# Patient Record
Sex: Female | Born: 1970 | Race: Black or African American | Hispanic: No | Marital: Married | Smoking: Never smoker
Health system: Southern US, Community
[De-identification: ages and names within clinical notes are randomized; demographics above are authoritative.]

## PROBLEM LIST (undated history)

## (undated) DIAGNOSIS — E215 Disorder of parathyroid gland, unspecified: Secondary | ICD-10-CM

## (undated) DIAGNOSIS — G939 Disorder of brain, unspecified: Secondary | ICD-10-CM

## (undated) DIAGNOSIS — D649 Anemia, unspecified: Secondary | ICD-10-CM

## (undated) DIAGNOSIS — K219 Gastro-esophageal reflux disease without esophagitis: Secondary | ICD-10-CM

## (undated) DIAGNOSIS — R519 Headache, unspecified: Secondary | ICD-10-CM

## (undated) DIAGNOSIS — R200 Anesthesia of skin: Secondary | ICD-10-CM

## (undated) DIAGNOSIS — E213 Hyperparathyroidism, unspecified: Secondary | ICD-10-CM

## (undated) DIAGNOSIS — F419 Anxiety disorder, unspecified: Secondary | ICD-10-CM

## (undated) DIAGNOSIS — E785 Hyperlipidemia, unspecified: Secondary | ICD-10-CM

## (undated) HISTORY — PX: WISDOM TOOTH EXTRACTION: SHX21

## (undated) HISTORY — PX: UPPER GI ENDOSCOPY: SHX6162

## (undated) HISTORY — DX: Anemia, unspecified: D64.9

## (undated) HISTORY — DX: Hyperlipidemia, unspecified: E78.5

## (undated) HISTORY — PX: COLONOSCOPY: SHX174

---
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
## 1999-12-13 ENCOUNTER — Emergency Department (HOSPITAL_COMMUNITY): Admission: EM | Admit: 1999-12-13 | Discharge: 1999-12-13 | Payer: Self-pay | Admitting: Emergency Medicine

## 1999-12-17 ENCOUNTER — Emergency Department (HOSPITAL_COMMUNITY): Admission: EM | Admit: 1999-12-17 | Discharge: 1999-12-17 | Payer: Self-pay

## 2001-02-08 ENCOUNTER — Other Ambulatory Visit: Admission: RE | Admit: 2001-02-08 | Discharge: 2001-02-08 | Payer: Self-pay | Admitting: Obstetrics and Gynecology

## 2001-02-27 ENCOUNTER — Encounter: Admission: RE | Admit: 2001-02-27 | Discharge: 2001-02-27 | Payer: Self-pay | Admitting: Obstetrics and Gynecology

## 2001-02-27 ENCOUNTER — Encounter: Payer: Self-pay | Admitting: Obstetrics and Gynecology

## 2002-04-19 ENCOUNTER — Other Ambulatory Visit: Admission: RE | Admit: 2002-04-19 | Discharge: 2002-04-19 | Payer: Self-pay | Admitting: Obstetrics and Gynecology

## 2003-04-02 ENCOUNTER — Emergency Department (HOSPITAL_COMMUNITY): Admission: EM | Admit: 2003-04-02 | Discharge: 2003-04-02 | Payer: Self-pay | Admitting: Emergency Medicine

## 2003-04-03 ENCOUNTER — Encounter: Payer: Self-pay | Admitting: Emergency Medicine

## 2003-04-24 ENCOUNTER — Other Ambulatory Visit: Admission: RE | Admit: 2003-04-24 | Discharge: 2003-04-24 | Payer: Self-pay | Admitting: Obstetrics and Gynecology

## 2004-05-13 ENCOUNTER — Other Ambulatory Visit: Admission: RE | Admit: 2004-05-13 | Discharge: 2004-05-13 | Payer: Self-pay | Admitting: Obstetrics and Gynecology

## 2014-12-20 ENCOUNTER — Ambulatory Visit (INDEPENDENT_AMBULATORY_CARE_PROVIDER_SITE_OTHER): Payer: 59 | Admitting: Family Medicine

## 2014-12-20 ENCOUNTER — Encounter: Payer: Self-pay | Admitting: Family Medicine

## 2014-12-20 VITALS — BP 119/78 | HR 85 | Temp 97.9°F | Resp 16 | Ht 64.5 in | Wt 170.0 lb

## 2014-12-20 DIAGNOSIS — R079 Chest pain, unspecified: Secondary | ICD-10-CM

## 2014-12-20 DIAGNOSIS — D509 Iron deficiency anemia, unspecified: Secondary | ICD-10-CM

## 2014-12-20 DIAGNOSIS — Z862 Personal history of diseases of the blood and blood-forming organs and certain disorders involving the immune mechanism: Secondary | ICD-10-CM | POA: Insufficient documentation

## 2014-12-20 DIAGNOSIS — K219 Gastro-esophageal reflux disease without esophagitis: Secondary | ICD-10-CM

## 2014-12-20 DIAGNOSIS — D259 Leiomyoma of uterus, unspecified: Secondary | ICD-10-CM

## 2014-12-20 DIAGNOSIS — R5383 Other fatigue: Secondary | ICD-10-CM

## 2014-12-20 LAB — COMPLETE METABOLIC PANEL WITH GFR
ALT: 12 U/L (ref 0–35)
AST: 13 U/L (ref 0–37)
Albumin: 4 g/dL (ref 3.5–5.2)
Alkaline Phosphatase: 67 U/L (ref 39–117)
BUN: 8 mg/dL (ref 6–23)
CO2: 24 mEq/L (ref 19–32)
Calcium: 10.3 mg/dL (ref 8.4–10.5)
Chloride: 106 mEq/L (ref 96–112)
Creat: 0.98 mg/dL (ref 0.50–1.10)
GFR, Est African American: 82 mL/min
GFR, Est Non African American: 71 mL/min
Glucose, Bld: 80 mg/dL (ref 70–99)
Potassium: 4.9 mEq/L (ref 3.5–5.3)
Sodium: 137 mEq/L (ref 135–145)
Total Bilirubin: 0.3 mg/dL (ref 0.2–1.2)
Total Protein: 7.1 g/dL (ref 6.0–8.3)

## 2014-12-20 LAB — POCT CBC
Granulocyte percent: 64 %G (ref 37–80)
HCT, POC: 33.2 % — AB (ref 37.7–47.9)
Hemoglobin: 10 g/dL — AB (ref 12.2–16.2)
Lymph, poc: 1.3 (ref 0.6–3.4)
MCH, POC: 22.8 pg — AB (ref 27–31.2)
MCHC: 30.3 g/dL — AB (ref 31.8–35.4)
MCV: 75.3 fL — AB (ref 80–97)
MID (cbc): 0.1 (ref 0–0.9)
MPV: 7.2 fL (ref 0–99.8)
POC Granulocyte: 2.6 (ref 2–6.9)
POC LYMPH PERCENT: 32.9 %L (ref 10–50)
POC MID %: 3.1 %M (ref 0–12)
Platelet Count, POC: 333 10*3/uL (ref 142–424)
RBC: 4.4 M/uL (ref 4.04–5.48)
RDW, POC: 16.4 %
WBC: 4 10*3/uL — AB (ref 4.6–10.2)

## 2014-12-20 MED ORDER — POLYSACCHARIDE IRON COMPLEX 150 MG PO CAPS: 150.0000 mg | ORAL_CAPSULE | Freq: Every day | ORAL | Status: DC

## 2014-12-20 MED ORDER — TRAMADOL HCL 50 MG PO TABS: 50.0000 mg | ORAL_TABLET | Freq: Three times a day (TID) | ORAL | Status: DC | PRN

## 2014-12-20 MED ORDER — OMEPRAZOLE 20 MG PO CPDR: 20.0000 mg | DELAYED_RELEASE_CAPSULE | Freq: Every day | ORAL | Status: DC

## 2014-12-20 NOTE — Progress Notes (Signed)
Subjective:    Patient ID: Ana Freeman, female    DOB: September 07, 1971, 44 y.o.   MRN: [id number]  HPI  This 44 y.o. Female is new to [facility]; she recently moved to [place] from [place]. PCP in [place] was seen for 1-2 visits. Pt has hx of anemia due to uterine fibroids (total = 4?) and was advised to have further evaluation. Iron supplementation was advised but she has not been taking iron; c/o GI upset with OTC product. Pt did not follow through w/ this recommendation. Today, she c/o palpitations and occasional CP (L anterior chest w/ radiation into arm), SOB and DOE. Onset > 3 months ago. Pt exercises regularly (ZUMBA) but is fatigued afterwards; she pushes through the work-out despite lack of energy or stamina.  Menses are abnormal, lasting 7-9 days w/ heavy bleeding x 3-4 days. She c/o urinary frequency and urgency w/ bladder pressure due to fibroids. She has never been pregnant. No family hx of uterine disease/cancer.  Pt also has significant hx of GERD, with ulcer in remote past. 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 She denies anorexia, abnormal weight loss, n/v, change in stools, melena or hematochezia.  Patient Active Problem List   Diagnosis Date Noted  . Anemia, iron deficiency 12/20/2014    MEDICATIONS, PMHx, SURG Hx reviewed.  History   Social History  . Marital Status: Single    Spouse Name: N/A    Number of Children: N/A  . Years of Education: N/A   Occupational History  . Currently not employed.   Social History Main Topics  . Smoking status: Never Smoker   . Smokeless tobacco: Not on file  . Alcohol Use: 0.0 oz/week    0 Not specified per week  . Drug Use: No  . Sexual Activity: No   Other Topics Concern  . Not on file   Social History Narrative   Student; lives with mother    Family History  Problem Relation Age of Onset  . Hypertension Mother   . Diabetes Father   . Anemia Sister       Review of Systems  Constitutional: Positive for fatigue. Negative for fever, diaphoresis, activity change, appetite change and unexpected weight change.  HENT: Negative.   Eyes: Negative.   Respiratory: Positive for chest tightness and shortness of breath. Negative for cough and wheezing.   Cardiovascular: Positive for chest pain and palpitations. Negative for leg swelling.  Gastrointestinal: Negative.   Genitourinary: Positive for urgency, frequency, menstrual problem and pelvic pain. Negative for dysuria, hematuria, decreased urine volume, enuresis, difficulty urinating and vaginal pain.  Neurological: Positive for light-headedness. Negative for dizziness, syncope and headaches.  Psychiatric/Behavioral: Negative.        Objective:   Physical Exam  Constitutional: She is oriented to person, place, and time. She appears well-developed and well-nourished. No distress.  HENT:  Head: Normocephalic and atraumatic.  Right Ear: External ear normal.  Left Ear: External ear normal.  Nose: No nasal deformity or septal deviation.  Mouth/Throat: Uvula is midline, oropharynx is clear and moist and mucous membranes are normal. No oral lesions. No dental caries.  Eyes: Conjunctivae, EOM and lids are normal. Pupils are equal, round, and reactive to light. No scleral icterus.  Neck: Trachea normal, normal range of motion, full passive range of motion without pain and phonation normal. Neck supple. No thyroid mass and no thyromegaly present.  Cardiovascular: Normal rate,  regular rhythm, S1 normal, S2 normal, normal heart sounds and normal pulses.   No extrasystoles are present. PMI is not displaced.  Exam reveals no gallop.   No murmur heard. Pulmonary/Chest: Breath sounds normal. No respiratory distress.  Abdominal: Soft. Normal appearance and bowel sounds are normal. She exhibits mass. She exhibits no distension. There is no hepatosplenomegaly. There is tenderness in the suprapubic area. There is  no guarding and no CVA tenderness.  Uterus palpable at umbilicus; firm w/o irregularity.  Musculoskeletal: Normal range of motion. She exhibits no edema or tenderness.  Neurological: She is alert and oriented to person, place, and time. No cranial nerve deficit. Coordination normal.  Skin: Skin is warm and dry. No rash noted. She is not diaphoretic. No erythema.  Psychiatric: She has a normal mood and affect. Her behavior is normal. Judgment and thought content normal.  Nursing note and vitals reviewed.   Results for orders placed or performed in visit on 12/20/14  POCT CBC  Result Value Ref Range   WBC 4.0 (A) 4.6 - 10.2 K/uL   Lymph, poc 1.3 0.6 - 3.4   POC LYMPH PERCENT 32.9 10 - 50 %L   MID (cbc) 0.1 0 - 0.9   POC MID % 3.1 0 - 12 %M   POC Granulocyte 2.6 2 - 6.9   Granulocyte percent 64.0 37 - 80 %G   RBC 4.40 4.04 - 5.48 M/uL   Hemoglobin 10.0 (A) 12.2 - 16.2 g/dL   HCT, POC 33.2 (A) 37.7 - 47.9 %   MCV 75.3 (A) 80 - 97 fL   MCH, POC 22.8 (A) 27 - 31.2 pg   MCHC 30.3 (A) 31.8 - 35.4 g/dL   RDW, POC 16.4 %   Platelet Count, POC 333 142 - 424 K/uL   MPV 7.2 0 - 99.8 fL    ECG: NSR; negative T waves >> anterior ischemia.     Assessment & Plan:  Anemia, iron deficiency - GI-friendly iron supplement prescribed (Niferex 150 mg 1 cap daily) Plan: Ambulatory referral to Gynecology, POCT CBC, COMPLETE METABOLIC PANEL WITH GFR  Chest pain, unspecified chest pain type - Plan: EKG 12-Lead, TSH, T4, free, Ambulatory referral to Cardiology  Other fatigue - Plan: POCT CBC, TSH, T4, free, COMPLETE METABOLIC PANEL WITH GFR, Ambulatory referral to Cardiology  Uterine leiomyoma, unspecified location - Plan: Ambulatory referral to Gynecology  Gastroesophageal reflux disease, esophagitis presence not specified- Discontinue all NSAIDS! Trial PPI- omeprazole 20 mg daily.  Meds ordered this encounter  Medications  . iron polysaccharides (NIFEREX) 150 MG capsule    Sig: Take 1 capsule  (150 mg total) by mouth daily.    Dispense:  30 capsule    Refill:  3  . omeprazole (PRILOSEC) 20 MG capsule    Sig: Take 1 capsule (20 mg total) by mouth daily.    Dispense:  30 capsule    Refill:  3  . traMADol (ULTRAM) 50 MG tablet    Sig: Take 1 tablet (50 mg total) by mouth every 8 (eight) hours as needed.    Dispense:  40 tablet    Refill:  1

## 2014-12-20 NOTE — Patient Instructions (Signed)
Anemia, Nonspecific Anemia is a condition in which the concentration of red blood cells or hemoglobin in the blood is below normal. Hemoglobin is a substance in red blood cells that carries oxygen to the tissues of the body. Anemia results in not enough oxygen reaching these tissues.  CAUSES  Common causes of anemia include:   Excessive bleeding. Bleeding may be internal or external. This includes excessive bleeding from periods (in women) or from the intestine.   Poor nutrition.   Chronic kidney, thyroid, and liver disease.  Bone marrow disorders that decrease red blood cell production.  Cancer and treatments for cancer.  HIV, AIDS, and their treatments.  Spleen problems that increase red blood cell destruction.  Blood disorders.  Excess destruction of red blood cells due to infection, medicines, and autoimmune disorders. SIGNS AND SYMPTOMS   Minor weakness.   Dizziness.   Headache.  Palpitations.   Shortness of breath, especially with exercise.   Paleness.  Cold sensitivity.  Indigestion.  Nausea.  Difficulty sleeping.  Difficulty concentrating. Symptoms may occur suddenly or they may develop slowly.  DIAGNOSIS  Additional blood tests are often needed. These help your health care provider determine the best treatment. Your health care provider will check your stool for blood and look for other causes of blood loss.  TREATMENT  Treatment varies depending on the cause of the anemia. Treatment can include:   Supplements of iron, vitamin I96, or folic acid.   Hormone medicines.   A blood transfusion. This may be needed if blood loss is severe.   Hospitalization. This may be needed if there is significant continual blood loss.   Dietary changes.  Spleen removal. HOME CARE INSTRUCTIONS Keep all follow-up appointments. It often takes many weeks to correct anemia, and having your health care provider check on your condition and your response to  treatment is very important. SEEK IMMEDIATE MEDICAL CARE IF:   You develop extreme weakness, shortness of breath, or chest pain.   You become dizzy or have trouble concentrating.  You develop heavy vaginal bleeding.   You develop a rash.   You have bloody or black, tarry stools.   You faint.   You vomit up blood.   You vomit repeatedly.   You have abdominal pain.  You have a fever or persistent symptoms for more than 2-3 days.   You have a fever and your symptoms suddenly get worse.   You are dehydrated.  MAKE SURE YOU:  Understand these instructions.  Will watch your condition.  Will get help right away if you are not doing well or get worse. Document Released: 01/06/2005 Document Revised: 08/01/2013 Document Reviewed: 05/25/2013 [facility] Patient Information 2015 [place], [place]. This information is not intended to replace advice given to you by your health care provider. Make sure you discuss any questions you have with your health care provider.   Food Choices for Gastroesophageal Reflux Disease When you have gastroesophageal reflux disease (GERD), the foods you eat and your eating habits are very important. Choosing the right foods can help ease the discomfort of GERD. WHAT GENERAL GUIDELINES DO I NEED TO FOLLOW?  Choose fruits, vegetables, whole grains, low-fat dairy products, and low-fat meat, fish, and poultry.  Limit fats such as oils, salad dressings, butter, nuts, and avocado.  Keep a food diary to identify foods that cause symptoms.  Avoid foods that cause reflux. These may be different for different people.  Eat frequent small meals instead of three large meals each day.  Eat your meals slowly, in a relaxed setting.  Limit fried foods.  Cook foods using methods other than frying.  Avoid drinking alcohol.  Avoid drinking large amounts of liquids with your meals.  Avoid bending over or lying down until 2-3 hours after eating. WHAT  FOODS ARE NOT RECOMMENDED? The following are some foods and drinks that may worsen your symptoms: Vegetables Tomatoes. Tomato juice. Tomato and spaghetti sauce. Chili peppers. Onion and garlic. Horseradish. Fruits Oranges, grapefruit, and lemon (fruit and juice). Meats High-fat meats, fish, and poultry. This includes hot dogs, ribs, ham, sausage, salami, and bacon. Dairy Whole milk and chocolate milk. Sour cream. Cream. Butter. Ice cream. Cream cheese.  Beverages Coffee and tea, with or without caffeine. Carbonated beverages or energy drinks. Condiments Hot sauce. Barbecue sauce.  Sweets/Desserts Chocolate and cocoa. Donuts. Peppermint and spearmint. Fats and Oils High-fat foods, including [place] fries and potato chips. Other Vinegar. Strong spices, such as black pepper, white pepper, red pepper, cayenne, curry powder, cloves, ginger, and chili powder. The items listed above may not be a complete list of foods and beverages to avoid. Contact your dietitian for more information. Document Released: 11/29/2005 Document Revised: 12/04/2013 Document Reviewed: 10/03/2013 [facility] Patient Information 2015 [place], [place]. This information is not intended to replace advice given to you by your health care provider. Make sure you discuss any questions you have with your health care provider.   Discontinue any medications like Advil, Ibuprofen or Aleve. These medications worsen reflux and can cuase irritation of the esophagus and stomach. This irritation can cause chest pain and worse anemia.

## 2014-12-21 LAB — TSH: TSH: 0.737 u[IU]/mL (ref 0.350–4.500)

## 2014-12-21 LAB — T4, FREE: Free T4: 1.37 ng/dL (ref 0.80–1.80)

## 2014-12-22 NOTE — Progress Notes (Signed)
Quick Note:  Please notify pt that results are normal.   Provide pt with copy of labs. ______ 

## 2015-01-17 ENCOUNTER — Ambulatory Visit (INDEPENDENT_AMBULATORY_CARE_PROVIDER_SITE_OTHER): Payer: 59 | Admitting: Family Medicine

## 2015-01-17 ENCOUNTER — Encounter: Payer: Self-pay | Admitting: Family Medicine

## 2015-01-17 VITALS — BP 116/60 | HR 73 | Temp 98.3°F | Resp 16 | Ht 65.0 in | Wt 174.0 lb

## 2015-01-17 DIAGNOSIS — D509 Iron deficiency anemia, unspecified: Secondary | ICD-10-CM

## 2015-01-17 DIAGNOSIS — D259 Leiomyoma of uterus, unspecified: Secondary | ICD-10-CM | POA: Insufficient documentation

## 2015-01-17 DIAGNOSIS — Z13220 Encounter for screening for lipoid disorders: Secondary | ICD-10-CM

## 2015-01-17 DIAGNOSIS — R202 Paresthesia of skin: Secondary | ICD-10-CM

## 2015-01-17 LAB — LIPID PANEL
Cholesterol: 184 mg/dL (ref 0–200)
HDL: 60 mg/dL (ref >39)
LDL Cholesterol: 113 mg/dL — ABNORMAL HIGH (ref 0–99)
Total CHOL/HDL Ratio: 3.1 Ratio
Triglycerides: 57 mg/dL (ref <150)
VLDL: 11 mg/dL (ref 0–40)

## 2015-01-17 LAB — CBC
HCT: 32 % — ABNORMAL LOW (ref 36.0–46.0)
Hemoglobin: 9.5 g/dL — ABNORMAL LOW (ref 12.0–15.0)
MCH: 22.6 pg — ABNORMAL LOW (ref 26.0–34.0)
MCHC: 29.7 g/dL — ABNORMAL LOW (ref 30.0–36.0)
MCV: 76.2 fL — ABNORMAL LOW (ref 78.0–100.0)
MPV: 9.2 fL (ref 8.6–12.4)
Platelets: 316 10*3/uL (ref 150–400)
RBC: 4.2 MIL/uL (ref 3.87–5.11)
RDW: 16.6 % — ABNORMAL HIGH (ref 11.5–15.5)
WBC: 3.4 10*3/uL — ABNORMAL LOW (ref 4.0–10.5)

## 2015-01-17 NOTE — Patient Instructions (Signed)
Your labs will be available in 4-5 days. Follow-up with Dr. Irven Shelling office as directed. Our staff will be contacting you with the details of GYN appointment.

## 2015-01-17 NOTE — Progress Notes (Signed)
S:  This 44 y.o. Female returns for anemia follow-up. Iron-def anemia due to fibroid uterus; pt has not been contacted about GYN appt. She has hx of atypical CP, evaluated last month by Dr. Einar Gip. She will be scheduled for ECHO and stress test. Pt needs to have lipid panel per Dr. Einar Gip. Pt is tolerating oral iron supplement w/o GI upset. She feels better and has more energy. Last menstrual cycle was shorter (5 days) and much less painful.; pt is hopeful that fibroid may be shrinking.  Pt still c/o L arm and leg paresthesias.  Patient Active Problem List   Diagnosis Date Noted  . Fibroid uterus 01/17/2015  . Anemia, iron deficiency 12/20/2014    Prior to Admission medications   Medication Sig Start Date End Date Taking? Authorizing Provider  iron polysaccharides (NIFEREX) 150 MG capsule Take 1 capsule (150 mg total) by mouth daily. 12/20/14  Yes Barton Fanny, MD  omeprazole (PRILOSEC) 20 MG capsule Take 1 capsule (20 mg total) by mouth daily. 12/20/14  Yes Barton Fanny, MD    Past Surgical History  Procedure Laterality Date  . Wisdom tooth extraction      ROS: As per HPI; negative for anorexia, diaphoresis, fatigue, CP or tightness, palpitations, SOB or DOE, abd pain, hematochezia or melena, HA, dizziness, weakness or syncope.  O: Filed Vitals:   01/17/15 1128  BP: 116/60  Pulse: 73  Temp: 98.3 F (36.8 C)  Resp: 16   GEN: in NAD; WN,WD. HENT: [place]/AT; EOMI w/ clear conj/sclerae. Moist oral mucosa. COR: RRR. No edema. LUNGS: Unlabored resp. SKIN: W&D; intact w/o erythema or jaundice. MS: MAEs; no deformities or atrophy. NEURO: A&O x 3; CNs intact. Nonfocal.  A/P: Anemia, iron deficiency - Continue iron supplement. Awaiting GYN referral for eval of fibroids.  Plan: CBC  Paresthesia - Plan: Vitamin D, 25-hydroxy  Screening for lipid disorders - Plan: Lipid panel

## 2015-01-18 LAB — VITAMIN D 25 HYDROXY (VIT D DEFICIENCY, FRACTURES): Vit D, 25-Hydroxy: 36 ng/mL (ref 30–100)

## 2015-01-19 ENCOUNTER — Encounter: Payer: Self-pay | Admitting: Family Medicine

## 2015-01-19 ENCOUNTER — Telehealth: Payer: Self-pay | Admitting: Family Medicine

## 2015-01-19 NOTE — Telephone Encounter (Signed)
I phoned pt and left a message to advise her to take iron supplement twice daily to help correct anemia.

## 2015-01-19 NOTE — Progress Notes (Signed)
Quick Note:  Please send pt copy of labs...  All labs are normal except anemia has not improved. Take iron supplement twice a day. Have your pharmacy contact us for early refill on iron prescription; you will run out of the medication sooner because of twice daily dosing.  Contact clinic if you have questions.  ______

## 2015-01-20 ENCOUNTER — Encounter: Payer: Self-pay | Admitting: Family Medicine

## 2015-02-11 ENCOUNTER — Encounter: Payer: Self-pay | Admitting: Family Medicine

## 2015-02-11 DIAGNOSIS — R9431 Abnormal electrocardiogram [ECG] [EKG]: Secondary | ICD-10-CM | POA: Insufficient documentation

## 2015-02-14 ENCOUNTER — Telehealth: Payer: Self-pay

## 2015-02-14 NOTE — Telephone Encounter (Signed)
Patient faxed a [facility] PE form for Dr. Leward Quan to complete. Form forwarded to 104.

## 2015-02-19 NOTE — Telephone Encounter (Signed)
Form completed and pt contacted.

## 2015-02-19 NOTE — Telephone Encounter (Signed)
Forms were given to me by Dr. Leward Quan. I faxed over the forms as instructed on the completion form. I left a voicemail stating I faxed the forms as requested. I will place the forms in the scanning box at 104.

## 2015-03-10 ENCOUNTER — Encounter: Payer: Self-pay | Admitting: Family Medicine

## 2015-04-05 ENCOUNTER — Other Ambulatory Visit: Payer: Self-pay | Admitting: Family Medicine

## 2015-05-26 ENCOUNTER — Ambulatory Visit (INDEPENDENT_AMBULATORY_CARE_PROVIDER_SITE_OTHER): Payer: 59 | Admitting: Family Medicine

## 2015-05-26 VITALS — BP 124/80 | HR 90 | Temp 99.3°F | Resp 18 | Ht 65.0 in | Wt 175.4 lb

## 2015-05-26 DIAGNOSIS — J0100 Acute maxillary sinusitis, unspecified: Secondary | ICD-10-CM | POA: Diagnosis not present

## 2015-05-26 DIAGNOSIS — H109 Unspecified conjunctivitis: Secondary | ICD-10-CM

## 2015-05-26 MED ORDER — AMOXICILLIN 875 MG PO TABS: 875.0000 mg | ORAL_TABLET | Freq: Two times a day (BID) | ORAL | Status: DC

## 2015-05-26 MED ORDER — TOBRAMYCIN 0.3 % OP SOLN: 1.0000 [drp] | Freq: Four times a day (QID) | OPHTHALMIC | Status: DC

## 2015-05-26 NOTE — Patient Instructions (Signed)
Bacterial Conjunctivitis Bacterial conjunctivitis, commonly called pink eye, is an inflammation of the clear membrane that covers the white part of the eye (conjunctiva). The inflammation can also happen on the underside of the eyelids. The blood vessels in the conjunctiva become inflamed, causing the eye to become red or pink. Bacterial conjunctivitis may spread easily from one eye to another and from person to person (contagious).  CAUSES  Bacterial conjunctivitis is caused by bacteria. The bacteria may come from your own skin, your upper respiratory tract, or from someone else with bacterial conjunctivitis. SYMPTOMS  The normally white color of the eye or the underside of the eyelid is usually pink or red. The pink eye is usually associated with irritation, tearing, and some sensitivity to light. Bacterial conjunctivitis is often associated with a thick, yellowish discharge from the eye. The discharge may turn into a crust on the eyelids overnight, which causes your eyelids to stick together. If a discharge is present, there may also be some blurred vision in the affected eye. DIAGNOSIS  Bacterial conjunctivitis is diagnosed by your caregiver through an eye exam and the symptoms that you report. Your caregiver looks for changes in the surface tissues of your eyes, which may point to the specific type of conjunctivitis. A sample of any discharge may be collected on a cotton-tip swab if you have a severe case of conjunctivitis, if your cornea is affected, or if you keep getting repeat infections that do not respond to treatment. The sample will be sent to a lab to see if the inflammation is caused by a bacterial infection and to see if the infection will respond to antibiotic medicines. TREATMENT   Bacterial conjunctivitis is treated with antibiotics. Antibiotic eyedrops are most often used. However, antibiotic ointments are also available. Antibiotics pills are sometimes used. Artificial tears or eye  washes may ease discomfort. HOME CARE INSTRUCTIONS   To ease discomfort, apply a cool, clean washcloth to your eye for 10-20 minutes, 3-4 times a day.  Gently wipe away any drainage from your eye with a warm, wet washcloth or a cotton ball.  Wash your hands often with soap and water. Use paper towels to dry your hands.  Do not share towels or washcloths. This may spread the infection.  Change or wash your pillowcase every day.  You should not use eye makeup until the infection is gone.  Do not operate machinery or drive if your vision is blurred.  Stop using contact lenses. Ask your caregiver how to sterilize or replace your contacts before using them again. This depends on the type of contact lenses that you use.  When applying medicine to the infected eye, do not touch the edge of your eyelid with the eyedrop bottle or ointment tube. SEEK IMMEDIATE MEDICAL CARE IF:   Your infection has not improved within 3 days after beginning treatment.  You had yellow discharge from your eye and it returns.  You have increased eye pain.  Your eye redness is spreading.  Your vision becomes blurred.  You have a fever or persistent symptoms for more than 2-3 days.  You have a fever and your symptoms suddenly get worse.  You have facial pain, redness, or swelling. MAKE SURE YOU:   Understand these instructions.  Will watch your condition.  Will get help right away if you are not doing well or get worse. Document Released: 11/29/2005 Document Revised: 04/15/2014 Document Reviewed: 05/01/2012 [employer] Patient Information 2015 [employer]. This information is not intended to   replace advice given to you by your health care provider. Make sure you discuss any questions you have with your health care provider. Sinusitis Sinusitis is redness, soreness, and inflammation of the paranasal sinuses. Paranasal sinuses are air pockets within the bones of your face (beneath the eyes, the middle  of the forehead, or above the eyes). In healthy paranasal sinuses, mucus is able to drain out, and air is able to circulate through them by way of your nose. However, when your paranasal sinuses are inflamed, mucus and air can become trapped. This can allow bacteria and other germs to grow and cause infection. Sinusitis can develop quickly and last only a short time (acute) or continue over a long period (chronic). Sinusitis that lasts for more than 12 weeks is considered chronic.  CAUSES  Causes of sinusitis include:  Allergies.  Structural abnormalities, such as displacement of the cartilage that separates your nostrils (deviated septum), which can decrease the air flow through your nose and sinuses and affect sinus drainage.  Functional abnormalities, such as when the small hairs (cilia) that line your sinuses and help remove mucus do not work properly or are not present. SIGNS AND SYMPTOMS  Symptoms of acute and chronic sinusitis are the same. The primary symptoms are pain and pressure around the affected sinuses. Other symptoms include:  Upper toothache.  Earache.  Headache.  Bad breath.  Decreased sense of smell and taste.  A cough, which worsens when you are lying flat.  Fatigue.  Fever.  Thick drainage from your nose, which often is green and may contain pus (purulent).  Swelling and warmth over the affected sinuses. DIAGNOSIS  Your health care provider will perform a physical exam. During the exam, your health care provider may:  Look in your nose for signs of abnormal growths in your nostrils (nasal polyps).  Tap over the affected sinus to check for signs of infection.  View the inside of your sinuses (endoscopy) using an imaging device that has a light attached (endoscope). If your health care provider suspects that you have chronic sinusitis, one or more of the following tests may be recommended:  Allergy tests.  Nasal culture. A sample of mucus is taken from  your nose, sent to a lab, and screened for bacteria.  Nasal cytology. A sample of mucus is taken from your nose and examined by your health care provider to determine if your sinusitis is related to an allergy. TREATMENT  Most cases of acute sinusitis are related to a viral infection and will resolve on their own within 10 days. Sometimes medicines are prescribed to help relieve symptoms (pain medicine, decongestants, nasal steroid sprays, or saline sprays).  However, for sinusitis related to a bacterial infection, your health care provider will prescribe antibiotic medicines. These are medicines that will help kill the bacteria causing the infection.  Rarely, sinusitis is caused by a fungal infection. In theses cases, your health care provider will prescribe antifungal medicine. For some cases of chronic sinusitis, surgery is needed. Generally, these are cases in which sinusitis recurs more than 3 times per year, despite other treatments. HOME CARE INSTRUCTIONS   Drink plenty of water. Water helps thin the mucus so your sinuses can drain more easily.  Use a humidifier.  Inhale steam 3 to 4 times a day (for example, sit in the bathroom with the shower running).  Apply a warm, moist washcloth to your face 3 to 4 times a day, or as directed by your health care provider.    Use saline nasal sprays to help moisten and clean your sinuses.  Take medicines only as directed by your health care provider.  If you were prescribed either an antibiotic or antifungal medicine, finish it all even if you start to feel better. SEEK IMMEDIATE MEDICAL CARE IF:  You have increasing pain or severe headaches.  You have nausea, vomiting, or drowsiness.  You have swelling around your face.  You have vision problems.  You have a stiff neck.  You have difficulty breathing. MAKE SURE YOU:   Understand these instructions.  Will watch your condition.  Will get help right away if you are not doing well or  get worse. Document Released: 11/29/2005 Document Revised: 04/15/2014 Document Reviewed: 12/14/2011 [employer] Patient Information 2015 [employer]. This information is not intended to replace advice given to you by your health care provider. Make sure you discuss any questions you have with your health care provider.  

## 2015-05-26 NOTE — Progress Notes (Signed)
Patient ID: Ana Freeman MRN: [id number], DOB: 07/19/1971, 44 y.o. Date of Encounter: 05/26/2015, 9:45 AM  Primary Physician: No primary care provider on file.  Chief Complaint:  Chief Complaint  Patient presents with  . Sinusitis    Unable to sleep due to nasal congestion x1 week  . Eye Drainage    Right eye    HPI: 44 y.o. year old female presents with 8 day history of nasal congestion, post nasal drip, sore throat, sinus pressure, and cough. Afebrile. No chills. Nasal congestion thick and green/yellow. Sinus pressure is the worst symptom. Cough is productive secondary to post nasal drip and not associated with time of day. Ears feel full, leading to sensation of muffled hearing. Has tried OTC cold preps without success. No GI complaints.  Taking alka seltzer   No recent antibiotics, recent travels, vomiting, or sick contacts   No leg trauma, sedentary periods, h/o cancer, or tobacco use.  History reviewed. No pertinent past medical history.   Home Meds: Prior to Admission medications   Medication Sig Start Date End Date Taking? Authorizing Provider  iron polysaccharides (POLY-IRON 150) 150 MG capsule Take 1 capsule (150 mg total) by mouth 2 (two) times daily. Patient not taking: Reported on 05/26/2015 04/07/15   Barton Fanny, MD  omeprazole (PRILOSEC) 20 MG capsule Take 1 capsule (20 mg total) by mouth daily. Patient not taking: Reported on 05/26/2015 12/20/14   Barton Fanny, MD    Allergies: No Known Allergies  History   Social History  . Marital Status: Single    Spouse Name: N/A  . Number of Children: N/A  . Years of Education: N/A   Occupational History  . Not on file.   Social History Main Topics  . Smoking status: Never Smoker   . Smokeless tobacco: Not on file  . Alcohol Use: 0.0 oz/week    0 Standard drinks or equivalent per week  . Drug Use: No  . Sexual Activity: No   Other Topics Concern  . Not on file   Social History Narrative     Student; lives with mother     Review of Systems: Constitutional: negative for chills, fever, night sweats or weight changes Cardiovascular: negative for chest pain or palpitations Respiratory: negative for hemoptysis, wheezing, or shortness of breath Abdominal: negative for abdominal pain, nausea, vomiting or diarrhea Dermatological: negative for rash Neurologic: negative for headache   Physical Exam: Blood pressure 124/80, pulse 90, temperature 99.3 F (37.4 C), temperature source Oral, resp. rate 18, height 5\' 5"  (1.651 m), weight 175 lb 6.4 oz (79.561 kg), last menstrual period 05/08/2015, SpO2 98 %., Body mass index is 29.19 kg/(m^2). General: Well developed, well nourished, in no acute distress. Head: Normocephalic, atraumatic, right eye with injection and discharge, sclera non-icteric, nares are congested. Bilateral auditory canals clear, TM's are without perforation, pearly grey with reflective cone of light bilaterally. Serous effusion bilaterally behind TM's. Maxillary sinus TTP. Oral cavity moist, dentition normal. Posterior pharynx with post nasal drip and mild erythema. No peritonsillar abscess or tonsillar exudate. Neck: Supple. No thyromegaly. Full ROM. No lymphadenopathy. Lungs: Clear bilaterally to auscultation without wheezes, rales, or rhonchi. Breathing is unlabored.  Heart: RRR with S1 S2. No murmurs, rubs, or gallops appreciated. Msk:  Strength and tone normal for age. Extremities: No clubbing or cyanosis. No edema. Neuro: Alert and oriented X 3. Moves all extremities spontaneously. CNII-XII grossly in tact. Psych:  Responds to questions appropriately with a normal affect.  Labs:   ASSESSMENT AND PLAN:  44 y.o. year old female with sinusitis -    ICD-9-CM ICD-10-CM   1. Acute maxillary sinusitis, recurrence not specified 461.0 J01.00 amoxicillin (AMOXIL) 875 MG tablet  2. Conjunctivitis of right eye 372.30 H10.9 tobramycin (TOBREX) 0.3 % ophthalmic solution       -Tylenol/Motrin prn -Rest/fluids -RTC precautions -RTC 3-5 days if no improvement  Signed, Robyn Haber, MD 05/26/2015 9:45 AM

## 2015-10-06 ENCOUNTER — Other Ambulatory Visit: Payer: Self-pay | Admitting: Obstetrics and Gynecology

## 2015-10-06 DIAGNOSIS — N644 Mastodynia: Secondary | ICD-10-CM

## 2015-10-09 ENCOUNTER — Other Ambulatory Visit: Payer: Self-pay

## 2015-10-15 ENCOUNTER — Ambulatory Visit
Admission: RE | Admit: 2015-10-15 | Discharge: 2015-10-15 | Disposition: A | Discharge status: Home / Self Care | Payer: BC Managed Care – PPO | Source: Ambulatory Visit | Attending: Obstetrics and Gynecology | Admitting: Obstetrics and Gynecology

## 2015-10-15 DIAGNOSIS — N644 Mastodynia: Secondary | ICD-10-CM

## 2015-10-23 ENCOUNTER — Ambulatory Visit: Payer: Self-pay | Admitting: Internal Medicine

## 2015-10-27 ENCOUNTER — Ambulatory Visit (INDEPENDENT_AMBULATORY_CARE_PROVIDER_SITE_OTHER): Payer: BC Managed Care – PPO | Admitting: Internal Medicine

## 2015-10-27 ENCOUNTER — Encounter: Payer: Self-pay | Admitting: Internal Medicine

## 2015-10-27 ENCOUNTER — Other Ambulatory Visit (INDEPENDENT_AMBULATORY_CARE_PROVIDER_SITE_OTHER): Payer: BC Managed Care – PPO

## 2015-10-27 VITALS — BP 122/70 | HR 83 | Temp 98.7°F | Resp 12 | Ht 64.0 in | Wt 184.0 lb

## 2015-10-27 DIAGNOSIS — L709 Acne, unspecified: Secondary | ICD-10-CM

## 2015-10-27 DIAGNOSIS — L659 Nonscarring hair loss, unspecified: Secondary | ICD-10-CM

## 2015-10-27 DIAGNOSIS — K219 Gastro-esophageal reflux disease without esophagitis: Secondary | ICD-10-CM | POA: Diagnosis not present

## 2015-10-27 DIAGNOSIS — Z0000 Encounter for general adult medical examination without abnormal findings: Secondary | ICD-10-CM | POA: Diagnosis not present

## 2015-10-27 DIAGNOSIS — D509 Iron deficiency anemia, unspecified: Secondary | ICD-10-CM

## 2015-10-27 LAB — COMPREHENSIVE METABOLIC PANEL
ALT: 11 U/L (ref 0–35)
AST: 12 U/L (ref 0–37)
Albumin: 4 g/dL (ref 3.5–5.2)
Alkaline Phosphatase: 68 U/L (ref 39–117)
BUN: 12 mg/dL (ref 6–23)
CO2: 26 mEq/L (ref 19–32)
Calcium: 11.1 mg/dL — ABNORMAL HIGH (ref 8.4–10.5)
Chloride: 108 mEq/L (ref 96–112)
Creatinine, Ser: 0.94 mg/dL (ref 0.40–1.20)
GFR: 83.61 mL/min (ref >60.00)
Glucose, Bld: 105 mg/dL — ABNORMAL HIGH (ref 70–99)
Potassium: 4.5 mEq/L (ref 3.5–5.1)
Sodium: 139 mEq/L (ref 135–145)
Total Bilirubin: 0.3 mg/dL (ref 0.2–1.2)
Total Protein: 7.3 g/dL (ref 6.0–8.3)

## 2015-10-27 LAB — FERRITIN: Ferritin: 4.8 ng/mL — ABNORMAL LOW (ref 10.0–291.0)

## 2015-10-27 LAB — CBC
HCT: 30.5 % — ABNORMAL LOW (ref 36.0–46.0)
Hemoglobin: 9.5 g/dL — ABNORMAL LOW (ref 12.0–15.0)
MCHC: 31.2 g/dL (ref 30.0–36.0)
MCV: 74.6 fl — ABNORMAL LOW (ref 78.0–100.0)
Platelets: 370 10*3/uL (ref 150.0–400.0)
RBC: 4.09 Mil/uL (ref 3.87–5.11)
RDW: 18.2 % — ABNORMAL HIGH (ref 11.5–15.5)
WBC: 4.2 10*3/uL (ref 4.0–10.5)

## 2015-10-27 LAB — HEMOGLOBIN A1C: Hgb A1c MFr Bld: 5.4 % (ref 4.6–6.5)

## 2015-10-27 LAB — LIPID PANEL
Cholesterol: 176 mg/dL (ref 0–200)
HDL: 51.9 mg/dL (ref >39.00)
LDL Cholesterol: 109 mg/dL — ABNORMAL HIGH (ref 0–99)
NonHDL: 123.91
Total CHOL/HDL Ratio: 3
Triglycerides: 76 mg/dL (ref 0.0–149.0)
VLDL: 15.2 mg/dL (ref 0.0–40.0)

## 2015-10-27 LAB — TSH: TSH: 0.72 u[IU]/mL (ref 0.35–4.50)

## 2015-10-27 LAB — HIV ANTIBODY (ROUTINE TESTING W REFLEX): HIV 1&2 Ab, 4th Generation: NONREACTIVE

## 2015-10-27 MED ORDER — SULFACETAMIDE SODIUM 9.8 % EX LOTN: TOPICAL_LOTION | CUTANEOUS | Status: DC

## 2015-10-27 NOTE — Progress Notes (Signed)
Pre visit review using our clinic review tool, if applicable. No additional management support is needed unless otherwise documented below in the visit note. 

## 2015-10-27 NOTE — Patient Instructions (Signed)
We have sent in the lotion for the acne, apply to areas with acne and leave for about 5 minutes then rinse off. Use daily while acne active then you can go to a couple times per week.  We are checking the blood work today including the HIV screening.   A vitamin that helps with nail and hair strength is called biotin and is in the drug stores over the counter which might help.

## 2015-10-28 DIAGNOSIS — L659 Nonscarring hair loss, unspecified: Secondary | ICD-10-CM | POA: Insufficient documentation

## 2015-10-28 DIAGNOSIS — K219 Gastro-esophageal reflux disease without esophagitis: Secondary | ICD-10-CM | POA: Insufficient documentation

## 2015-10-28 DIAGNOSIS — L709 Acne, unspecified: Secondary | ICD-10-CM | POA: Insufficient documentation

## 2015-10-28 NOTE — Assessment & Plan Note (Signed)
Checking iron, B12, TSH for cause of the hair thinning. With her history of heavy bleeding would be concerned most about iron deficiency.

## 2015-10-28 NOTE — Assessment & Plan Note (Signed)
Checking CBC and ferritin and B12 today. She will likely be anemic since she is having severe fibroids and not taking iron supplement. She is getting a fibroidectomy soon and hopefully this will help. I have talked to her about the fact that her low iron levels could be causing the hair changes.

## 2015-10-28 NOTE — Assessment & Plan Note (Signed)
Rx for sulfacetamide lotion for the acne. She does have some on the back and the chest and minimal on the face. Discussed dermatology referral and she is open to that if this is not effective.

## 2015-10-28 NOTE — Progress Notes (Signed)
   Subjective:    Patient ID: Ana Freeman, female    DOB: 1971/09/17, 44 y.o.   MRN: [id number]  HPI The patient is a new 44 YO female coming in for several concerns. She does have fibroids which cause her iron deficiency. She has not been taking her iron pills lately (they constipated her). She is worried about her levels. Denies SOB or fatigue.  Next concern is her thinning hair. 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 She stopped doing as many hair treatments but feels it has not come back like she thinks it should. No pain and no rash on her head. She does have extensions right now.  Next concern is her acne. She has suffered with acne for some time and they are on her face, chest, back. Was on creams and pills in the past for it. Not terrible right now but has some scars that are darker which concerns her.   PMH, [facility], social history reviewed and updated.   Review of Systems  Constitutional: Negative for fever, activity change, appetite change, fatigue and unexpected weight change.  HENT: Negative for congestion, ear pain, postnasal drip, rhinorrhea and sinus pressure.        Acne  Eyes: Negative.   Respiratory: Negative for cough, chest tightness, shortness of breath and wheezing.   Cardiovascular: Negative for chest pain, palpitations and leg swelling.  Gastrointestinal: Negative for nausea, abdominal pain, diarrhea, constipation and abdominal distention.  Genitourinary:       Menorrhagia  Musculoskeletal: Negative.   Skin: Positive for color change.       acne  Neurological: Negative.   Psychiatric/Behavioral: Negative.       Objective:   Physical Exam  Constitutional: She is oriented to person, place, and time. She appears well-developed and well-nourished.  Overweight  HENT:  Head: Normocephalic and atraumatic.  Hair with some thinning along the center  Eyes: EOM are normal.  Neck: Normal range of motion.  Cardiovascular: Normal  rate and regular rhythm.   Pulmonary/Chest: Effort normal and breath sounds normal.  Some acne scars on the upper chest.   Abdominal: Soft. Bowel sounds are normal. She exhibits no distension. There is no tenderness. There is no rebound.  Musculoskeletal: She exhibits no edema.  Neurological: She is alert and oriented to person, place, and time. Coordination normal.  Skin: Skin is warm and dry.  Psychiatric: She has a normal mood and affect.   Filed Vitals:   10/27/15 1548  BP: 122/70  Pulse: 83  Temp: 98.7 F (37.1 C)  TempSrc: Oral  Resp: 12  Height: 5\' 4"  (1.626 m)  Weight: 184 lb (83.462 kg)  SpO2: 99%      Assessment & Plan:

## 2015-10-28 NOTE — Assessment & Plan Note (Signed)
Has had symptoms for some time. Tried prilosec with no relief. Wants referral to GI as she saw on tv someone with cancer of the stomach. No relief from PPI (although could have been suboptimal dosing). GI referral reasonable and placed today.

## 2015-11-05 ENCOUNTER — Telehealth: Payer: Self-pay | Admitting: Internal Medicine

## 2015-11-05 NOTE — Telephone Encounter (Signed)
Patient returned your call regarding her lab results. CB# [phone]

## 2015-11-05 NOTE — Telephone Encounter (Signed)
Called and spoke with patient regarding her lab results.

## 2015-12-01 ENCOUNTER — Encounter: Payer: Self-pay | Admitting: Internal Medicine

## 2016-01-09 ENCOUNTER — Encounter: Payer: Self-pay | Admitting: Physician Assistant

## 2016-01-13 ENCOUNTER — Other Ambulatory Visit: Payer: Self-pay | Admitting: Gastroenterology

## 2016-01-13 DIAGNOSIS — R1013 Epigastric pain: Secondary | ICD-10-CM

## 2016-01-16 ENCOUNTER — Ambulatory Visit (HOSPITAL_COMMUNITY)
Admission: RE | Admit: 2016-01-16 | Discharge: 2016-01-16 | Disposition: A | Discharge status: Home / Self Care | Payer: BC Managed Care – PPO | Source: Ambulatory Visit | Attending: Gastroenterology | Admitting: Gastroenterology

## 2016-01-16 DIAGNOSIS — K824 Cholesterolosis of gallbladder: Secondary | ICD-10-CM | POA: Diagnosis not present

## 2016-01-16 DIAGNOSIS — R935 Abnormal findings on diagnostic imaging of other abdominal regions, including retroperitoneum: Secondary | ICD-10-CM | POA: Diagnosis not present

## 2016-01-16 DIAGNOSIS — R1013 Epigastric pain: Secondary | ICD-10-CM | POA: Insufficient documentation

## 2016-01-20 ENCOUNTER — Other Ambulatory Visit: Payer: Self-pay | Admitting: Obstetrics and Gynecology

## 2016-01-20 ENCOUNTER — Ambulatory Visit (HOSPITAL_COMMUNITY)
Admission: RE | Admit: 2016-01-20 | Discharge: 2016-01-20 | Disposition: A | Discharge status: Home / Self Care | Payer: BC Managed Care – PPO | Source: Ambulatory Visit | Attending: Gastroenterology | Admitting: Gastroenterology

## 2016-01-20 DIAGNOSIS — K824 Cholesterolosis of gallbladder: Secondary | ICD-10-CM | POA: Diagnosis not present

## 2016-01-20 DIAGNOSIS — R110 Nausea: Secondary | ICD-10-CM | POA: Insufficient documentation

## 2016-01-20 DIAGNOSIS — R1011 Right upper quadrant pain: Secondary | ICD-10-CM | POA: Diagnosis present

## 2016-01-20 DIAGNOSIS — R1013 Epigastric pain: Secondary | ICD-10-CM

## 2016-01-20 MED ORDER — TECHNETIUM TC 99M MEBROFENIN IV KIT
5.2000 | PACK | Freq: Once | INTRAVENOUS | Status: AC | PRN
  Administered 2016-01-20: 5 via INTRAVENOUS

## 2016-01-23 ENCOUNTER — Other Ambulatory Visit: Payer: Self-pay | Admitting: Obstetrics and Gynecology

## 2016-02-03 ENCOUNTER — Other Ambulatory Visit (HOSPITAL_COMMUNITY): Payer: Self-pay | Admitting: Obstetrics and Gynecology

## 2016-02-03 NOTE — H&P (Signed)
Ana Freeman is a 45 y.o.  female G0  who  presents for myomectomy because of symptomatic uterine fibroids.  For over 10 years the patient has experienced progressively worsening menstrual periods and cramping.  Her periods, in recent years have lasted 9 days with a pad change every 2 hours.  She rated her cramping at 9.5/10 on a 10 point pain scale and achieved no relief with over the counter analgesia.  She denied any intermenstrual bleeding or changes in bowel or bladder function.  A TSH performed at the beginning of her evaluation was normal.   A  sono-hysterogram in March 2016 showed a  uterus: 7.85 x 13.7 x 6.19 cm  with diffuse fibroid involvement;  a large sub-serosal fibroid extending 7.5 cm above umbilicus: 123456 x 9.1 x 0000000 cm, sub-serosal: left anterior: 9.3 x 4.9 x 7.4 cm, left lateral: 7.8 x 5.5 x 7.6 cm and multiple intramural fibroids;  saline infusion revealed: solid sub-mucosal mass: superior- 1.7 x 1.5 x 2.1 cm, inferior-2.0 x 1.4 cm and left inferior-1.2 x 1.4 cm.  The patient was placed on Depo Provera in October 2016 but has bled continuously since then.  She has only,  however needed to change her pad twice a day on her heaviest days and one panty liner at other times.   Her hemoglobin in Janaury 2017 was 10.0 and an endometrial biopsy/cervical polypectomy  at that same time showed suprficial fragments of benign endometrial tissue with no dysplasia, atypia or malignancy.    A review of both medical and surgical management options for fibroid and accompanying symptoms were given to the patient however,  she has decided to proceed with surgical management of her fiboids.   Past Medical History  OB History: G: 0  GYN History: menarche: 45 YO    LMP: see HPI   Contracepton Depo-Provera  The patient denies history of sexually transmitted disease.;  Denies history of abnormal PAP smear.    Last PAP smear: 2017-normal   Medical History: Gallbladder Polyp,   Peptic Ulcer Disease and  Anemia  Surgical History: None Denies  history of blood transfusions  Family History: Hypertension, Diabetes Mellitus, Anemia and Ovarian Cancer  Social History: Single and employed as an Barista;  Denies tobacco use and occasionally uses alcohol   Outpatient Encounter Prescriptions as of 02/03/2016  Medication Sig  . esomeprazole (NEXIUM) 40 MG capsule Take 40 mg by mouth daily at 12 noon.  . iron polysaccharides (NIFEREX) 150 MG capsule Take 150 mg by mouth 2 (two) times daily.  . Multiple Vitamin (MULTIVITAMIN WITH MINERALS) TABS tablet Take 2 tablets by mouth daily.  . Sulfacetamide Sodium 9.8 % LOTN Use on acne daily (Patient not taking: Reported on 01/29/2016)   No facility-administered encounter medications on file as of 02/03/2016.    No Known Allergies    Denies sensitivity to peanuts, shellfish, soy, latex or adhesives.   ROS: Admits to glasses (but doesn't wear them),  right tinnitus,  occasional blurred vision and constipation;  Denies headache, vision changes, nasal congestion, dysphagia, dizziness, hoarseness, cough,  chest pain, shortness of breath, nausea, vomiting, diarrhea,  urinary frequency, urgency  dysuria, hematuria, vaginitis symptoms, swelling of joints,easy bruising,  myalgias, arthralgias, skin rashes, unexplained weight loss and except as is mentioned in the history of present illness, patient's review of systems is otherwise negative.   Physical Exam  Bp: 126/78     P: 82   R: 20    Temperature: 99.3 degrees  F orally     Weight: 182 lbs.  Height: 5\' 4"   BMI: 31.2  Neck: supple without masses or thyromegaly Lungs: clear to auscultation Heart: regular rate and rhythm Abdomen: firm mass from pelvis extending to 4 finger-breadths above umbilicus,  no tenderness Pelvic:EGBUS- wnl; vagina-normal rugae; uterus-firm, fills the pelvis and 22-24 weeks size; cervix-3 mm polyp (removed)  adnexae-no tenderness or masses Extremities:  no clubbing, cyanosis or  edema   Assesment: Large Symptomatic Uterine Fibroids           Menorrhagia           Dysmenorrhea          Anemia   Disposition:  Reviewed the risks of surgery to include, but not limited to: reaction to anesthesia, damage to adjacent organs, infection , excessive bleeding and if endometrial cavity is breached, a C-section delivery would be necessary.  i also reviewed the risk of needing to leave some fibroids if they were to small to identify with the naked eye, or if their removal would compromise my ability to avoid hysterectomy, which the patient adamently declines.  In spite of my best efforts, I did review with the patient and her mother that rarely hysterectomy at the time of intended myomectomy cannot be avoided.  Likewise, though most fibroids are benign growths, the removed tissue will be sent to pathology for final determination of benign tissue. I also reviewed that with fibroids this size, blood transfusion is needed, and the patient confirmed that she will accept blood transfusion if recommended.    The patient verbalized understanding of these risks and has consented to proceed with an Abdominal  Myomectomy at [facility] on February 12, 2016 at 7:30 a.m.    CSN# [id number]   Rosia Syme J. Florene Glen, PA-C  for Dr. Seymour Bars. Haygood

## 2016-02-04 ENCOUNTER — Ambulatory Visit: Payer: BC Managed Care – PPO | Admitting: Physician Assistant

## 2016-02-05 ENCOUNTER — Other Ambulatory Visit: Payer: Self-pay | Admitting: Surgery

## 2016-02-05 NOTE — Patient Instructions (Signed)
Your procedure is scheduled on:  Thursday, February 12, 2016  Enter through the Main Entrance of [facility] at:  6:00 AM  Pick up the phone at the desk and dial [phone].  Call this number if you have problems the morning of surgery: [phone].  Remember:  Do NOT eat food or drink after:  Midnight Wednesday  Take these medicines the morning of surgery with a SIP OF WATER:  None  Do NOT wear jewelry (body piercing), metal hair clips/bobby pins, make-up, or nail polish. Do NOT wear lotions, powders, or perfumes.  You may wear deoderant. Do NOT shave for 48 hours prior to surgery. Do NOT bring valuables to the hospital. Contacts, dentures, or bridgework may not be worn into surgery.  Leave suitcase in car.  After surgery it may be brought to your room.  For patients admitted to the hospital, checkout time is 11:00 AM the day of discharge.

## 2016-02-06 ENCOUNTER — Encounter (HOSPITAL_COMMUNITY): Payer: Self-pay

## 2016-02-06 ENCOUNTER — Encounter (HOSPITAL_COMMUNITY)
Admission: RE | Admit: 2016-02-06 | Discharge: 2016-02-06 | Disposition: A | Discharge status: Home / Self Care | Payer: BC Managed Care – PPO | Source: Ambulatory Visit | Attending: Obstetrics and Gynecology | Admitting: Obstetrics and Gynecology

## 2016-02-06 ENCOUNTER — Encounter: Payer: Self-pay | Admitting: Internal Medicine

## 2016-02-06 DIAGNOSIS — Z01812 Encounter for preprocedural laboratory examination: Secondary | ICD-10-CM | POA: Diagnosis present

## 2016-02-06 HISTORY — DX: Gastro-esophageal reflux disease without esophagitis: K21.9

## 2016-02-06 HISTORY — DX: Anesthesia of skin: R20.0

## 2016-02-06 LAB — CBC
HCT: 31.9 % — ABNORMAL LOW (ref 36.0–46.0)
Hemoglobin: 10.1 g/dL — ABNORMAL LOW (ref 12.0–15.0)
MCH: 24.1 pg — ABNORMAL LOW (ref 26.0–34.0)
MCHC: 31.7 g/dL (ref 30.0–36.0)
MCV: 76.1 fL — ABNORMAL LOW (ref 78.0–100.0)
Platelets: 367 10*3/uL (ref 150–400)
RBC: 4.19 MIL/uL (ref 3.87–5.11)
RDW: 16.9 % — ABNORMAL HIGH (ref 11.5–15.5)
WBC: 5.2 10*3/uL (ref 4.0–10.5)

## 2016-02-06 LAB — TYPE AND SCREEN
ABO/RH(D): O POS
Antibody Screen: NEGATIVE

## 2016-02-06 LAB — ABO/RH: ABO/RH(D): O POS

## 2016-02-11 MED ORDER — DEXTROSE 5 % IV SOLN
2.0000 g | INTRAVENOUS | Status: AC
  Administered 2016-02-12: 2 g via INTRAVENOUS
  Filled 2016-02-11: qty 2

## 2016-02-12 ENCOUNTER — Inpatient Hospital Stay (HOSPITAL_COMMUNITY): Payer: BC Managed Care – PPO | Admitting: Anesthesiology

## 2016-02-12 ENCOUNTER — Inpatient Hospital Stay (HOSPITAL_COMMUNITY)
Admission: RE | Admit: 2016-02-12 | Discharge: 2016-02-13 | DRG: 743 | Disposition: A | Discharge status: Home / Self Care | Payer: BC Managed Care – PPO | Source: Ambulatory Visit | Attending: Obstetrics and Gynecology | Admitting: Obstetrics and Gynecology

## 2016-02-12 ENCOUNTER — Encounter (HOSPITAL_COMMUNITY): Payer: Self-pay | Admitting: Anesthesiology

## 2016-02-12 ENCOUNTER — Encounter (HOSPITAL_COMMUNITY)
Admission: RE | Disposition: A | Discharge status: Home / Self Care | Payer: Self-pay | Source: Ambulatory Visit | Attending: Obstetrics and Gynecology

## 2016-02-12 DIAGNOSIS — D259 Leiomyoma of uterus, unspecified: Secondary | ICD-10-CM | POA: Diagnosis present

## 2016-02-12 DIAGNOSIS — D252 Subserosal leiomyoma of uterus: Secondary | ICD-10-CM | POA: Diagnosis present

## 2016-02-12 DIAGNOSIS — N841 Polyp of cervix uteri: Secondary | ICD-10-CM | POA: Diagnosis present

## 2016-02-12 DIAGNOSIS — K219 Gastro-esophageal reflux disease without esophagitis: Secondary | ICD-10-CM | POA: Diagnosis present

## 2016-02-12 DIAGNOSIS — D250 Submucous leiomyoma of uterus: Principal | ICD-10-CM | POA: Diagnosis present

## 2016-02-12 DIAGNOSIS — N946 Dysmenorrhea, unspecified: Secondary | ICD-10-CM | POA: Diagnosis present

## 2016-02-12 DIAGNOSIS — N920 Excessive and frequent menstruation with regular cycle: Secondary | ICD-10-CM | POA: Diagnosis present

## 2016-02-12 DIAGNOSIS — D649 Anemia, unspecified: Secondary | ICD-10-CM | POA: Diagnosis present

## 2016-02-12 HISTORY — PX: HYSTEROSCOPY WITH RESECTOSCOPE: SHX5395

## 2016-02-12 HISTORY — PX: DILATION AND EVACUATION: SHX1459

## 2016-02-12 HISTORY — PX: MYOMECTOMY: SHX85

## 2016-02-12 LAB — PREGNANCY, URINE: Preg Test, Ur: NEGATIVE

## 2016-02-12 SURGERY — MYOMECTOMY, ABDOMINAL APPROACH
Anesthesia: General | Site: Vagina

## 2016-02-12 MED ORDER — DOCUSATE SODIUM 100 MG PO CAPS
100.0000 mg | ORAL_CAPSULE | Freq: Two times a day (BID) | ORAL | Status: DC
  Administered 2016-02-12 – 2016-02-13 (×2): 100 mg via ORAL
  Filled 2016-02-12 (×2): qty 1

## 2016-02-12 MED ORDER — OXYCODONE HCL 5 MG/5ML PO SOLN: 5.0000 mg | Freq: Once | ORAL | Status: DC | PRN

## 2016-02-12 MED ORDER — ONDANSETRON HCL 4 MG/2ML IJ SOLN
INTRAMUSCULAR | Status: DC | PRN
  Administered 2016-02-12: 4 mg via INTRAVENOUS

## 2016-02-12 MED ORDER — HYDROMORPHONE HCL 1 MG/ML IJ SOLN
INTRAMUSCULAR | Status: AC
  Filled 2016-02-12: qty 1

## 2016-02-12 MED ORDER — KETOROLAC TROMETHAMINE 30 MG/ML IJ SOLN
30.0000 mg | Freq: Four times a day (QID) | INTRAMUSCULAR | Status: AC
  Administered 2016-02-12 – 2016-02-13 (×2): 30 mg via INTRAVENOUS
  Filled 2016-02-12 (×3): qty 1

## 2016-02-12 MED ORDER — VASOPRESSIN 20 UNIT/ML IV SOLN
INTRAVENOUS | Status: DC | PRN
  Administered 2016-02-12: 5 mL via INTRAMUSCULAR

## 2016-02-12 MED ORDER — FENTANYL CITRATE (PF) 250 MCG/5ML IJ SOLN
INTRAMUSCULAR | Status: AC
  Filled 2016-02-12: qty 5

## 2016-02-12 MED ORDER — BUPIVACAINE HCL (PF) 0.25 % IJ SOLN
INTRAMUSCULAR | Status: DC | PRN
  Administered 2016-02-12: 20 mL

## 2016-02-12 MED ORDER — LACTATED RINGERS IV SOLN
INTRAVENOUS | Status: DC
  Administered 2016-02-12 (×4): via INTRAVENOUS

## 2016-02-12 MED ORDER — BUPIVACAINE HCL (PF) 0.25 % IJ SOLN
INTRAMUSCULAR | Status: AC
  Filled 2016-02-12: qty 30

## 2016-02-12 MED ORDER — VASOPRESSIN 20 UNIT/ML IV SOLN
INTRAVENOUS | Status: AC
  Filled 2016-02-12: qty 2

## 2016-02-12 MED ORDER — PROPOFOL 10 MG/ML IV BOLUS
INTRAVENOUS | Status: AC
  Filled 2016-02-12: qty 20

## 2016-02-12 MED ORDER — KETOROLAC TROMETHAMINE 30 MG/ML IJ SOLN
INTRAMUSCULAR | Status: AC
  Filled 2016-02-12: qty 1

## 2016-02-12 MED ORDER — DEXAMETHASONE SODIUM PHOSPHATE 10 MG/ML IJ SOLN
INTRAMUSCULAR | Status: DC | PRN
  Administered 2016-02-12: 10 mg via INTRAVENOUS

## 2016-02-12 MED ORDER — GLYCOPYRROLATE 0.2 MG/ML IJ SOLN
INTRAMUSCULAR | Status: DC | PRN
  Administered 2016-02-12: .6 mg via INTRAVENOUS

## 2016-02-12 MED ORDER — GLYCOPYRROLATE 0.2 MG/ML IJ SOLN
INTRAMUSCULAR | Status: AC
  Filled 2016-02-12: qty 3

## 2016-02-12 MED ORDER — DEXAMETHASONE SODIUM PHOSPHATE 10 MG/ML IJ SOLN
INTRAMUSCULAR | Status: AC
  Filled 2016-02-12: qty 1

## 2016-02-12 MED ORDER — DIPHENHYDRAMINE HCL 12.5 MG/5ML PO ELIX: 12.5000 mg | ORAL_SOLUTION | Freq: Four times a day (QID) | ORAL | Status: DC | PRN

## 2016-02-12 MED ORDER — FENTANYL CITRATE (PF) 100 MCG/2ML IJ SOLN
INTRAMUSCULAR | Status: AC
  Filled 2016-02-12: qty 2

## 2016-02-12 MED ORDER — NALOXONE HCL 0.4 MG/ML IJ SOLN: 0.4000 mg | INTRAMUSCULAR | Status: DC | PRN

## 2016-02-12 MED ORDER — SILVER NITRATE-POT NITRATE 75-25 % EX MISC
CUTANEOUS | Status: DC | PRN
  Administered 2016-02-12: 1 via TOPICAL

## 2016-02-12 MED ORDER — DIPHENHYDRAMINE HCL 50 MG/ML IJ SOLN: 12.5000 mg | Freq: Four times a day (QID) | INTRAMUSCULAR | Status: DC | PRN

## 2016-02-12 MED ORDER — PANTOPRAZOLE SODIUM 40 MG PO TBEC
80.0000 mg | DELAYED_RELEASE_TABLET | Freq: Every day | ORAL | Status: DC
  Administered 2016-02-13: 80 mg via ORAL
  Filled 2016-02-12: qty 2

## 2016-02-12 MED ORDER — PANTOPRAZOLE SODIUM 40 MG PO TBEC
DELAYED_RELEASE_TABLET | ORAL | Status: AC
  Filled 2016-02-12: qty 1

## 2016-02-12 MED ORDER — PANTOPRAZOLE SODIUM 40 MG PO TBEC
40.0000 mg | DELAYED_RELEASE_TABLET | Freq: Once | ORAL | Status: AC
  Administered 2016-02-12: 40 mg via ORAL

## 2016-02-12 MED ORDER — MENTHOL 3 MG MT LOZG
1.0000 | LOZENGE | OROMUCOSAL | Status: DC | PRN
  Administered 2016-02-12: 3 mg via ORAL
  Filled 2016-02-12: qty 9

## 2016-02-12 MED ORDER — ACETAMINOPHEN 160 MG/5ML PO SOLN
325.0000 mg | ORAL | Status: DC | PRN
Start: 2016-02-12 — End: 2016-02-12

## 2016-02-12 MED ORDER — PROPOFOL 10 MG/ML IV BOLUS
INTRAVENOUS | Status: DC | PRN
  Administered 2016-02-12: 200 mg via INTRAVENOUS

## 2016-02-12 MED ORDER — ACETAMINOPHEN 325 MG PO TABS: 325.0000 mg | ORAL_TABLET | ORAL | Status: DC | PRN

## 2016-02-12 MED ORDER — NEOSTIGMINE METHYLSULFATE 10 MG/10ML IV SOLN
INTRAVENOUS | Status: AC
  Filled 2016-02-12: qty 1

## 2016-02-12 MED ORDER — MIDAZOLAM HCL 5 MG/5ML IJ SOLN
INTRAMUSCULAR | Status: DC | PRN
  Administered 2016-02-12: 2 mg via INTRAVENOUS

## 2016-02-12 MED ORDER — OXYCODONE HCL 5 MG PO TABS: 5.0000 mg | ORAL_TABLET | Freq: Once | ORAL | Status: DC | PRN

## 2016-02-12 MED ORDER — SCOPOLAMINE 1 MG/3DAYS TD PT72
MEDICATED_PATCH | TRANSDERMAL | Status: AC
  Administered 2016-02-12: 1.5 mg via TRANSDERMAL
  Filled 2016-02-12: qty 1

## 2016-02-12 MED ORDER — LIDOCAINE HCL (CARDIAC) 20 MG/ML IV SOLN
INTRAVENOUS | Status: DC | PRN
  Administered 2016-02-12: 100 mg via INTRAVENOUS

## 2016-02-12 MED ORDER — ONDANSETRON HCL 4 MG/2ML IJ SOLN
INTRAMUSCULAR | Status: AC
  Filled 2016-02-12: qty 2

## 2016-02-12 MED ORDER — ONDANSETRON HCL 4 MG/2ML IJ SOLN: 4.0000 mg | Freq: Four times a day (QID) | INTRAMUSCULAR | Status: DC | PRN

## 2016-02-12 MED ORDER — ROCURONIUM BROMIDE 100 MG/10ML IV SOLN
INTRAVENOUS | Status: DC | PRN
  Administered 2016-02-12: 60 mg via INTRAVENOUS
  Administered 2016-02-12 (×2): 20 mg via INTRAVENOUS

## 2016-02-12 MED ORDER — HEPARIN SODIUM (PORCINE) 5000 UNIT/ML IJ SOLN
INTRAMUSCULAR | Status: AC
  Filled 2016-02-12: qty 1

## 2016-02-12 MED ORDER — SODIUM CHLORIDE 0.9% FLUSH: 9.0000 mL | INTRAVENOUS | Status: DC | PRN

## 2016-02-12 MED ORDER — SCOPOLAMINE 1 MG/3DAYS TD PT72
1.0000 | MEDICATED_PATCH | Freq: Once | TRANSDERMAL | Status: DC
  Administered 2016-02-12: 1.5 mg via TRANSDERMAL

## 2016-02-12 MED ORDER — POLYSACCHARIDE IRON COMPLEX 150 MG PO CAPS
150.0000 mg | ORAL_CAPSULE | Freq: Two times a day (BID) | ORAL | Status: DC
  Administered 2016-02-12 – 2016-02-13 (×2): 150 mg via ORAL
  Filled 2016-02-12 (×4): qty 1

## 2016-02-12 MED ORDER — NEOSTIGMINE METHYLSULFATE 10 MG/10ML IV SOLN
INTRAVENOUS | Status: DC | PRN
  Administered 2016-02-12: 4 mg via INTRAVENOUS

## 2016-02-12 MED ORDER — LIDOCAINE HCL (CARDIAC) 20 MG/ML IV SOLN
INTRAVENOUS | Status: AC
  Filled 2016-02-12: qty 5

## 2016-02-12 MED ORDER — HYDROMORPHONE HCL 1 MG/ML IJ SOLN
0.2500 mg | INTRAMUSCULAR | Status: DC | PRN
  Administered 2016-02-12 (×3): 0.5 mg via INTRAVENOUS

## 2016-02-12 MED ORDER — 0.9 % SODIUM CHLORIDE (POUR BTL) OPTIME
TOPICAL | Status: DC | PRN
  Administered 2016-02-12: 3000 mL

## 2016-02-12 MED ORDER — ROCURONIUM BROMIDE 100 MG/10ML IV SOLN
INTRAVENOUS | Status: AC
  Filled 2016-02-12: qty 1

## 2016-02-12 MED ORDER — OXYCODONE-ACETAMINOPHEN 5-325 MG PO TABS
1.0000 | ORAL_TABLET | ORAL | Status: DC | PRN
  Administered 2016-02-13 (×2): 1 via ORAL
  Filled 2016-02-12 (×2): qty 1

## 2016-02-12 MED ORDER — LACTATED RINGERS IV SOLN: INTRAVENOUS | Status: DC

## 2016-02-12 MED ORDER — HYDROMORPHONE HCL 1 MG/ML IJ SOLN
INTRAMUSCULAR | Status: DC | PRN
  Administered 2016-02-12 (×3): 1 mg via INTRAVENOUS

## 2016-02-12 MED ORDER — KETOROLAC TROMETHAMINE 30 MG/ML IJ SOLN
INTRAMUSCULAR | Status: DC | PRN
  Administered 2016-02-12: 30 mg via INTRAVENOUS

## 2016-02-12 MED ORDER — HYDROMORPHONE 1 MG/ML IV SOLN
INTRAVENOUS | Status: DC
  Administered 2016-02-12: 2.1 mg via INTRAVENOUS
  Administered 2016-02-13 (×2): 0.6 mg via INTRAVENOUS
  Filled 2016-02-12: qty 25

## 2016-02-12 MED ORDER — HYDROMORPHONE HCL 1 MG/ML IJ SOLN
INTRAMUSCULAR | Status: AC
  Administered 2016-02-12: 0.5 mg via INTRAVENOUS
  Filled 2016-02-12: qty 1

## 2016-02-12 MED ORDER — SODIUM CHLORIDE 0.9 % IJ SOLN
INTRAMUSCULAR | Status: AC
  Filled 2016-02-12: qty 100

## 2016-02-12 MED ORDER — MIDAZOLAM HCL 2 MG/2ML IJ SOLN
INTRAMUSCULAR | Status: AC
  Filled 2016-02-12: qty 2

## 2016-02-12 MED ORDER — 0.9 % SODIUM CHLORIDE (POUR BTL) OPTIME
TOPICAL | Status: DC | PRN
  Administered 2016-02-12: 2000 mL

## 2016-02-12 MED ORDER — IBUPROFEN 600 MG PO TABS
600.0000 mg | ORAL_TABLET | Freq: Four times a day (QID) | ORAL | Status: DC | PRN
  Filled 2016-02-12: qty 1

## 2016-02-12 MED ORDER — ONDANSETRON HCL 4 MG PO TABS: 4.0000 mg | ORAL_TABLET | Freq: Three times a day (TID) | ORAL | Status: DC | PRN

## 2016-02-12 MED ORDER — FENTANYL CITRATE (PF) 100 MCG/2ML IJ SOLN
INTRAMUSCULAR | Status: DC | PRN
  Administered 2016-02-12: 150 ug via INTRAVENOUS
  Administered 2016-02-12: 50 ug via INTRAVENOUS
  Administered 2016-02-12 (×2): 100 ug via INTRAVENOUS
  Administered 2016-02-12: 150 ug via INTRAVENOUS
  Administered 2016-02-12: 50 ug via INTRAVENOUS

## 2016-02-12 MED ORDER — HEPARIN SODIUM (PORCINE) 5000 UNIT/ML IJ SOLN
INTRAMUSCULAR | Status: DC | PRN
  Administered 2016-02-12: 5000 [IU]

## 2016-02-12 SURGICAL SUPPLY — 67 items
BARRIER ADHS 3X4 INTERCEED (GAUZE/BANDAGES/DRESSINGS) ×2 IMPLANT
BIPOLAR CUTTING LOOP 21FR (ELECTRODE) ×2
BLADE EXTENDED COATED 6.5IN (ELECTRODE) IMPLANT
BRR ADH 4X3 ABS CNTRL BYND (GAUZE/BANDAGES/DRESSINGS) ×2
CANISTER SUCT 3000ML (MISCELLANEOUS) ×4 IMPLANT
CATH PEDI SILICON 3CC 8FR (CATHETERS) ×2 IMPLANT
CHLORAPREP W/TINT 26ML (MISCELLANEOUS) ×2 IMPLANT
CLOSURE WOUND 1/2 X4 (GAUZE/BANDAGES/DRESSINGS) ×1
CLOTH BEACON ORANGE TIMEOUT ST (SAFETY) ×4 IMPLANT
CONT PATH 16OZ SNAP LID 3702 (MISCELLANEOUS) ×4 IMPLANT
DECANTER SPIKE VIAL GLASS SM (MISCELLANEOUS) ×8 IMPLANT
DRAPE CESAREAN BIRTH W POUCH (DRAPES) ×4 IMPLANT
DRAPE HYSTEROSCOPY (DRAPE) ×2 IMPLANT
DRAPE SHEET LG 3/4 BI-LAMINATE (DRAPES) ×2 IMPLANT
DRAPE SURG 17X11 SM STRL (DRAPES) ×4 IMPLANT
DRAPE WARM FLUID 44X44 (DRAPE) ×2 IMPLANT
DRSG OPSITE POSTOP 4X10 (GAUZE/BANDAGES/DRESSINGS) ×2 IMPLANT
DRSG TEGADERM 6X8 (GAUZE/BANDAGES/DRESSINGS) ×2 IMPLANT
ELECT CAUTERY BLADE 6.4 (BLADE) IMPLANT
ELECT NDL TIP 2.8 STRL (NEEDLE) IMPLANT
ELECT NEEDLE TIP 2.8 STRL (NEEDLE) IMPLANT
EXTRACTOR VACUUM KIWI (MISCELLANEOUS) ×2 IMPLANT
GAUZE SPONGE 4X4 16PLY XRAY LF (GAUZE/BANDAGES/DRESSINGS) ×8 IMPLANT
GLOVE BIOGEL PI IND STRL 7.0 (GLOVE) ×2 IMPLANT
GLOVE BIOGEL PI INDICATOR 7.0 (GLOVE) ×2
GLOVE SURG SS PI 6.5 STRL IVOR (GLOVE) ×8 IMPLANT
GOWN STRL REUS W/TWL LRG LVL3 (GOWN DISPOSABLE) ×16 IMPLANT
KIT BERKELEY 1ST TRIMESTER 3/8 (MISCELLANEOUS) ×2 IMPLANT
LEGGING LITHOTOMY PAIR STRL (DRAPES) ×2 IMPLANT
LIQUID BAND (GAUZE/BANDAGES/DRESSINGS) IMPLANT
LOOP CUTTING BIPOLAR 21FR (ELECTRODE) IMPLANT
NDL SPNL 22GX3.5 QUINCKE BK (NEEDLE) ×2 IMPLANT
NEEDLE HYPO 22GX1.5 SAFETY (NEEDLE) ×4 IMPLANT
NEEDLE SPNL 22GX3.5 QUINCKE BK (NEEDLE) ×4 IMPLANT
PACK ABDOMINAL GYN (CUSTOM PROCEDURE TRAY) ×4 IMPLANT
PAD ABD 7.5X8 STRL (GAUZE/BANDAGES/DRESSINGS) ×2 IMPLANT
PAD OB MATERNITY 4.3X12.25 (PERSONAL CARE ITEMS) ×4 IMPLANT
PENCIL SMOKE EVAC W/HOLSTER (ELECTROSURGICAL) ×4 IMPLANT
RINGERS IRRIG 1000ML POUR BTL (IV SOLUTION) ×4 IMPLANT
SET BERKELEY SUCTION TUBING (SUCTIONS) ×2 IMPLANT
SPONGE LAP 18X18 X RAY DECT (DISPOSABLE) ×4 IMPLANT
STAPLER VISISTAT 35W (STAPLE) IMPLANT
STRIP CLOSURE SKIN 1/2X4 (GAUZE/BANDAGES/DRESSINGS) ×1 IMPLANT
SUT MNCRL AB 3-0 PS2 27 (SUTURE) IMPLANT
SUT PDS AB 0 CT 36 (SUTURE) IMPLANT
SUT VIC AB 0 CT1 18XCR BRD8 (SUTURE) IMPLANT
SUT VIC AB 0 CT1 27 (SUTURE) ×12
SUT VIC AB 0 CT1 27XBRD ANBCTR (SUTURE) ×4 IMPLANT
SUT VIC AB 0 CT1 8-18 (SUTURE)
SUT VIC AB 2-0 CT1 27 (SUTURE) ×4
SUT VIC AB 2-0 CT1 TAPERPNT 27 (SUTURE) ×2 IMPLANT
SUT VIC AB 3-0 CT1 27 (SUTURE)
SUT VIC AB 3-0 CT1 TAPERPNT 27 (SUTURE) IMPLANT
SUT VIC AB 3-0 SH 27 (SUTURE) ×8
SUT VIC AB 3-0 SH 27X BRD (SUTURE) ×4 IMPLANT
SUT VICRYL 0 TIES 12 18 (SUTURE) ×2 IMPLANT
SUT VICRYL 2 0 18  UND BR (SUTURE)
SUT VICRYL 2 0 18 UND BR (SUTURE) IMPLANT
SYR BULB IRRIGATION 50ML (SYRINGE) ×2 IMPLANT
SYR CONTROL 10ML LL (SYRINGE) ×8 IMPLANT
TAPE CLOTH SURG 4X10 WHT LF (GAUZE/BANDAGES/DRESSINGS) ×2 IMPLANT
TOWEL OR 17X24 6PK STRL BLUE (TOWEL DISPOSABLE) ×8 IMPLANT
TRAY FOLEY CATH SILVER 14FR (SET/KITS/TRAYS/PACK) ×4 IMPLANT
TUBING AQUILEX INFLOW (TUBING) ×2 IMPLANT
TUBING AQUILEX OUTFLOW (TUBING) ×2 IMPLANT
VACURETTE 8 RIGID CVD (CANNULA) ×2 IMPLANT
WATER STERILE IRR 1000ML POUR (IV SOLUTION) ×2 IMPLANT

## 2016-02-12 NOTE — Transfer of Care (Signed)
Immediate Anesthesia Transfer of Care Note  Patient: Ana Freeman  Procedure(s) Performed: Procedure(s): ABDOMINAL MYOMECTOMY (N/A) DILATATION AND EVACUATION WITH SUCTION  (N/A) HYSTEROSCOPIC MYOMECTOMY WITH RESECTOSCOPE (N/A)  Patient Location: PACU  Anesthesia Type:General  Level of Consciousness: awake, alert  and oriented  Airway & Oxygen Therapy: Patient Spontanous Breathing and Patient connected to nasal cannula oxygen  Post-op Assessment: Report given to RN and Post -op Vital signs reviewed and stable  Post vital signs: Reviewed and stable  Last Vitals:  Filed Vitals:   02/12/16 0554  BP: 134/82  Pulse: 88  Temp: 37.2 C  Resp: 20    Complications: No apparent anesthesia complications

## 2016-02-12 NOTE — Anesthesia Procedure Notes (Signed)
Procedure Name: Intubation Date/Time: 02/12/2016 7:29 AM Performed by: Riki Sheer Pre-anesthesia Checklist: Patient identified, Emergency Drugs available, Suction available, Patient being monitored and Timeout performed Patient Re-evaluated:Patient Re-evaluated prior to inductionOxygen Delivery Method: Circle system utilized Preoxygenation: Pre-oxygenation with 100% oxygen Intubation Type: IV induction Ventilation: Mask ventilation without difficulty Laryngoscope Size: Miller and 2 Grade View: Grade I Tube type: Oral Tube size: 7.0 mm Number of attempts: 1 Airway Equipment and Method: Stylet Placement Confirmation: ETT inserted through vocal cords under direct vision,  positive ETCO2,  CO2 detector and breath sounds checked- equal and bilateral Secured at: 22 cm Tube secured with: Tape Dental Injury: Teeth and Oropharynx as per pre-operative assessment

## 2016-02-12 NOTE — Anesthesia Preprocedure Evaluation (Signed)
Anesthesia Evaluation  Patient identified by MRN, date of birth, ID band Patient awake    Reviewed: Allergy & Precautions, NPO status , Patient's Chart, lab work & pertinent test results  History of Anesthesia Complications Negative for: history of anesthetic complications  Airway Mallampati: II  TM Distance: >3 FB Neck ROM: Full    Dental  (+) Teeth Intact   Pulmonary neg pulmonary ROS,    breath sounds clear to auscultation       Cardiovascular negative cardio ROS   Rhythm:Regular     Neuro/Psych negative neurological ROS  negative psych ROS   GI/Hepatic Neg liver ROS, GERD  Medicated and Controlled,  Endo/Other  negative endocrine ROS  Renal/GU negative Renal ROS     Musculoskeletal   Abdominal   Peds  Hematology  (+) anemia ,   Anesthesia Other Findings   Reproductive/Obstetrics                             Anesthesia Physical Anesthesia Plan  ASA: II  Anesthesia Plan: General   Post-op Pain Management:    Induction: Intravenous  Airway Management Planned: Oral ETT  Additional Equipment: None  Intra-op Plan:   Post-operative Plan: Extubation in OR  Informed Consent: I have reviewed the patients History and Physical, chart, labs and discussed the procedure including the risks, benefits and alternatives for the proposed anesthesia with the patient or authorized representative who has indicated his/her understanding and acceptance.   Dental advisory given  Plan Discussed with: CRNA and Surgeon  Anesthesia Plan Comments:         Anesthesia Quick Evaluation

## 2016-02-12 NOTE — Op Note (Signed)
Marland KitchenProcedure(s): ABDOMINAL MYOMECTOMY DILATATION AND EVACUATION WITH SUCTION  HYSTEROSCOPIC MYOMECTOMY WITH RESECTOSCOPE Procedure Note  Ana Freeman female 45 y.o. 02/12/2016  Procedure(s) and Anesthesia Type:    * ABDOMINAL MYOMECTOMY - General    * DILATATION AND EVACUATION WITH SUCTION  - General    * HYSTEROSCOPIC MYOMECTOMY WITH RESECTOSCOPE - General  Surgeon(s) and Role:    * Eldred Manges, MD - Primary   Indications: The patient was admitted via the operating room for management of large uterine fibroids and menorrhagia.     Surgeon: Eldred Manges   Assistants: Earnstine Regal certified physician assistant  Anesthesia: General endotracheal anesthesia  ASA Class: 2    Procedure Detail  ABDOMINAL MYOMECTOMY, DILATATION AND EVACUATION WITH SUCTION , HYSTEROSCOPIC MYOMECTOMY WITH RESECTOSCOPE  Findings: The uterus was minimally enlarged with a 2 x 2 centimeter fundal subserosal fibroid. A pedunculated fibroid emanating from a stalk on the posterior uterus filled the abdominal and pelvic cavities. This fibroid measured 17 x 16.5 x 8 cm and was completely mobile being attached only by the 3 cm stalk to the posterior uterus. The tubes and ovaries were normal bilaterally without evidence of endometriosis or adhesions. The appendix appeared normal. At hysteroscopy, the patient had 2 subserosal fibroids, each measuring approximately 2 cm.   Estimated Blood Loss:  500 cc         Drains: Urethral Foley catheter     Intrauterine pediatric catheter 8 [place] with 5 cc saline         Total IV Fluids: 3100 cc ml  Blood Given: none          Specimens: 1) Uterine myomata, the largest of which measured 17 x 16.5 x 8 cm in dimension, and weight 1744 g.  Another fibroid was removed transabdominally and measured approximately 2 x 2 centimeters                   2) fragments of uterine fibroid removed at hysteroscopy            3) peritoneal fluid sent for cytology           Complications:  None         Disposition: PACU - hemodynamically stable.         Condition: stable  Procedure:  The patient was taken the operating room after appropriate identification and placed on the operating table in the supine position. After attainment of adequate general anesthesia, a timeout was performed. The abdomen was prepped with ChloraPrep. Perineum was prepped with Betadine and a Foley catheter inserted into the bladder under sterile conditions and connected to straight drainage.  The abdomen was draped as a sterile field. Seifert pubic infiltration of the subcutaneous tissue with 20 cc of quarter percent Marcaine was undertaken. A transverse incision was made approximately 3 cm above the symphysis pubis and the abdomen opened in layers. The peritoneum was entered and copious amounts of peritoneal fluid was noted. 170 cc of peritoneal fluid was withdrawn and sent for cytologic analysis. Examination of the pelvis and upper abdomen revealed the above-noted findings. Specifically, the markedly enlarged uterine fibroid that was connected to the posterior uterus by a single 3 cm stalk. The remainder of the upper abdominal examination revealed no significant findings. Once the uterine stalk of the pedunculated fibroid was felt it was exposed by elevating the large myoma with traction sutures. 2 sets of sutures were placed on the stalk. Both with free ties and with Haney stitches.  All sutures were 0 Vicryl. With careful protection of the underlying tissues, the soft stalk was bisected between the 2 sets of sutures, allowing hemostasis on both the fibroid and the uterine sides. The large pedunculated fibroid was then removed from the operative field through the transverse incision.  An additional myoma was then removed from the fundus by infiltrating the serosa with a dilute solution of Pitressin, incising the serosa to the level of the fibroid, then shelling the fibroid out. The myoma bed was  closed with figure-of-eight sutures of 0 Vicryl. The serosa was repaired with a running interlocking suture of 3-0 Vicryl. Additional hemostatic sutures were required for the area of the stalk of the pedunculated fibroid. These were all of 0 Vicryl in a figure-of-eight fashion. Once hemostasis was noted in both these areas, and copious irrigation had been carried out, they were covered with pieces of Interceed.  No other fibroids could be identified visually or by palpation. Approximately 50 cc of warm saline was left in the peritoneal cavity. The abdominal peritoneum was closed with a running suture of 0 Vicryl. The fascia was closed with running sutures of 0 Vicryl from each apex to the midline. The subcutaneous tissue was made hemostatic with Bovie cautery and irrigated. The skin incision was closed with subcuticular sutures of 3-0 Monocryl. Steri-Strips were applied to the hemostatic incision and a pressure dressing applied.  I remained concerned that the patient's menorrhagia may not have been explained by the fibroids that had been removed abdominally, and decided to try to ensure that as complete a resection as possible had been achieved by viewing the endometrial cavity hysteroscopically. The patient was then put in the lithotomy position and the perineum and vagina prepped with multiple layers of Betadine and draped as a sterile field. A gray speculum was placed in the vagina and a single-tooth tenaculum placed on the anterior cervix. The patient was bleeding with passage of large clots, so the decision was made to dilate the cervix to accommodate a #8 suction curette and to suction evacuate the clots from the endometrial and filled with saline, 5 cc to facilitate the hysteroscopic examination.  The cervix was then dilated to accommodate the diagnostic hysteroscope and the 2 above-noted myomata were noted that were almost completely intracavitary. The bipolar resectoscope was used to resect the 2 fibroids  and sharp curettage was performed to remove as much of the resected material as possible. The suction curet was used to remove an additional portion of the resected material all of which was sent for pathologic diagnosis. An 8 [place] pediatric Foley catheter was then placed into the endometrial cavity and filled with 5 cc of normal saline to provide tamponade not after the resection. All instruments were then removed from the vagina after a hemostatic suture had been placed on the cervix at the site of the tenaculum. Hemostasis was noted to be adequate and the patient was awakened from general anesthesia and taken to the recovery room in satisfactory condition having tolerated the procedure well with sponge and instrument counts correct. The patient continued with sequential compression devices throughout the entire procedure and continued their use postoperatively.

## 2016-02-12 NOTE — H&P (Signed)
History and Physical Interval Note:   02/12/2016   6:56 AM   Ana Freeman  has presented today for surgery, with the diagnosis of Uterine Fibroids, Cervical Polyp, Anemia  The various methods of treatment have been discussed with the patient and family. After consideration of risks, benefits and other options for treatment, the patient has consented to  Procedure(s): MYOMECTOMY as a surgical intervention .  I have reviewed the patients' chart and labs.  Questions were answered to the patient's satisfaction.  I have reviewed the possible need for transfusion and the patient has an active type and screen that is negative for antibodies confirmed with the blood bank this morning.  Will proceed after all preop preparation is complete.   Eldred Manges  MD

## 2016-02-12 NOTE — Progress Notes (Signed)
Day of Surgery Procedure(s) (LRB): ABDOMINAL MYOMECTOMY (N/A) DILATATION AND EVACUATION WITH SUCTION  (N/A) HYSTEROSCOPIC MYOMECTOMY WITH RESECTOSCOPE (N/A)  Subjective: Patient reports no nausea, vomiting and tolerating PO. Pain controlled with PCA .   Pt has ambulated in the hall and tolerated liquid diet.  Wants more to eat. Wants to consider discharge on 02/13/16  Objective: I have reviewed patient's vital signs, intake and output and medications.  General: alert, cooperative and no distress Resp: clear to auscultation bilaterally Cardio: regular rate and rhythm, S1, S2 normal, no murmur, click, rub or gallop GI: soft, non-tender; bowel sounds normal; no masses,  no organomegaly.  Dressing dry Extremities: extremities normal, atraumatic, no cyanosis or edema Vaginal Bleeding: minimal Single pad since PACU  Assessment: s/p Procedure(s): ABDOMINAL MYOMECTOMY (N/A) DILATATION AND EVACUATION WITH SUCTION  (N/A) HYSTEROSCOPIC MYOMECTOMY WITH RESECTOSCOPE (N/A): stable, progressing well and tolerating diet  Plan: Intrauterine catheter removed without difficulty Advance diet Encourage ambulation Advance to PO medication tonight if tolerates diet. Discharge home on 02/13/16 if all parameters met  LOS: 0 days    Joandry Slagter P 02/12/2016, 10:08 PM

## 2016-02-12 NOTE — Anesthesia Postprocedure Evaluation (Signed)
Anesthesia Post Note  Patient: Ana Freeman  Procedure(s) Performed: Procedure(s) (LRB): ABDOMINAL MYOMECTOMY (N/A) DILATATION AND EVACUATION WITH SUCTION  (N/A) HYSTEROSCOPIC MYOMECTOMY WITH RESECTOSCOPE (N/A)  Patient location during evaluation: [facility] Anesthesia Type: General Level of consciousness: awake, awake and alert and oriented Pain management: pain level controlled Vital Signs Assessment: post-procedure vital signs reviewed and stable Respiratory status: patient connected to nasal cannula oxygen and spontaneous breathing Cardiovascular status: stable Postop Assessment: no signs of nausea or vomiting and adequate PO intake Anesthetic complications: no    Last Vitals:  Filed Vitals:   02/12/16 1400 02/12/16 1434  BP:  115/73  Pulse:  74  Temp: 36.7 C 37.1 C  Resp: 16 16    Last Pain:  Filed Vitals:   02/12/16 1447  PainSc: 2                  Akhilesh Sassone Hristova

## 2016-02-13 ENCOUNTER — Encounter (HOSPITAL_COMMUNITY): Payer: Self-pay | Admitting: Obstetrics and Gynecology

## 2016-02-13 LAB — CBC
HCT: 25.4 % — ABNORMAL LOW (ref 36.0–46.0)
Hemoglobin: 7.8 g/dL — ABNORMAL LOW (ref 12.0–15.0)
MCH: 23.3 pg — ABNORMAL LOW (ref 26.0–34.0)
MCHC: 30.7 g/dL (ref 30.0–36.0)
MCV: 75.8 fL — ABNORMAL LOW (ref 78.0–100.0)
Platelets: 285 10*3/uL (ref 150–400)
RBC: 3.35 MIL/uL — ABNORMAL LOW (ref 3.87–5.11)
RDW: 16.2 % — ABNORMAL HIGH (ref 11.5–15.5)
WBC: 6.4 10*3/uL (ref 4.0–10.5)

## 2016-02-13 MED ORDER — ONDANSETRON HCL 4 MG PO TABS: 4.0000 mg | ORAL_TABLET | Freq: Three times a day (TID) | ORAL | Status: DC | PRN

## 2016-02-13 MED ORDER — IBUPROFEN 600 MG PO TABS: ORAL_TABLET | ORAL | Status: DC

## 2016-02-13 MED ORDER — OXYCODONE-ACETAMINOPHEN 5-325 MG PO TABS: 1.0000 | ORAL_TABLET | ORAL | Status: DC | PRN

## 2016-02-13 NOTE — Discharge Summary (Signed)
Physician Discharge Summary  Patient ID: Ana Freeman MRN: [id number] DOB/AGE: [birth year] 44 y.o.  Admit date: 02/12/2016 Discharge date: 02/13/2016   Discharge Diagnoses: Symptomatic Uterine Fibroids, Menorrhagia, Dysmenorrhea and Anemia Active Problems:   Menorrhagia   Fibroid, uterine   Operation: Abdominal Myomectomy, Hysteroscopic Resection of Submucosal Fibroids,  Dilatation and Suction Evacuation of the Endometrial Cavity  Discharged Condition: Good   Hospital Course: On the date of admission the patient underwent the aforementioned procedures and tolerated them well.  Post operative course was unremarkable with the patient resuming bowel and bladder function  and  tolerating  a hemoglobin of 7.8  by post operative day #1.  Having received the maximum benefit of her hospital stay,  the patient requested and was granted discharge home on post operative day #1.  Disposition: Home to Self Care   Discharge Medications:    Medication List    TAKE these medications        esomeprazole 40 MG capsule  Commonly known as:  NEXIUM  Take 40 mg by mouth daily at 12 noon.     ibuprofen 600 MG tablet  Commonly known as:  ADVIL,MOTRIN  1  po  pc every 6 hours for 5 days then as needed for pain     iron polysaccharides 150 MG capsule  Commonly known as:  NIFEREX  Take 150 mg by mouth 2 (two) times daily.     multivitamin with minerals Tabs tablet  Take 2 tablets by mouth daily.     ondansetron 4 MG tablet  Commonly known as:  ZOFRAN  Take 1 tablet (4 mg total) by mouth every 8 (eight) hours as needed for nausea or vomiting.     oxyCODONE-acetaminophen 5-325 MG tablet  Commonly known as:  PERCOCET/ROXICET  Take 1-2 tablets by mouth every 4 (four) hours as needed for severe pain (moderate to severe pain (when tolerating fluids)).     Sulfacetamide Sodium 9.8 % Lotn  Use on acne daily           Follow-up: Dr. Lorriane Shire P. [name] on March 24, 2016 at 9:30 a.m. Pt has  indicated that she may want to have post-op sooner and will call if that is the caswe   Signed: POWELL,ELMIRA, PA-C 02/13/2016, 8:13 AM

## 2016-02-13 NOTE — Discharge Instructions (Signed)
Call [facility] @ [phone] if:  You have a temperature greater than or equal to 100.4 degrees Farenheit orally You have pain that is not made better by the pain medication given and taken as directed You have excessive bleeding or problems urinating  Take Colace (Docusate Sodium/Stool Softener) 100 mg 2-3 times daily while taking narcotic pain medicine to avoid constipation or until bowel movements are regular. Take Ibuprofen 600 mg with food every 6 hours for 5 days then as needed for pain Continue your iron supplementation twice a day for the next 6 months  You may drive after 2  weeks You may walk up steps  You may shower  You may resume a regular diet  Keep incisions clean and dry;  remove your honeycomb dressing February 19, 2016 Do not lift over 15 pounds for 6 weeks Avoid anything in vagina for 6 weeks (or until after your post-operative visit)

## 2016-02-13 NOTE — Progress Notes (Signed)
Ana Freeman is a27 y.o.  [id number]  Post Op Date # 1: Abdominal and Hysteroscopic Myomectomy  Subjective: Patient is Doing well postoperatively. Patient has no report of pain currently.  Has ordered a regular diet (has been tolerating liquids). She has ambulated in the halls without dizziness,  has no complaints of nausea and has voided without difficulty.   Objective: Vital signs in last 24 hours: Temp:  [98.1 F (36.7 C)-98.8 F (37.1 C)] 98.5 F (36.9 C) (03/03 0540) Pulse Rate:  [74-96] 81 (03/03 0540) Resp:  [12-22] 18 (03/03 0540) BP: (103-130)/(61-88) 103/63 mmHg (03/03 0540) SpO2:  [100 %] 100 % (03/03 0540) Weight:  [180 lb (81.647 kg)] 180 lb (81.647 kg) (03/02 1739)  Intake/Output from previous day: 03/02 0701 - 03/03 0700 In: 3403.3 [I.V.:3403.3] Out: 2525 [Urine:2025] Intake/Output this shift:    Recent Labs Lab 02/06/16 1620 02/13/16 0513  WBC 5.2 6.4  HGB 10.1* 7.8*  HCT 31.9* 25.4*  PLT 367 285    EXAM: General: alert, cooperative and no distress Resp: clear to auscultation bilaterally Cardio: regular rate and rhythm, S1, S2 normal, no murmur, click, rub or gallop GI: Bowel sounds present;  dressing is clean/dry/intact Extremities: Homans sign is negative, no sign of DVT and no calf tenderness. Vaginal Bleeding: #2 faint dry pink smudges on perineal pad   Assessment: s/p Procedure(s): ABDOMINAL MYOMECTOMY DILATATION AND EVACUATION WITH SUCTION  HYSTEROSCOPIC MYOMECTOMY WITH RESECTOSCOPE: stable, progressing well and anemia which she is tolerating well.  Plan: Advance diet Will consider discharge home today (the patient has asked to do so if possible)  LOS: 1 day    POWELL,ELMIRA, PA-C 02/13/2016 8:02 AM

## 2016-04-23 ENCOUNTER — Other Ambulatory Visit: Payer: Self-pay

## 2016-04-23 MED ORDER — POLYSACCHARIDE IRON COMPLEX 150 MG PO CAPS: 150.0000 mg | ORAL_CAPSULE | Freq: Two times a day (BID) | ORAL | Status: DC

## 2016-09-27 ENCOUNTER — Other Ambulatory Visit: Payer: Self-pay | Admitting: Obstetrics and Gynecology

## 2016-09-27 DIAGNOSIS — Z1231 Encounter for screening mammogram for malignant neoplasm of breast: Secondary | ICD-10-CM

## 2016-10-15 ENCOUNTER — Ambulatory Visit
Admission: RE | Admit: 2016-10-15 | Discharge: 2016-10-15 | Disposition: A | Discharge status: Home / Self Care | Payer: BC Managed Care – PPO | Source: Ambulatory Visit | Attending: Obstetrics and Gynecology | Admitting: Obstetrics and Gynecology

## 2016-10-15 DIAGNOSIS — Z1231 Encounter for screening mammogram for malignant neoplasm of breast: Secondary | ICD-10-CM

## 2016-10-28 ENCOUNTER — Encounter: Payer: BC Managed Care – PPO | Admitting: Internal Medicine

## 2016-11-09 ENCOUNTER — Encounter: Payer: BC Managed Care – PPO | Admitting: Internal Medicine

## 2016-12-10 ENCOUNTER — Ambulatory Visit (INDEPENDENT_AMBULATORY_CARE_PROVIDER_SITE_OTHER): Payer: BC Managed Care – PPO | Admitting: Internal Medicine

## 2016-12-10 ENCOUNTER — Encounter: Payer: Self-pay | Admitting: Internal Medicine

## 2016-12-10 ENCOUNTER — Other Ambulatory Visit (INDEPENDENT_AMBULATORY_CARE_PROVIDER_SITE_OTHER): Payer: BC Managed Care – PPO

## 2016-12-10 VITALS — BP 112/70 | HR 78 | Temp 98.2°F | Resp 12 | Ht 64.0 in | Wt 186.0 lb

## 2016-12-10 DIAGNOSIS — D500 Iron deficiency anemia secondary to blood loss (chronic): Secondary | ICD-10-CM

## 2016-12-10 DIAGNOSIS — K219 Gastro-esophageal reflux disease without esophagitis: Secondary | ICD-10-CM

## 2016-12-10 DIAGNOSIS — Z0000 Encounter for general adult medical examination without abnormal findings: Secondary | ICD-10-CM

## 2016-12-10 LAB — CBC
HCT: 30.2 % — ABNORMAL LOW (ref 36.0–46.0)
Hemoglobin: 9.6 g/dL — ABNORMAL LOW (ref 12.0–15.0)
MCHC: 31.8 g/dL (ref 30.0–36.0)
MCV: 67.7 fl — ABNORMAL LOW (ref 78.0–100.0)
Platelets: 393 10*3/uL (ref 150.0–400.0)
RBC: 4.45 Mil/uL (ref 3.87–5.11)
RDW: 17.2 % — ABNORMAL HIGH (ref 11.5–15.5)
WBC: 3.6 10*3/uL — ABNORMAL LOW (ref 4.0–10.5)

## 2016-12-10 LAB — LIPID PANEL
Cholesterol: 202 mg/dL — ABNORMAL HIGH (ref 0–200)
HDL: 59.7 mg/dL (ref >39.00)
LDL Cholesterol: 127 mg/dL — ABNORMAL HIGH (ref 0–99)
NonHDL: 142.15
Total CHOL/HDL Ratio: 3
Triglycerides: 74 mg/dL (ref 0.0–149.0)
VLDL: 14.8 mg/dL (ref 0.0–40.0)

## 2016-12-10 LAB — COMPREHENSIVE METABOLIC PANEL
ALT: 11 U/L (ref 0–35)
AST: 11 U/L (ref 0–37)
Albumin: 4.1 g/dL (ref 3.5–5.2)
Alkaline Phosphatase: 92 U/L (ref 39–117)
BUN: 12 mg/dL (ref 6–23)
CO2: 26 mEq/L (ref 19–32)
Calcium: 11 mg/dL — ABNORMAL HIGH (ref 8.4–10.5)
Chloride: 108 mEq/L (ref 96–112)
Creatinine, Ser: 0.97 mg/dL (ref 0.40–1.20)
GFR: 80.21 mL/min (ref >60.00)
Glucose, Bld: 103 mg/dL — ABNORMAL HIGH (ref 70–99)
Potassium: 4.9 mEq/L (ref 3.5–5.1)
Sodium: 139 mEq/L (ref 135–145)
Total Bilirubin: 0.2 mg/dL (ref 0.2–1.2)
Total Protein: 7.3 g/dL (ref 6.0–8.3)

## 2016-12-10 LAB — TSH: TSH: 0.85 u[IU]/mL (ref 0.35–4.50)

## 2016-12-10 LAB — VITAMIN D 25 HYDROXY (VIT D DEFICIENCY, FRACTURES): VITD: 20.68 ng/mL — ABNORMAL LOW (ref 30.00–100.00)

## 2016-12-10 LAB — FERRITIN: Ferritin: 4.4 ng/mL — ABNORMAL LOW (ref 10.0–291.0)

## 2016-12-10 MED ORDER — NEOMYCIN-POLYMYXIN-HC 3.5-10000-1 OT SUSP: 3.0000 [drp] | Freq: Three times a day (TID) | OTIC | 0 refills | Status: DC

## 2016-12-10 NOTE — Assessment & Plan Note (Signed)
Checking labs, declines flu and tetanus today. Talked to her about the need for weight loss and increased exercise to help with her health. Counseled on dangers of distracted driving. Given screening recommendations.

## 2016-12-10 NOTE — Patient Instructions (Signed)
We have sent in the ear drops for you to use. 3 drops in the ear 3 times a day for 3 days.   We are checking the labs as well and will call you back with the results.   Work on getting back to some exercise to help the health.  Health Maintenance, Female Introduction Adopting a healthy lifestyle and getting preventive care can go a long way to promote health and wellness. Talk with your health care provider about what schedule of regular examinations is right for you. This is a good chance for you to check in with your provider about disease prevention and staying healthy. In between checkups, there are plenty of things you can do on your own. Experts have done a lot of research about which lifestyle changes and preventive measures are most likely to keep you healthy. Ask your health care provider for more information. Weight and diet Eat a healthy diet  Be sure to include plenty of vegetables, fruits, low-fat dairy products, and lean protein.  Do not eat a lot of foods high in solid fats, added sugars, or salt.  Get regular exercise. This is one of the most important things you can do for your health.  Most adults should exercise for at least 150 minutes each week. The exercise should increase your heart rate and make you sweat (moderate-intensity exercise).  Most adults should also do strengthening exercises at least twice a week. This is in addition to the moderate-intensity exercise. Maintain a healthy weight  Body mass index (BMI) is a measurement that can be used to identify possible weight problems. It estimates body fat based on height and weight. Your health care provider can help determine your BMI and help you achieve or maintain a healthy weight.  For females 42 years of age and older:  A BMI below 18.5 is considered underweight.  A BMI of 18.5 to 24.9 is normal.  A BMI of 25 to 29.9 is considered overweight.  A BMI of 30 and above is considered obese. Watch levels of  cholesterol and blood lipids  You should start having your blood tested for lipids and cholesterol at 44 years of age, then have this test every 5 years.  You may need to have your cholesterol levels checked more often if:  Your lipid or cholesterol levels are high.  You are older than 44 years of age.  You are at high risk for heart disease. Cancer screening Lung Cancer  Lung cancer screening is recommended for adults 65-10 years old who are at high risk for lung cancer because of a history of smoking.  A yearly low-dose CT scan of the lungs is recommended for people who:  Currently smoke.  Have quit within the past 15 years.  Have at least a 30-pack-year history of smoking. A pack year is smoking an average of one pack of cigarettes a day for 1 year.  Yearly screening should continue until it has been 15 years since you quit.  Yearly screening should stop if you develop a health problem that would prevent you from having lung cancer treatment. Breast Cancer  Practice breast self-awareness. This means understanding how your breasts normally appear and feel.  It also means doing regular breast self-exams. Let your health care provider know about any changes, no matter how small.  If you are in your 20s or 30s, you should have a clinical breast exam (CBE) by a health care provider every 1-3 years as part of  a regular health exam.  If you are 5 or older, have a CBE every year. Also consider having a breast X-ray (mammogram) every year.  If you have a family history of breast cancer, talk to your health care provider about genetic screening.  If you are at high risk for breast cancer, talk to your health care provider about having an MRI and a mammogram every year.  Breast cancer gene (BRCA) assessment is recommended for women who have family members with BRCA-related cancers. BRCA-related cancers include:  Breast.  Ovarian.  Tubal.  Peritoneal cancers.  Results of  the assessment will determine the need for genetic counseling and BRCA1 and BRCA2 testing. Cervical Cancer  Your health care provider may recommend that you be screened regularly for cancer of the pelvic organs (ovaries, uterus, and vagina). This screening involves a pelvic examination, including checking for microscopic changes to the surface of your cervix (Pap test). You may be encouraged to have this screening done every 3 years, beginning at age 70.  For women ages 67-65, health care providers may recommend pelvic exams and Pap testing every 3 years, or they may recommend the Pap and pelvic exam, combined with testing for human papilloma virus (HPV), every 5 years. Some types of HPV increase your risk of cervical cancer. Testing for HPV may also be done on women of any age with unclear Pap test results.  Other health care providers may not recommend any screening for nonpregnant women who are considered low risk for pelvic cancer and who do not have symptoms. Ask your health care provider if a screening pelvic exam is right for you.  If you have had past treatment for cervical cancer or a condition that could lead to cancer, you need Pap tests and screening for cancer for at least 20 years after your treatment. If Pap tests have been discontinued, your risk factors (such as having a new sexual partner) need to be reassessed to determine if screening should resume. Some women have medical problems that increase the chance of getting cervical cancer. In these cases, your health care provider may recommend more frequent screening and Pap tests. Colorectal Cancer  This type of cancer can be detected and often prevented.  Routine colorectal cancer screening usually begins at 44 years of age and continues through 44 years of age.  Your health care provider may recommend screening at an earlier age if you have risk factors for colon cancer.  Your health care provider may also recommend using home test  kits to check for hidden blood in the stool.  A small camera at the end of a tube can be used to examine your colon directly (sigmoidoscopy or colonoscopy). This is done to check for the earliest forms of colorectal cancer.  Routine screening usually begins at age 40.  Direct examination of the colon should be repeated every 5-10 years through 44 years of age. However, you may need to be screened more often if early forms of precancerous polyps or small growths are found. Skin Cancer  Check your skin from head to toe regularly.  Tell your health care provider about any new moles or changes in moles, especially if there is a change in a mole's shape or color.  Also tell your health care provider if you have a mole that is larger than the size of a pencil eraser.  Always use sunscreen. Apply sunscreen liberally and repeatedly throughout the day.  Protect yourself by wearing long sleeves, pants, a  wide-brimmed hat, and sunglasses whenever you are outside. Heart disease, diabetes, and high blood pressure  High blood pressure causes heart disease and increases the risk of stroke. High blood pressure is more likely to develop in:  People who have blood pressure in the high end of the normal range (130-139/85-89 mm Hg).  People who are overweight or obese.  People who are African American.  If you are 23-24 years of age, have your blood pressure checked every 3-5 years. If you are 67 years of age or older, have your blood pressure checked every year. You should have your blood pressure measured twice-once when you are at a hospital or clinic, and once when you are not at a hospital or clinic. Record the average of the two measurements. To check your blood pressure when you are not at a hospital or clinic, you can use:  An automated blood pressure machine at a pharmacy.  A home blood pressure monitor.  If you are between 39 years and 56 years old, ask your health care provider if you should  take aspirin to prevent strokes.  Have regular diabetes screenings. This involves taking a blood sample to check your fasting blood sugar level.  If you are at a normal weight and have a low risk for diabetes, have this test once every three years after 44 years of age.  If you are overweight and have a high risk for diabetes, consider being tested at a younger age or more often. Preventing infection Hepatitis B  If you have a higher risk for hepatitis B, you should be screened for this virus. You are considered at high risk for hepatitis B if:  You were born in a country where hepatitis B is common. Ask your health care provider which countries are considered high risk.  Your parents were born in a high-risk country, and you have not been immunized against hepatitis B (hepatitis B vaccine).  You have HIV or AIDS.  You use needles to inject street drugs.  You live with someone who has hepatitis B.  You have had sex with someone who has hepatitis B.  You get hemodialysis treatment.  You take certain medicines for conditions, including cancer, organ transplantation, and autoimmune conditions. Hepatitis C  Blood testing is recommended for:  Everyone born from [birth year] through 1964.  Anyone with known risk factors for hepatitis C. Sexually transmitted infections (STIs)  You should be screened for sexually transmitted infections (STIs) including gonorrhea and chlamydia if:  You are sexually active and are younger than 44 years of age.  You are older than 44 years of age and your health care provider tells you that you are at risk for this type of infection.  Your sexual activity has changed since you were last screened and you are at an increased risk for chlamydia or gonorrhea. Ask your health care provider if you are at risk.  If you do not have HIV, but are at risk, it may be recommended that you take a prescription medicine daily to prevent HIV infection. This is called  pre-exposure prophylaxis (PrEP). You are considered at risk if:  You are sexually active and do not regularly use condoms or know the HIV status of your partner(s).  You take drugs by injection.  You are sexually active with a partner who has HIV. Talk with your health care provider about whether you are at high risk of being infected with HIV. If you choose to begin PrEP, you should  first be tested for HIV. You should then be tested every 3 months for as long as you are taking PrEP. Pregnancy  If you are premenopausal and you may become pregnant, ask your health care provider about preconception counseling.  If you may become pregnant, take 400 to 800 micrograms (mcg) of folic acid every day.  If you want to prevent pregnancy, talk to your health care provider about birth control (contraception). Osteoporosis and menopause  Osteoporosis is a disease in which the bones lose minerals and strength with aging. This can result in serious bone fractures. Your risk for osteoporosis can be identified using a bone density scan.  If you are 59 years of age or older, or if you are at risk for osteoporosis and fractures, ask your health care provider if you should be screened.  Ask your health care provider whether you should take a calcium or vitamin D supplement to lower your risk for osteoporosis.  Menopause may have certain physical symptoms and risks.  Hormone replacement therapy may reduce some of these symptoms and risks. Talk to your health care provider about whether hormone replacement therapy is right for you. Follow these instructions at home:  Schedule regular health, dental, and eye exams.  Stay current with your immunizations.  Do not use any tobacco products including cigarettes, chewing tobacco, or electronic cigarettes.  If you are pregnant, do not drink alcohol.  If you are breastfeeding, limit how much and how often you drink alcohol.  Limit alcohol intake to no more  than 1 drink per day for nonpregnant women. One drink equals 12 ounces of beer, 5 ounces of wine, or 1 ounces of hard liquor.  Do not use street drugs.  Do not share needles.  Ask your health care provider for help if you need support or information about quitting drugs.  Tell your health care provider if you often feel depressed.  Tell your health care provider if you have ever been abused or do not feel safe at home. This information is not intended to replace advice given to you by your health care provider. Make sure you discuss any questions you have with your health care provider. Document Released: 06/14/2011 Document Revised: 05/06/2016 Document Reviewed: 09/02/2015  2017 [employer]vier

## 2016-12-10 NOTE — Progress Notes (Signed)
   Subjective:    Patient ID: Ana Freeman, female    DOB: June 21, 1971, 45 y.o.   MRN: [id number]  HPI The patient is a 45 YO female coming in for wellness. Some mild headaches.   PMH, [facility], social history reviewed and updated.   Review of Systems  Constitutional: Negative.   HENT: Negative.   Eyes: Negative.   Respiratory: Negative for cough, chest tightness and shortness of breath.   Cardiovascular: Negative for chest pain, palpitations and leg swelling.  Gastrointestinal: Negative for abdominal distention, abdominal pain, constipation, diarrhea, nausea and vomiting.  Musculoskeletal: Negative.   Skin: Negative.   Neurological: Positive for headaches.  Psychiatric/Behavioral: Negative.       Objective:   Physical Exam  Constitutional: She is oriented to person, place, and time. She appears well-developed and well-nourished.  HENT:  Head: Normocephalic and atraumatic.  Eyes: EOM are normal.  Neck: Normal range of motion.  Cardiovascular: Normal rate and regular rhythm.   Pulmonary/Chest: Effort normal and breath sounds normal. No respiratory distress. She has no wheezes. She has no rales.  Abdominal: Soft. Bowel sounds are normal. She exhibits no distension. There is no tenderness. There is no rebound.  Musculoskeletal: She exhibits no edema.  Neurological: She is alert and oriented to person, place, and time. Coordination normal.  Skin: Skin is warm and dry.  Psychiatric: She has a normal mood and affect.   Vitals:   12/10/16 0822  BP: 112/70  Pulse: 78  Resp: 12  Temp: 98.2 F (36.8 C)  TempSrc: Oral  SpO2: 100%  Weight: 186 lb (84.4 kg)  Height: 5\' 4"  (1.626 m)      Assessment & Plan:

## 2016-12-10 NOTE — Progress Notes (Signed)
Pre visit review using our clinic review tool, if applicable. No additional management support is needed unless otherwise documented below in the visit note. 

## 2016-12-10 NOTE — Assessment & Plan Note (Signed)
Not taking iron, checking CBC today and ferritin.

## 2016-12-10 NOTE — Assessment & Plan Note (Signed)
Slightly worse with weight gain and she will use otc meds at this time.

## 2017-01-07 ENCOUNTER — Telehealth: Payer: Self-pay

## 2017-01-07 NOTE — Telephone Encounter (Signed)
Patient calling stating that ears aren't feeling any better, also stating that her headaches are still occurring. Please advise

## 2017-01-07 NOTE — Telephone Encounter (Signed)
Would need visit as this was 1 month ago she was seen.

## 2017-01-07 NOTE — Telephone Encounter (Signed)
Patient contacted and stated awareness 

## 2017-04-01 ENCOUNTER — Other Ambulatory Visit (HOSPITAL_COMMUNITY): Payer: Self-pay | Admitting: Nurse Practitioner

## 2017-04-01 DIAGNOSIS — R202 Paresthesia of skin: Principal | ICD-10-CM

## 2017-04-01 DIAGNOSIS — R7989 Other specified abnormal findings of blood chemistry: Secondary | ICD-10-CM

## 2017-04-01 DIAGNOSIS — R200 Anesthesia of skin: Secondary | ICD-10-CM

## 2017-04-11 ENCOUNTER — Other Ambulatory Visit (HOSPITAL_COMMUNITY): Payer: Self-pay | Admitting: Nurse Practitioner

## 2017-04-21 ENCOUNTER — Encounter (HOSPITAL_COMMUNITY)
Admission: RE | Admit: 2017-04-21 | Discharge: 2017-04-21 | Disposition: A | Discharge status: Home / Self Care | Payer: BC Managed Care – PPO | Source: Ambulatory Visit | Attending: Nurse Practitioner | Admitting: Nurse Practitioner

## 2017-04-21 ENCOUNTER — Encounter (HOSPITAL_COMMUNITY): Admission: RE | Admit: 2017-04-21 | Payer: BC Managed Care – PPO | Source: Ambulatory Visit

## 2017-04-21 DIAGNOSIS — R7989 Other specified abnormal findings of blood chemistry: Secondary | ICD-10-CM | POA: Diagnosis present

## 2017-04-21 DIAGNOSIS — R200 Anesthesia of skin: Secondary | ICD-10-CM | POA: Insufficient documentation

## 2017-04-21 DIAGNOSIS — R202 Paresthesia of skin: Secondary | ICD-10-CM | POA: Diagnosis present

## 2017-04-21 MED ORDER — TECHNETIUM TC 99M SESTAMIBI GENERIC - CARDIOLITE
24.2000 | Freq: Once | INTRAVENOUS | Status: AC
  Administered 2017-04-21: 24.2 via INTRAVENOUS

## 2017-06-10 ENCOUNTER — Other Ambulatory Visit: Payer: Self-pay | Admitting: Otolaryngology

## 2017-07-04 ENCOUNTER — Inpatient Hospital Stay (HOSPITAL_COMMUNITY): Admission: RE | Admit: 2017-07-04 | Payer: BC Managed Care – PPO | Source: Ambulatory Visit

## 2017-07-08 ENCOUNTER — Encounter (HOSPITAL_COMMUNITY): Admission: RE | Payer: Self-pay | Source: Ambulatory Visit

## 2017-07-08 ENCOUNTER — Ambulatory Visit (HOSPITAL_COMMUNITY): Admission: RE | Admit: 2017-07-08 | Payer: BC Managed Care – PPO | Source: Ambulatory Visit | Admitting: Otolaryngology

## 2017-07-08 SURGERY — EXCISION, MASS, NECK
Anesthesia: General

## 2017-08-26 ENCOUNTER — Encounter (HOSPITAL_BASED_OUTPATIENT_CLINIC_OR_DEPARTMENT_OTHER): Payer: Self-pay | Admitting: *Deleted

## 2017-08-26 ENCOUNTER — Other Ambulatory Visit: Payer: Self-pay | Admitting: Surgery

## 2017-08-29 ENCOUNTER — Other Ambulatory Visit: Payer: Self-pay | Admitting: Surgery

## 2017-08-29 DIAGNOSIS — K824 Cholesterolosis of gallbladder: Secondary | ICD-10-CM

## 2017-08-29 NOTE — H&P (Signed)
  Philip TAMYKA BEZIO 08/26/2017 10:26 AM Location: [facility] Patient #: [id number] DOB: Jan 15, 1971 Single / Language: Ana Freeman / Race: Black or African American Female   History of Present Illness (Tierney Behl A. Ninfa Linden MD; 08/26/2017 10:39 AM) The patient is a 46 year old female who presents with a complaint of Mass. This is a patient I have seen last year for gallbladder polyps. She is referred today by Dr. Allyson Sabal for a mass on her right upper arm. It is been present for some time. 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 She reports that the mass is getting larger and causing some discomfort. She has had no issues regarding her gallbladder has no abdominal discomfort. She was first seen in evaluation and we could repeat her ultrasound to confirm stability of the 5 mm gallbladder polyp.   Allergies Alean Rinne, [place]; 08/26/2017 10:26 AM) No Known Drug Allergies 02/05/2016 Allergies Reconciled   Medication History Alean Rinne, [place]; 08/26/2017 10:27 AM) Poly-Iron 150 (150MG  Capsule, Oral) Active. NexIUM (40MG  Capsule DR, Oral) Active. Multivitamin Adult (Oral) Active. Medications Reconciled  Vitals Mardene Celeste King RMA; 08/26/2017 10:28 AM) 08/26/2017 10:27 AM Weight: 184.6 lb Height: 64in Body Surface Area: 1.89 m Body Mass Index: 31.69 kg/m  Temp.: 98.37F  Pulse: 80 (Regular)  BP: 120/70 (Sitting, Left Arm, Standard)       Physical Exam (Felicitas Sine A. Ninfa Linden MD; 08/26/2017 10:39 AM) The physical exam findings are as follows: Note:On exam, she is well appearing Lungs clear bilaterally Cardiovascular regular rate and rhythm There is a 2 cm firm hard mass superficially on her medial right upper arm. It is dark in appearance Her abdomen is soft and nontender    Assessment & Plan (Anett Ranker A. Ninfa Linden MD; 08/26/2017 10:40 AM) MASS OF SOFT TISSUE OF RIGHT UPPER EXTREMITY (R22.31) Impression: Because of the histologic  findings all shave biopsy, excision of this mass on her left upper arm is recommended for complete histologic evaluation to rule out a malignancy or sarcoma. I discussed this with her in detail. We discussed the risks of surgery which includes but is not limited to bleeding, infection, injury to surrounding structures, the need for further surgery if margins are positive for malignancy is found, postoperative recovery, etc. She understands and wishes to proceed with surgery. Regarding her gallbladder polyp, is recommended we get a repeat ultrasound of her gallbladder to evaluate stability of the polyp. If the polyp is larger, we may need to consider cholecystectomy

## 2017-08-30 ENCOUNTER — Ambulatory Visit (HOSPITAL_BASED_OUTPATIENT_CLINIC_OR_DEPARTMENT_OTHER): Payer: BC Managed Care – PPO | Admitting: Certified Registered"

## 2017-08-30 ENCOUNTER — Ambulatory Visit (HOSPITAL_BASED_OUTPATIENT_CLINIC_OR_DEPARTMENT_OTHER)
Admission: RE | Admit: 2017-08-30 | Discharge: 2017-08-30 | Disposition: A | Discharge status: Home / Self Care | Payer: BC Managed Care – PPO | Source: Ambulatory Visit | Attending: Surgery | Admitting: Surgery

## 2017-08-30 ENCOUNTER — Encounter (HOSPITAL_BASED_OUTPATIENT_CLINIC_OR_DEPARTMENT_OTHER): Payer: Self-pay | Admitting: Certified Registered"

## 2017-08-30 ENCOUNTER — Encounter (HOSPITAL_BASED_OUTPATIENT_CLINIC_OR_DEPARTMENT_OTHER)
Admission: RE | Disposition: A | Discharge status: Home / Self Care | Payer: Self-pay | Source: Ambulatory Visit | Attending: Surgery

## 2017-08-30 DIAGNOSIS — Z79899 Other long term (current) drug therapy: Secondary | ICD-10-CM | POA: Insufficient documentation

## 2017-08-30 DIAGNOSIS — D2361 Other benign neoplasm of skin of right upper limb, including shoulder: Secondary | ICD-10-CM | POA: Diagnosis not present

## 2017-08-30 DIAGNOSIS — E669 Obesity, unspecified: Secondary | ICD-10-CM | POA: Diagnosis not present

## 2017-08-30 DIAGNOSIS — D239 Other benign neoplasm of skin, unspecified: Secondary | ICD-10-CM | POA: Diagnosis present

## 2017-08-30 DIAGNOSIS — Z6832 Body mass index (BMI) 32.0-32.9, adult: Secondary | ICD-10-CM | POA: Diagnosis not present

## 2017-08-30 HISTORY — DX: Disorder of parathyroid gland, unspecified: E21.5

## 2017-08-30 HISTORY — PX: MASS EXCISION: SHX2000

## 2017-08-30 SURGERY — EXCISION MASS
Anesthesia: General | Site: Arm Upper | Laterality: Right

## 2017-08-30 MED ORDER — LIDOCAINE HCL (CARDIAC) 20 MG/ML IV SOLN
INTRAVENOUS | Status: DC | PRN
  Administered 2017-08-30: 60 mg via INTRAVENOUS

## 2017-08-30 MED ORDER — SCOPOLAMINE 1 MG/3DAYS TD PT72: 1.0000 | MEDICATED_PATCH | Freq: Once | TRANSDERMAL | Status: DC | PRN

## 2017-08-30 MED ORDER — PROPOFOL 500 MG/50ML IV EMUL
INTRAVENOUS | Status: AC
  Filled 2017-08-30: qty 50

## 2017-08-30 MED ORDER — CEFAZOLIN SODIUM-DEXTROSE 2-4 GM/100ML-% IV SOLN: 2.0000 g | INTRAVENOUS | Status: DC

## 2017-08-30 MED ORDER — CEFAZOLIN SODIUM-DEXTROSE 2-3 GM-% IV SOLR
INTRAVENOUS | Status: DC | PRN
  Administered 2017-08-30: 2 g via INTRAVENOUS

## 2017-08-30 MED ORDER — BUPIVACAINE-EPINEPHRINE 0.5% -1:200000 IJ SOLN
INTRAMUSCULAR | Status: DC | PRN
  Administered 2017-08-30: 20 mL

## 2017-08-30 MED ORDER — MIDAZOLAM HCL 2 MG/2ML IJ SOLN
INTRAMUSCULAR | Status: AC
  Filled 2017-08-30: qty 2

## 2017-08-30 MED ORDER — LACTATED RINGERS IV SOLN: INTRAVENOUS | Status: DC

## 2017-08-30 MED ORDER — CHLORHEXIDINE GLUCONATE CLOTH 2 % EX PADS: 6.0000 | MEDICATED_PAD | Freq: Once | CUTANEOUS | Status: DC

## 2017-08-30 MED ORDER — ONDANSETRON HCL 4 MG/2ML IJ SOLN
INTRAMUSCULAR | Status: DC | PRN
  Administered 2017-08-30: 4 mg via INTRAVENOUS

## 2017-08-30 MED ORDER — LIDOCAINE 2% (20 MG/ML) 5 ML SYRINGE
INTRAMUSCULAR | Status: AC
  Filled 2017-08-30: qty 5

## 2017-08-30 MED ORDER — ONDANSETRON HCL 4 MG/2ML IJ SOLN
INTRAMUSCULAR | Status: AC
  Filled 2017-08-30: qty 2

## 2017-08-30 MED ORDER — CEFAZOLIN SODIUM-DEXTROSE 2-4 GM/100ML-% IV SOLN
INTRAVENOUS | Status: AC
  Filled 2017-08-30: qty 100

## 2017-08-30 MED ORDER — PROMETHAZINE HCL 25 MG/ML IJ SOLN: 6.2500 mg | INTRAMUSCULAR | Status: DC | PRN

## 2017-08-30 MED ORDER — OXYCODONE HCL 5 MG PO TABS: 5.0000 mg | ORAL_TABLET | Freq: Four times a day (QID) | ORAL | 0 refills | Status: DC | PRN

## 2017-08-30 MED ORDER — BUPIVACAINE-EPINEPHRINE (PF) 0.5% -1:200000 IJ SOLN
INTRAMUSCULAR | Status: AC
  Filled 2017-08-30: qty 60

## 2017-08-30 MED ORDER — HYDROCODONE-ACETAMINOPHEN 7.5-325 MG PO TABS: 1.0000 | ORAL_TABLET | Freq: Once | ORAL | Status: DC | PRN

## 2017-08-30 MED ORDER — FENTANYL CITRATE (PF) 100 MCG/2ML IJ SOLN: 25.0000 ug | INTRAMUSCULAR | Status: DC | PRN

## 2017-08-30 MED ORDER — DEXAMETHASONE SODIUM PHOSPHATE 4 MG/ML IJ SOLN
INTRAMUSCULAR | Status: DC | PRN
  Administered 2017-08-30: 10 mg via INTRAVENOUS

## 2017-08-30 MED ORDER — LACTATED RINGERS IV SOLN
INTRAVENOUS | Status: DC | PRN
  Administered 2017-08-30: 09:00:00 via INTRAVENOUS

## 2017-08-30 MED ORDER — FENTANYL CITRATE (PF) 100 MCG/2ML IJ SOLN
50.0000 ug | INTRAMUSCULAR | Status: DC | PRN
  Administered 2017-08-30: 100 ug via INTRAVENOUS

## 2017-08-30 MED ORDER — PROPOFOL 10 MG/ML IV BOLUS
INTRAVENOUS | Status: DC | PRN
  Administered 2017-08-30: 150 mg via INTRAVENOUS

## 2017-08-30 MED ORDER — DEXAMETHASONE SODIUM PHOSPHATE 10 MG/ML IJ SOLN
INTRAMUSCULAR | Status: AC
  Filled 2017-08-30: qty 1

## 2017-08-30 MED ORDER — MIDAZOLAM HCL 2 MG/2ML IJ SOLN
1.0000 mg | INTRAMUSCULAR | Status: DC | PRN
  Administered 2017-08-30: 2 mg via INTRAVENOUS

## 2017-08-30 MED ORDER — FENTANYL CITRATE (PF) 100 MCG/2ML IJ SOLN
INTRAMUSCULAR | Status: AC
  Filled 2017-08-30: qty 2

## 2017-08-30 SURGICAL SUPPLY — 43 items
ADH SKN CLS APL DERMABOND .7 (GAUZE/BANDAGES/DRESSINGS) ×2
BLADE CLIPPER SURG (BLADE) IMPLANT
BLADE HEX COATED 2.75 (ELECTRODE) ×3 IMPLANT
BLADE SURG 15 STRL LF DISP TIS (BLADE) ×1 IMPLANT
BLADE SURG 15 STRL SS (BLADE) ×3
CANISTER SUCT 1200ML W/VALVE (MISCELLANEOUS) IMPLANT
CHLORAPREP W/TINT 26ML (MISCELLANEOUS) ×3 IMPLANT
COVER BACK TABLE 60X90IN (DRAPES) ×3 IMPLANT
COVER MAYO STAND STRL (DRAPES) ×3 IMPLANT
DECANTER SPIKE VIAL GLASS SM (MISCELLANEOUS) IMPLANT
DERMABOND ADVANCED (GAUZE/BANDAGES/DRESSINGS) ×4
DERMABOND ADVANCED .7 DNX12 (GAUZE/BANDAGES/DRESSINGS) ×2 IMPLANT
DRAPE LAPAROTOMY 100X72 PEDS (DRAPES) ×3 IMPLANT
DRAPE UTILITY XL STRL (DRAPES) ×3 IMPLANT
ELECT REM PT RETURN 9FT ADLT (ELECTROSURGICAL) ×3
ELECTRODE REM PT RTRN 9FT ADLT (ELECTROSURGICAL) ×1 IMPLANT
GLOVE BIOGEL PI IND STRL 7.0 (GLOVE) IMPLANT
GLOVE BIOGEL PI INDICATOR 7.0 (GLOVE) ×2
GLOVE ECLIPSE 6.5 STRL STRAW (GLOVE) ×2 IMPLANT
GLOVE SURG SIGNA 7.5 PF LTX (GLOVE) ×3 IMPLANT
GOWN STRL REUS W/ TWL LRG LVL3 (GOWN DISPOSABLE) ×1 IMPLANT
GOWN STRL REUS W/ TWL XL LVL3 (GOWN DISPOSABLE) ×1 IMPLANT
GOWN STRL REUS W/TWL LRG LVL3 (GOWN DISPOSABLE) ×3
GOWN STRL REUS W/TWL XL LVL3 (GOWN DISPOSABLE) ×3
NDL HYPO 25X1 1.5 SAFETY (NEEDLE) ×1 IMPLANT
NEEDLE HYPO 25X1 1.5 SAFETY (NEEDLE) ×3 IMPLANT
NS IRRIG 1000ML POUR BTL (IV SOLUTION) ×2 IMPLANT
PACK BASIN DAY SURGERY FS (CUSTOM PROCEDURE TRAY) ×3 IMPLANT
PENCIL BUTTON HOLSTER BLD 10FT (ELECTRODE) ×3 IMPLANT
SLEEVE SCD COMPRESS KNEE MED (MISCELLANEOUS) ×2 IMPLANT
SPONGE LAP 4X18 X RAY DECT (DISPOSABLE) ×3 IMPLANT
SUT MNCRL AB 4-0 PS2 18 (SUTURE) ×2 IMPLANT
SUT VIC AB 2-0 SH 27 (SUTURE)
SUT VIC AB 2-0 SH 27XBRD (SUTURE) IMPLANT
SUT VIC AB 3-0 SH 27 (SUTURE)
SUT VIC AB 3-0 SH 27X BRD (SUTURE) IMPLANT
SYR BULB 3OZ (MISCELLANEOUS) ×2 IMPLANT
SYR CONTROL 10ML LL (SYRINGE) ×3 IMPLANT
TOWEL OR 17X24 6PK STRL BLUE (TOWEL DISPOSABLE) ×3 IMPLANT
TOWEL OR NON WOVEN STRL DISP B (DISPOSABLE) ×3 IMPLANT
TUBE CONNECTING 20'X1/4 (TUBING)
TUBE CONNECTING 20X1/4 (TUBING) IMPLANT
YANKAUER SUCT BULB TIP NO VENT (SUCTIONS) IMPLANT

## 2017-08-30 NOTE — Anesthesia Postprocedure Evaluation (Signed)
Anesthesia Post Note  Patient: Ana Freeman  Procedure(s) Performed: Procedure(s) (LRB): EXCISION RIGHT UPPER ARM MASS (Right)     Patient location during evaluation: PACU Anesthesia Type: General Level of consciousness: awake and alert Pain management: pain level controlled Vital Signs Assessment: post-procedure vital signs reviewed and stable Respiratory status: spontaneous breathing, nonlabored ventilation, respiratory function stable and patient connected to nasal cannula oxygen Cardiovascular status: blood pressure returned to baseline and stable Postop Assessment: no apparent nausea or vomiting Anesthetic complications: no    Last Vitals:  Vitals:   08/30/17 1045 08/30/17 1100  BP: 119/81 117/88  Pulse:  63  Resp: 15 15  Temp:    SpO2:  98%    Last Pain:  Vitals:   08/30/17 1045  PainSc: 0-No pain                 Tiajuana Amass

## 2017-08-30 NOTE — Interval H&P Note (Signed)
History and Physical Interval Note: no change in H and P  08/30/2017 9:24 AM  Ana Freeman  has presented today for surgery, with the diagnosis of RIGHT UPPER ARM MASS   The various methods of treatment have been discussed with the patient and family. After consideration of risks, benefits and other options for treatment, the patient has consented to  Procedure(s): EXCISION RIGHT UPPER ARM MASS (Right) as a surgical intervention .  The patient's history has been reviewed, patient examined, no change in status, stable for surgery.  I have reviewed the patient's chart and labs.  Questions were answered to the patient's satisfaction.     Sotiria Keast A

## 2017-08-30 NOTE — Transfer of Care (Signed)
Immediate Anesthesia Transfer of Care Note  Patient: Ana Freeman  Procedure(s) Performed: Procedure(s): EXCISION RIGHT UPPER ARM MASS (Right)  Patient Location: PACU  Anesthesia Type:General  Level of Consciousness: awake and patient cooperative  Airway & Oxygen Therapy: Patient Spontanous Breathing and Patient connected to face mask oxygen  Post-op Assessment: Report given to RN and Post -op Vital signs reviewed and stable  Post vital signs: Reviewed and stable  Last Vitals: There were no vitals filed for this visit.  Last Pain: There were no vitals filed for this visit.       Complications: No apparent anesthesia complications

## 2017-08-30 NOTE — Anesthesia Preprocedure Evaluation (Addendum)
Anesthesia Evaluation  Patient identified by MRN, date of birth, ID band Patient awake    Reviewed: Allergy & Precautions, NPO status , Patient's Chart, lab work & pertinent test results  Airway Mallampati: II  TM Distance: >3 FB Neck ROM: Full    Dental   Pulmonary neg pulmonary ROS,    Pulmonary exam normal        Cardiovascular negative cardio ROS Normal cardiovascular exam     Neuro/Psych negative neurological ROS     GI/Hepatic Neg liver ROS, GERD  ,  Endo/Other  negative endocrine ROS  Renal/GU negative Renal ROS     Musculoskeletal   Abdominal (+) + obese,   Peds  Hematology negative hematology ROS (+)   Anesthesia Other Findings   Reproductive/Obstetrics                            Anesthesia Physical Anesthesia Plan  ASA: II  Anesthesia Plan: General   Post-op Pain Management:    Induction: Intravenous  PONV Risk Score and Plan: 3 and Ondansetron, Dexamethasone, Midazolam and Treatment may vary due to age or medical condition  Airway Management Planned: LMA  Additional Equipment:   Intra-op Plan:   Post-operative Plan: Extubation in OR  Informed Consent: I have reviewed the patients History and Physical, chart, labs and discussed the procedure including the risks, benefits and alternatives for the proposed anesthesia with the patient or authorized representative who has indicated his/her understanding and acceptance.   Dental advisory given  Plan Discussed with: CRNA  Anesthesia Plan Comments:        Anesthesia Quick Evaluation

## 2017-08-30 NOTE — Anesthesia Procedure Notes (Signed)
Procedure Name: LMA Insertion Date/Time: 08/30/2017 9:46 AM Performed by: Edge Mauger D Pre-anesthesia Checklist: Patient identified, Emergency Drugs available, Suction available and Patient being monitored Patient Re-evaluated:Patient Re-evaluated prior to induction Oxygen Delivery Method: Circle system utilized Preoxygenation: Pre-oxygenation with 100% oxygen Induction Type: IV induction Ventilation: Mask ventilation without difficulty LMA: LMA inserted LMA Size: 3.0 Number of attempts: 1 Airway Equipment and Method: Bite block Placement Confirmation: positive ETCO2 Tube secured with: Tape Dental Injury: Teeth and Oropharynx as per pre-operative assessment

## 2017-08-30 NOTE — Discharge Instructions (Signed)
Ok to shower tomorrow  No vigorous activity for 2 Weeks  Ice pack, tylenol, ibuprofen also for pain   Post Anesthesia Home Care Instructions  Activity: Get plenty of rest for the remainder of the day. A responsible individual must stay with you for 24 hours following the procedure.  For the next 24 hours, DO NOT: -Drive a car -Paediatric nurse -Drink alcoholic beverages -Take any medication unless instructed by your physician -Make any legal decisions or sign important papers.  Meals: Start with liquid foods such as gelatin or soup. Progress to regular foods as tolerated. Avoid greasy, spicy, heavy foods. If nausea and/or vomiting occur, drink only clear liquids until the nausea and/or vomiting subsides. Call your physician if vomiting continues.  Special Instructions/Symptoms: Your throat may feel dry or sore from the anesthesia or the breathing tube placed in your throat during surgery. If this causes discomfort, gargle with warm salt water. The discomfort should disappear within 24 hours.  If you had a scopolamine patch placed behind your ear for the management of post- operative nausea and/or vomiting:  1. The medication in the patch is effective for 72 hours, after which it should be removed.  Wrap patch in a tissue and discard in the trash. Wash hands thoroughly with soap and water. 2. You may remove the patch earlier than 72 hours if you experience unpleasant side effects which may include dry mouth, dizziness or visual disturbances. 3. Avoid touching the patch. Wash your hands with soap and water after contact with the patch.

## 2017-08-30 NOTE — Op Note (Signed)
EXCISION RIGHT UPPER ARM MASS  Procedure Note  Amberlie L Blodgett 08/30/2017   Pre-op Diagnosis: RIGHT UPPER ARM MASS      Post-op Diagnosis: same  Procedure(s): EXCISION RIGHT UPPER ARM MASS (2.5 cm)  Surgeon(s): Coralie Keens, MD  Anesthesia: General  Staff:  Circulator: Faythe Dingwall, RN Scrub Person: Lisbeth Ply, RN  Estimated Blood Loss: Minimal               Specimens: sent to path  Indications: This is a 46 year old female who presents with a right upper arm mass. She underwent a shave biopsy by the dermatologist. The pathology suggested a dermatofibroma. The decision has been made to proceed with wide excision.   Procedure: The patient was brought to the operating room and identified as the correct patient. She was placed supine on the operating room table and general anesthesia was induced. Her right arm was then prepped and draped in the usual sterile fashion. I anesthetized skin with Marcaine. I made an elliptical incision around the 2.5 cm mass. I then dissected down into the septated tissue with electrocautery. I then widely excised the mass with the electrocautery. Once the mass was completely excised, it was sent to pathology for evaluation. I anesthetized wound further with Marcaine. Hemostasis was achieved with cautery. I then closed the subcutaneous tissue with interrupted 3-0 Vicryl sutures and closed the skin with a running 4-0 Monocryl. Skin glue was then applied. The patient tolerated the procedure well. All the counts were correct at the end procedure. The patient was then extubated in the operating room and taken in a stable condition to the recovery room.          Kong Packett A   Date: 08/30/2017  Time: 10:19 AM

## 2017-08-31 ENCOUNTER — Encounter (HOSPITAL_BASED_OUTPATIENT_CLINIC_OR_DEPARTMENT_OTHER): Payer: Self-pay | Admitting: Surgery

## 2017-12-08 ENCOUNTER — Other Ambulatory Visit: Payer: Self-pay | Admitting: Obstetrics and Gynecology

## 2017-12-08 DIAGNOSIS — Z1231 Encounter for screening mammogram for malignant neoplasm of breast: Secondary | ICD-10-CM

## 2017-12-22 ENCOUNTER — Other Ambulatory Visit: Payer: Self-pay | Admitting: Internal Medicine

## 2017-12-30 ENCOUNTER — Ambulatory Visit: Payer: BC Managed Care – PPO

## 2017-12-30 ENCOUNTER — Ambulatory Visit
Admission: RE | Admit: 2017-12-30 | Discharge: 2017-12-30 | Disposition: A | Discharge status: Home / Self Care | Payer: BC Managed Care – PPO | Source: Ambulatory Visit | Attending: Obstetrics and Gynecology | Admitting: Obstetrics and Gynecology

## 2017-12-30 DIAGNOSIS — Z1231 Encounter for screening mammogram for malignant neoplasm of breast: Secondary | ICD-10-CM

## 2018-01-02 ENCOUNTER — Other Ambulatory Visit: Payer: Self-pay | Admitting: Obstetrics and Gynecology

## 2018-01-02 DIAGNOSIS — R928 Other abnormal and inconclusive findings on diagnostic imaging of breast: Secondary | ICD-10-CM

## 2018-01-03 ENCOUNTER — Ambulatory Visit: Payer: BC Managed Care – PPO

## 2018-01-05 ENCOUNTER — Ambulatory Visit
Admission: RE | Admit: 2018-01-05 | Discharge: 2018-01-05 | Disposition: A | Discharge status: Home / Self Care | Payer: BC Managed Care – PPO | Source: Ambulatory Visit | Attending: Obstetrics and Gynecology | Admitting: Obstetrics and Gynecology

## 2018-01-05 DIAGNOSIS — R928 Other abnormal and inconclusive findings on diagnostic imaging of breast: Secondary | ICD-10-CM

## 2018-02-19 ENCOUNTER — Emergency Department (HOSPITAL_COMMUNITY): Payer: BC Managed Care – PPO

## 2018-02-19 ENCOUNTER — Other Ambulatory Visit: Payer: Self-pay

## 2018-02-19 ENCOUNTER — Encounter (HOSPITAL_COMMUNITY): Payer: Self-pay

## 2018-02-19 ENCOUNTER — Emergency Department (HOSPITAL_COMMUNITY)
Admission: EM | Admit: 2018-02-19 | Discharge: 2018-02-19 | Disposition: A | Discharge status: Home / Self Care | Payer: BC Managed Care – PPO | Attending: Emergency Medicine | Admitting: Emergency Medicine

## 2018-02-19 DIAGNOSIS — R079 Chest pain, unspecified: Secondary | ICD-10-CM | POA: Diagnosis present

## 2018-02-19 DIAGNOSIS — R200 Anesthesia of skin: Secondary | ICD-10-CM | POA: Diagnosis not present

## 2018-02-19 LAB — CBC
HCT: 38 % (ref 36.0–46.0)
Hemoglobin: 12.5 g/dL (ref 12.0–15.0)
MCH: 28.4 pg (ref 26.0–34.0)
MCHC: 32.9 g/dL (ref 30.0–36.0)
MCV: 86.4 fL (ref 78.0–100.0)
Platelets: 268 10*3/uL (ref 150–400)
RBC: 4.4 MIL/uL (ref 3.87–5.11)
RDW: 13.3 % (ref 11.5–15.5)
WBC: 3.6 10*3/uL — ABNORMAL LOW (ref 4.0–10.5)

## 2018-02-19 LAB — BASIC METABOLIC PANEL
Anion gap: 9 (ref 5–15)
BUN: 12 mg/dL (ref 6–20)
CO2: 20 mmol/L — ABNORMAL LOW (ref 22–32)
Calcium: 11 mg/dL — ABNORMAL HIGH (ref 8.9–10.3)
Chloride: 108 mmol/L (ref 101–111)
Creatinine, Ser: 0.93 mg/dL (ref 0.44–1.00)
GFR calc Af Amer: >60 mL/min (ref >60)
GFR calc non Af Amer: >60 mL/min (ref >60)
Glucose, Bld: 99 mg/dL (ref 65–99)
Potassium: 4.6 mmol/L (ref 3.5–5.1)
Sodium: 137 mmol/L (ref 135–145)

## 2018-02-19 LAB — I-STAT BETA HCG BLOOD, ED (MC, WL, AP ONLY): I-stat hCG, quantitative: <5 m[IU]/mL (ref <5)

## 2018-02-19 LAB — I-STAT TROPONIN, ED
Troponin i, poc: 0 ng/mL (ref 0.00–0.08)
Troponin i, poc: 0 ng/mL (ref 0.00–0.08)

## 2018-02-19 MED ORDER — SUCRALFATE 1 G PO TABS: 1.0000 g | ORAL_TABLET | Freq: Three times a day (TID) | ORAL | 0 refills | Status: DC

## 2018-02-19 NOTE — Discharge Instructions (Signed)
Take nexium daily for acid reflux. Take carafate daily. Follow up with cardiology next week. Return if worsening symptoms.

## 2018-02-19 NOTE — ED Triage Notes (Signed)
She c/o occasional palpitations plus dull intermittent ache at central chest plus mild, intermittent shortness of breath x 2 days. Her skin is normal, warm and dry. EKG/labs performed at triage.

## 2018-02-19 NOTE — ED Provider Notes (Signed)
[facility] DEPT Provider Note   CSN: [id number] Arrival date & time: 02/19/18  1200     History   Chief Complaint Chief Complaint  Patient presents with  . Chest Pain    HPI Ana Freeman is a 47 y.o. female.  HPI  Ana Freeman is a 47 y.o. female with history of acid reflux, anemia, presents to emergency department with complaint of chest pain.  Patient states she had an episode of chest pain 2 days ago which resolved in its own, and again started feeling left-sided chest pressure when she woke up this morning.  She states one pain was not improving she noticed that she had some tingling in the left arm and left leg.  She reports similar symptoms 3 years ago and states she had followed up with cardiology and had a stress test.  Stress test is negative according to the records.  She denies any exertional symptoms.  Denies any shortness of breath.  No nausea, vomiting, diaphoresis, dizziness.  She states pain lasted all through the waiting room and resolved once she got in the room.  She denies any personal history of heart disease.  She denies any history of hypertension, diabetes.  She does not smoke.  She denies any family history of heart disease.  She denies any recent travel or surgeries.  Denies any elevation in her cholesterol.  She did not try any medications prior to coming in.  She does have history of acid reflux and takes Nexium.   Past Medical History:  Diagnosis Date  . Anemia   . GERD (gastroesophageal reflux disease)   . Numbness of lower extremity   . Parathyroid abnormality [facility])     Patient Active Problem List   Diagnosis Date Noted  . Routine general medical examination at a health care facility 12/10/2016  . GERD (gastroesophageal reflux disease) 10/28/2015  . Nonspecific abnormal electrocardiogram (ECG) (EKG) 02/11/2015  . Fibroid uterus 01/17/2015  . Anemia, iron deficiency 12/20/2014    Past Surgical History:    Procedure Laterality Date  . DILATION AND EVACUATION N/A 02/12/2016   Procedure: DILATATION AND EVACUATION WITH SUCTION ;  Surgeon: Eldred Manges, MD;  Location: [facility];  Service: Gynecology;  Laterality: N/A;  . HYSTEROSCOPY WITH RESECTOSCOPE N/A 02/12/2016   Procedure: HYSTEROSCOPIC MYOMECTOMY WITH RESECTOSCOPE;  Surgeon: Eldred Manges, MD;  Location: [facility];  Service: Gynecology;  Laterality: N/A;  . MASS EXCISION Right 08/30/2017   Procedure: EXCISION RIGHT UPPER ARM MASS;  Surgeon: Coralie Keens, MD;  Location: [facility];  Service: General;  Laterality: Right;  . MYOMECTOMY N/A 02/12/2016   Procedure: ABDOMINAL MYOMECTOMY;  Surgeon: Eldred Manges, MD;  Location: [facility];  Service: Gynecology;  Laterality: N/A;  . UPPER GI ENDOSCOPY    . WISDOM TOOTH EXTRACTION      OB History    No data available       Home Medications    Prior to Admission medications   Medication Sig Start Date End Date Taking? Authorizing Provider  ferrous sulfate 325 (65 FE) MG EC tablet Take 325 mg by mouth 3 (three) times daily with meals.    [provider]  Multiple Vitamin (MULTIVITAMIN WITH MINERALS) TABS tablet Take 2 tablets by mouth daily.    [provider]  oxyCODONE (OXY IR/ROXICODONE) 5 MG immediate release tablet Take 1-2 tablets (5-10 mg total) by mouth every 6 (six) hours as needed for moderate pain, severe pain  or breakthrough pain. 08/30/17   Coralie Keens, MD    Family History Family History  Problem Relation Age of Onset  . Hypertension Mother   . Diabetes Father   . Anemia Sister     Social History Social History   Tobacco Use  . Smoking status: Never Smoker  . Smokeless tobacco: Never Used  Substance Use Topics  . Alcohol use: Yes    Alcohol/week: 0.0 oz    Comment: occ  . Drug use: No     Allergies   Patient has no known allergies.   Review of Systems Review of Systems  Constitutional: Negative for chills and fever.   Respiratory: Positive for chest tightness. Negative for cough and shortness of breath.   Cardiovascular: Positive for chest pain. Negative for palpitations and leg swelling.  Gastrointestinal: Negative for abdominal pain, diarrhea, nausea and vomiting.  Genitourinary: Negative for dysuria, flank pain, pelvic pain, vaginal bleeding, vaginal discharge and vaginal pain.  Musculoskeletal: Negative for arthralgias, myalgias, neck pain and neck stiffness.  Skin: Negative for rash.  Neurological: Positive for numbness. Negative for dizziness, weakness and headaches.  All other systems reviewed and are negative.    Physical Exam Updated Vital Signs BP 115/83 (BP Location: Left Arm)   Pulse 69   Temp 98.2 F (36.8 C) (Oral)   Resp (!) 23   Ht 5\' 4"  (1.626 m)   Wt 81.3 kg (179 lb 3.2 oz)   LMP 01/29/2018 (Approximate)   SpO2 100%   BMI 30.76 kg/m   Physical Exam  Constitutional: She is oriented to person, place, and time. She appears well-developed and well-nourished. No distress.  HENT:  Head: Normocephalic.  Eyes: Conjunctivae are normal.  Neck: Neck supple.  Cardiovascular: Normal rate, regular rhythm and normal heart sounds.  Pulmonary/Chest: Effort normal and breath sounds normal. No respiratory distress. She has no wheezes. She has no rales. She exhibits no tenderness.  Abdominal: Soft. Bowel sounds are normal. She exhibits no distension. There is no tenderness. There is no rebound.  Musculoskeletal: She exhibits no edema.  Neurological: She is alert and oriented to person, place, and time.  Skin: Skin is warm and dry.  Psychiatric: She has a normal mood and affect. Her behavior is normal.  Nursing note and vitals reviewed.    ED Treatments / Results  Labs (all labs ordered are listed, but only abnormal results are displayed) Labs Reviewed  BASIC METABOLIC PANEL - Abnormal; Notable for the following components:      Result Value   CO2 20 (*)    Calcium 11.0 (*)    All  other components within normal limits  CBC - Abnormal; Notable for the following components:   WBC 3.6 (*)    All other components within normal limits  I-STAT TROPONIN, ED  I-STAT BETA HCG BLOOD, ED ([facility], [facility], [facility] ONLY)    EKG  EKG Interpretation  Date/Time:  Sunday February 19 2018 14:41:11 EDT Ventricular Rate:  66 PR Interval:    QRS Duration: 86 QT Interval:  347 QTC Calculation: 364 R Axis:   42 Text Interpretation:  Sinus rhythm Abnormal R-wave progression, early transition Nonspecific T abnormalities, anterior leads agree. Confirmed by Charlesetta Shanks [phone]) on 02/19/2018 2:56:25 PM       Radiology Dg Chest 2 View  Result Date: 02/19/2018 CLINICAL DATA:  Chest pain, palpitations EXAM: CHEST - 2 VIEW COMPARISON:  None. FINDINGS: Heart and mediastinal contours are within normal limits. No focal opacities or effusions. No acute  bony abnormality. IMPRESSION: No active cardiopulmonary disease. Electronically Signed   By: Rolm Baptise M.D.   On: 02/19/2018 12:45    Procedures Procedures (including critical care time)  Medications Ordered in ED Medications - No data to display   Initial Impression / Assessment and Plan / ED Course  I have reviewed the triage vital signs and the nursing notes.  Pertinent labs & imaging results that were available during my care of the patient were reviewed by me and considered in my medical decision making (see chart for details).     Pt with left sided chest pain, now resolved. ECG showing anterior lead T wave inversion, however, unchanged from 3 years ago when she had a negative stress test. Her pain is atypical. Heart score is 1 - age. Initial trop negative. Will monitor and check delta trop.   4:24 PM Pt's repeat trop is 0. Her VS have been normal here. She did have another episode of CP and ECG was performed during that time, unchanged. Interestingly CP started right after I told her that her initial ECG was not completely normal. We  discussed stress and anxiety being possible cause of her symptoms. Also considered acid reflux. Will add carafate to nexium she is already on. Will have her follow up closely with cardiology. Return precautions discussed.   Vitals:   02/19/18 1212 02/19/18 1357 02/19/18 1430  BP: 116/78 115/83 122/80  Pulse: 75 69 64  Resp: 16 (!) 23 11  Temp: 98.2 F (36.8 C)    TempSrc: Oral    SpO2: 100% 100% 100%  Weight:  81.3 kg (179 lb 3.2 oz)   Height:  5\' 4"  (1.626 m)      Final Clinical Impressions(s) / ED Diagnoses   Final diagnoses:  Nonspecific chest pain    ED Discharge Orders        Ordered    sucralfate (CARAFATE) 1 g tablet  3 times daily with meals & bedtime     02/19/18 1625       Jeannett Senior, PA-C 02/19/18 1629    Charlesetta Shanks, MD 02/23/18 1704

## 2018-02-19 NOTE — ED Notes (Signed)
Bed: WA05 Expected date:  Expected time:  Means of arrival:  Comments: 

## 2018-03-02 ENCOUNTER — Ambulatory Visit: Payer: BC Managed Care – PPO | Admitting: Cardiology

## 2018-03-15 ENCOUNTER — Ambulatory Visit: Payer: BC Managed Care – PPO | Admitting: Physician Assistant

## 2018-03-28 ENCOUNTER — Ambulatory Visit: Payer: BC Managed Care – PPO | Admitting: Cardiology

## 2018-07-19 ENCOUNTER — Ambulatory Visit: Payer: BC Managed Care – PPO | Admitting: Cardiovascular Disease

## 2018-07-19 ENCOUNTER — Encounter: Payer: Self-pay | Admitting: Cardiovascular Disease

## 2018-07-19 VITALS — BP 106/64 | HR 71 | Ht 64.0 in | Wt 178.0 lb

## 2018-07-19 DIAGNOSIS — R079 Chest pain, unspecified: Secondary | ICD-10-CM | POA: Insufficient documentation

## 2018-07-19 DIAGNOSIS — Z13220 Encounter for screening for lipoid disorders: Secondary | ICD-10-CM

## 2018-07-19 DIAGNOSIS — R072 Precordial pain: Secondary | ICD-10-CM

## 2018-07-19 DIAGNOSIS — R0789 Other chest pain: Secondary | ICD-10-CM

## 2018-07-19 DIAGNOSIS — E785 Hyperlipidemia, unspecified: Secondary | ICD-10-CM | POA: Diagnosis not present

## 2018-07-19 HISTORY — DX: Hyperlipidemia, unspecified: E78.5

## 2018-07-19 MED ORDER — METOPROLOL TARTRATE 25 MG PO TABS: ORAL_TABLET | ORAL | 0 refills | Status: DC

## 2018-07-19 NOTE — Progress Notes (Signed)
Cardiology Office Note   Date:  07/19/2018   ID:  Ana Freeman, DOB Jun 25, 1971, MRN [id number]  PCP:  Bartholome Bill, MD  Cardiologist:   Skeet Latch, MD   Chief Complaint  Patient presents with  . Follow-up  . Chest Pain  . Edema    A little.      History of Present Illness: Ana Freeman is a 47 y.o. female with GERD and anemia who is being seen today for the evaluation of chest pain.  She was seen in the ED 02/2018 with chest pain.  She has been experiencing intermittent chest pain for years.  She first noticed it in 2015.  The episodes occur almost 5 days/week.  She sometimes has chest tightness but also has numbness and tingling that radiates down her left arm and leg.  There is no associated shortness of breath, nausea, or diaphoresis.  She notices it almost every time she exercises, especially in Zumba class.  However it also sometimes occur sporadically.  She has noticed it when lying down and sometimes has associated palpitations.  She denies any lightheadedness or dizziness.  In the past she was told that she had a stomach ulcer and that her symptoms were related to GERD.  She saw Dr. Einar Gip in 2016 and had an exercise Myoview that was negative for ischemia.  She also had an echocardiogram that she reports was normal though those records are not available at this time.  She was told that she should have a cardiac catheterization but deferred it.  In the emergency department she had negative cardiac enzymes and EKG was negative.  She was referred to cardiology for follow-up.  She has not noted any lower extremity edema, orthopnea, or PND.   Past Medical History:  Diagnosis Date  . Anemia   . GERD (gastroesophageal reflux disease)   . Hyperlipidemia 07/19/2018  . Numbness of lower extremity   . Parathyroid abnormality [facility])     Past Surgical History:  Procedure Laterality Date  . DILATION AND EVACUATION N/A 02/12/2016   Procedure: DILATATION AND EVACUATION  WITH SUCTION ;  Surgeon: Eldred Manges, MD;  Location: [facility];  Service: Gynecology;  Laterality: N/A;  . HYSTEROSCOPY WITH RESECTOSCOPE N/A 02/12/2016   Procedure: HYSTEROSCOPIC MYOMECTOMY WITH RESECTOSCOPE;  Surgeon: Eldred Manges, MD;  Location: [facility];  Service: Gynecology;  Laterality: N/A;  . MASS EXCISION Right 08/30/2017   Procedure: EXCISION RIGHT UPPER ARM MASS;  Surgeon: Coralie Keens, MD;  Location: [facility];  Service: General;  Laterality: Right;  . MYOMECTOMY N/A 02/12/2016   Procedure: ABDOMINAL MYOMECTOMY;  Surgeon: Eldred Manges, MD;  Location: [facility];  Service: Gynecology;  Laterality: N/A;  . UPPER GI ENDOSCOPY    . WISDOM TOOTH EXTRACTION       Current Outpatient Medications  Medication Sig Dispense Refill  . fluocinonide ointment (LIDEX) 5.42 % Apply 1 application topically 4 (four) times a week.    . Iron-FA-B Cmp-C-Biot-Probiotic (FUSION PLUS) CAPS Take 1 capsule by mouth daily.  3  . valACYclovir (VALTREX) 1000 MG tablet Take 2 g by mouth every 12 (twelve) hours as needed for other. Cold sores    . metoprolol tartrate (LOPRESSOR) 25 MG tablet TAKE 1 TABLET BY MOUTH 1 HOUR PRIOR TO PROCEDURE 1 tablet 0   No current facility-administered medications for this visit.     Allergies:   Patient has no known allergies.    Social History:  The  patient  reports that she has never smoked. She has never used smokeless tobacco. She reports that she drinks alcohol. She reports that she does not use drugs.   Family History:  The patient's family history includes Alzheimer's disease in her paternal grandfather; Anemia in her sister; Congenital heart disease in her father; Diabetes in her father; Hypertension in her mother; Lung cancer in her maternal grandmother; Ovarian cancer in her paternal grandmother.    ROS:  Please see the history of present illness.   Otherwise, review of systems are positive for none.   All other systems are reviewed and  negative.    PHYSICAL EXAM: VS:  BP 106/64 (BP Location: Left Arm, Patient Position: Sitting, Cuff Size: Large)   Pulse 71   Ht 5\' 4"  (1.626 m)   Wt 178 lb (80.7 kg)   BMI 30.55 kg/m  , BMI Body mass index is 30.55 kg/m. GENERAL:  Well appearing HEENT:  Pupils equal round and reactive, fundi not visualized, oral mucosa unremarkable NECK:  No jugular venous distention, waveform within normal limits, carotid upstroke brisk and symmetric, no bruits LUNGS:  Clear to auscultation bilaterally HEART:  RRR.  PMI not displaced or sustained,S1 and S2 within normal limits, no S3, no S4, no clicks, no rubs, no murmurs ABD:  Flat, positive bowel sounds normal in frequency in pitch, no bruits, no rebound, no guarding, no midline pulsatile mass, no hepatomegaly, no splenomegaly EXT:  2 plus pulses throughout, no edema, no cyanosis no clubbing SKIN:  No rashes no nodules NEURO:  Cranial nerves II through XII grossly intact, motor grossly intact throughout PSYCH:  Cognitively intact, oriented to person place and time   EKG:  EKG is ordered today. The ekg ordered today demonstrates sinus rhythm.  Sinus arrhythmia.  Rate 71 bpm.     Recent Labs: 02/19/2018: BUN 12; Creatinine, Ser 0.93; Hemoglobin 12.5; Platelets 268; Potassium 4.6; Sodium 137    Lipid Panel    Component Value Date/Time   CHOL 202 (H) 12/10/2016 0850   TRIG 74.0 12/10/2016 0850   HDL 59.70 12/10/2016 0850   CHOLHDL 3 12/10/2016 0850   VLDL 14.8 12/10/2016 0850   LDLCALC 127 (H) 12/10/2016 0850      Wt Readings from Last 3 Encounters:  07/19/18 178 lb (80.7 kg)  02/19/18 179 lb 3.2 oz (81.3 kg)  08/30/17 186 lb 6.4 oz (84.6 kg)      ASSESSMENT AND PLAN:  # Atypical chest pain:  Symptoms are quite atypical for ischemia.  I don't think her arm or leg numbness are cardiac.  This may be related to a cervical spine issue.  I am concerned about her exertional chest pain.  Stress test 03/2015 was negative for ischemia.  We  will get a coronary CT-A to get a better evaluation of her coronaries.  # L arm/leg tingling: Likely neurologic.  F/u with PCP.  # Hyperlipidemia:  LDL was 127, total cholesterol 202 in 11/2016.  Repeat lipids and CMP.    Current medicines are reviewed at length with the patient today.  The patient does not have concerns regarding medicines.  The following changes have been made:  no change  Labs/ tests ordered today include:   Orders Placed This Encounter  Procedures  . CT CORONARY MORPH W/CTA COR W/SCORE W/CA W/CM &/OR WO/CM  . CT CORONARY FRACTIONAL FLOW RESERVE DATA PREP  . CT CORONARY FRACTIONAL FLOW RESERVE FLUID ANALYSIS  . Lipid panel  . Comprehensive metabolic panel  .  EKG 12-Lead     Disposition:   FU with Roberto Hlavaty C. Oval Linsey, MD, [facility] in 2 months.      Signed, Escher Harr C. Oval Linsey, MD, [facility]  07/19/2018 5:00 PM    [place]

## 2018-07-19 NOTE — Patient Instructions (Addendum)
Medication Instructions:  METOPROLOL 50 MG 1 HOUR PRIOR TO CARDIAC CT   Labwork: FASTING LP/CMET 1 WEEK PRIOR TO CT   Testing/Procedures: Your physician has requested that you have cardiac CT. Cardiac computed tomography (CT) is a painless test that uses an x-ray machine to take clear, detailed pictures of your heart. For further information please visit [email or web address]. Please follow instruction sheet as given.  Follow-Up: Your physician recommends that you schedule a follow-up appointment in: 2 MONTHS   Any Other Special Instructions Will Be Listed Below (If Applicable).  Please arrive at the [facility] main entrance of [facility] at xx:xx AM (30-45 minutes prior to test start time)  [facility] [place], [place] [address] [phone]  Proceed to the [facility] Radiology Department (First Floor).  Please follow these instructions carefully (unless otherwise directed):  On the Night Before the Test: . Drink plenty of water. . Do not consume any caffeinated/decaffeinated beverages or chocolate 12 hours prior to your test. . Do not take any antihistamines 12 hours prior to your test. . If you take Metformin do not take 24 hours prior to test. . If the patient has contrast allergy: ? Patient will need a prescription for Prednisone and very clear instructions (as follows): 1. Prednisone 50 mg - take 13 hours prior to test 2. Take another Prednisone 50 mg 7 hours prior to test 3. Take another Prednisone 50 mg 1 hour prior to test 4. Take Benadryl 50 mg 1 hour prior to test . Patient must complete all four doses of above prophylactic medications. . Patient will need a ride after test due to Benadryl.  On the Day of the Test: . Drink plenty of water. Do not drink any water within one hour of the test. . Do not eat any food 4 hours prior to the test. . You may take your regular medications prior to the test. . IF NOT ON A BETA BLOCKER -  Take 50 mg of lopressor (metoprolol) one hour before the test. . HOLD Furosemide morning of the test.  After the Test: . Drink plenty of water. . After receiving IV contrast, you may experience a mild flushed feeling. This is normal. . On occasion, you may experience a mild rash up to 24 hours after the test. This is not dangerous. If this occurs, you can take Benadryl 25 mg and increase your fluid intake. . If you experience trouble breathing, this can be serious. If it is severe call 911 IMMEDIATELY. If it is mild, please call our office. . If you take any of these medications: Glipizide/Metformin, Avandament, Glucavance, please do not take 48 hours after completing test.   CA Cardiac CT Angiogram A cardiac CT angiogram is a procedure to look at the heart and the area around the heart. It may be done to help find the cause of chest pains or other symptoms of heart disease. During this procedure, a large X-ray machine, called a CT scanner, takes detailed pictures of the heart and the surrounding area after a dye (contrast material) has been injected into blood vessels in the area. The procedure is also sometimes called a coronary CT angiogram, coronary artery scanning, or CTA. A cardiac CT angiogram allows the health care provider to see how well blood is flowing to and from the heart. The health care provider will be able to see if there are any problems, such as:  Blockage or narrowing of the coronary arteries in  the heart.  Fluid around the heart.  Signs of weakness or disease in the muscles, valves, and tissues of the heart.  Tell a health care provider about:  Any allergies you have. This is especially important if you have had a previous allergic reaction to contrast dye.  All medicines you are taking, including vitamins, herbs, eye drops, creams, and over-the-counter medicines.  Any blood disorders you have.  Any surgeries you have had.  Any medical conditions you  have.  Whether you are pregnant or may be pregnant.  Any anxiety disorders, chronic pain, or other conditions you have that may increase your stress or prevent you from lying still. What are the risks? Generally, this is a safe procedure. However, problems may occur, including:  Bleeding.  Infection.  Allergic reactions to medicines or dyes.  Damage to other structures or organs.  Kidney damage from the dye or contrast that is used.  Increased risk of cancer from radiation exposure. This risk is low. Talk with your health care provider about: ? The risks and benefits of testing. ? How you can receive the lowest dose of radiation.  What happens before the procedure?  Wear comfortable clothing and remove any jewelry, glasses, dentures, and hearing aids.  Follow instructions from your health care provider about eating and drinking. This may include: ? For 12 hours before the test - avoid caffeine. This includes tea, coffee, soda, energy drinks, and diet pills. Drink plenty of water or other fluids that do not have caffeine in them. Being well-hydrated can prevent complications. ? For 4-6 hours before the test - stop eating and drinking. The contrast dye can cause nausea, but this is less likely if your stomach is empty.  Ask your health care provider about changing or stopping your regular medicines. This is especially important if you are taking diabetes medicines, blood thinners, or medicines to treat erectile dysfunction. What happens during the procedure?  Hair on your chest may need to be removed so that small sticky patches called electrodes can be placed on your chest. These will transmit information that helps to monitor your heart during the test.  An IV tube will be inserted into one of your veins.  You might be given a medicine to control your heart rate during the test. This will help to ensure that good images are obtained.  You will be asked to lie on an exam table.  This table will slide in and out of the CT machine during the procedure.  Contrast dye will be injected into the IV tube. You might feel warm, or you may get a metallic taste in your mouth.  You will be given a medicine (nitroglycerin) to relax (dilate) the arteries in your heart.  The table that you are lying on will move into the CT machine tunnel for the scan.  The person running the machine will give you instructions while the scans are being done. You may be asked to: ? Keep your arms above your head. ? Hold your breath. ? Stay very still, even if the table is moving.  When the scanning is complete, you will be moved out of the machine.  The IV tube will be removed. The procedure may vary among health care providers and hospitals. What happens after the procedure?  You might feel warm, or you may get a metallic taste in your mouth from the contrast dye.  You may have a headache from the nitroglycerin.  After the procedure, drink water  or other fluids to wash (flush) the contrast material out of your body.  Contact a health care provider if you have any symptoms of allergy to the contrast. These symptoms include: ? Shortness of breath. ? Rash or hives. ? A racing heartbeat.  Most people can return to their normal activities right after the procedure. Ask your health care provider what activities are safe for you.  It is up to you to get the results of your procedure. Ask your health care provider, or the department that is doing the procedure, when your results will be ready. Summary  A cardiac CT angiogram is a procedure to look at the heart and the area around the heart. It may be done to help find the cause of chest pains or other symptoms of heart disease.  During this procedure, a large X-ray machine, called a CT scanner, takes detailed pictures of the heart and the surrounding area after a dye (contrast material) has been injected into blood vessels in the area.  Ask  your health care provider about changing or stopping your regular medicines before the procedure. This is especially important if you are taking diabetes medicines, blood thinners, or medicines to treat erectile dysfunction.  After the procedure, drink water or other fluids to wash (flush) the contrast material out of your body. This information is not intended to replace advice given to you by your health care provider. Make sure you discuss any questions you have with your health care provider. Document Released: 11/11/2008 Document Revised: 10/18/2016 Document Reviewed: 10/18/2016 Elsevier Interactive Patient Education  2017 [employer].

## 2018-09-04 ENCOUNTER — Encounter (HOSPITAL_COMMUNITY): Payer: Self-pay

## 2018-09-04 ENCOUNTER — Other Ambulatory Visit: Payer: Self-pay | Admitting: Cardiovascular Disease

## 2018-09-04 ENCOUNTER — Ambulatory Visit (HOSPITAL_COMMUNITY): Admission: RE | Admit: 2018-09-04 | Payer: BC Managed Care – PPO | Source: Ambulatory Visit

## 2018-09-04 ENCOUNTER — Ambulatory Visit (HOSPITAL_COMMUNITY)
Admission: RE | Admit: 2018-09-04 | Discharge: 2018-09-04 | Disposition: A | Discharge status: Home / Self Care | Payer: BC Managed Care – PPO | Source: Ambulatory Visit | Attending: Cardiovascular Disease | Admitting: Cardiovascular Disease

## 2018-09-04 DIAGNOSIS — R072 Precordial pain: Secondary | ICD-10-CM

## 2018-09-04 DIAGNOSIS — K824 Cholesterolosis of gallbladder: Secondary | ICD-10-CM

## 2018-09-04 MED ORDER — NITROGLYCERIN 0.4 MG SL SUBL
0.8000 mg | SUBLINGUAL_TABLET | Freq: Once | SUBLINGUAL | Status: AC
  Administered 2018-09-04: 0.8 mg via SUBLINGUAL
  Filled 2018-09-04: qty 25

## 2018-09-04 MED ORDER — METOPROLOL TARTRATE 5 MG/5ML IV SOLN
5.0000 mg | INTRAVENOUS | Status: DC | PRN
  Administered 2018-09-04 (×2): 5 mg via INTRAVENOUS
  Filled 2018-09-04: qty 5

## 2018-09-04 MED ORDER — METOPROLOL TARTRATE 5 MG/5ML IV SOLN
INTRAVENOUS | Status: AC
  Administered 2018-09-04: 5 mg via INTRAVENOUS
  Filled 2018-09-04: qty 10

## 2018-09-04 MED ORDER — NITROGLYCERIN 0.4 MG SL SUBL
SUBLINGUAL_TABLET | SUBLINGUAL | Status: AC
  Administered 2018-09-04: 0.8 mg via SUBLINGUAL
  Filled 2018-09-04: qty 2

## 2018-09-04 MED ORDER — IOPAMIDOL (ISOVUE-370) INJECTION 76%
INTRAVENOUS | Status: AC
  Administered 2018-09-04: 80 mL
  Filled 2018-09-04: qty 100

## 2018-09-04 NOTE — Progress Notes (Signed)
Patient developed nausea after CT test completed. After sitting up and resting patient felt better. Given coke, ice chips, and cookies. Tolerated PO challenge well. Ambulatory steady gait to exit.

## 2018-09-19 ENCOUNTER — Ambulatory Visit: Payer: BC Managed Care – PPO | Admitting: Cardiovascular Disease

## 2018-10-05 ENCOUNTER — Encounter: Payer: Self-pay | Admitting: Cardiovascular Disease

## 2018-10-05 ENCOUNTER — Ambulatory Visit: Payer: BC Managed Care – PPO | Admitting: Cardiovascular Disease

## 2018-10-05 VITALS — BP 115/77 | HR 78 | Ht 64.0 in | Wt 175.8 lb

## 2018-10-05 DIAGNOSIS — R0789 Other chest pain: Secondary | ICD-10-CM | POA: Diagnosis not present

## 2018-10-05 DIAGNOSIS — E785 Hyperlipidemia, unspecified: Secondary | ICD-10-CM | POA: Diagnosis not present

## 2018-10-05 LAB — COMPREHENSIVE METABOLIC PANEL
ALT: 14 IU/L (ref 0–32)
AST: 15 IU/L (ref 0–40)
Albumin/Globulin Ratio: 1.6 (ref 1.2–2.2)
Albumin: 4.4 g/dL (ref 3.5–5.5)
Alkaline Phosphatase: 86 IU/L (ref 39–117)
BUN/Creatinine Ratio: 11 (ref 9–23)
BUN: 11 mg/dL (ref 6–24)
Bilirubin Total: 0.2 mg/dL (ref 0.0–1.2)
CO2: 21 mmol/L (ref 20–29)
Calcium: 11.1 mg/dL — ABNORMAL HIGH (ref 8.7–10.2)
Chloride: 105 mmol/L (ref 96–106)
Creatinine, Ser: 0.96 mg/dL (ref 0.57–1.00)
GFR calc Af Amer: 83 mL/min/{1.73_m2} (ref >59)
GFR calc non Af Amer: 72 mL/min/{1.73_m2} (ref >59)
Globulin, Total: 2.7 g/dL (ref 1.5–4.5)
Glucose: 92 mg/dL (ref 65–99)
Potassium: 5.6 mmol/L — ABNORMAL HIGH (ref 3.5–5.2)
Sodium: 137 mmol/L (ref 134–144)
Total Protein: 7.1 g/dL (ref 6.0–8.5)

## 2018-10-05 LAB — LIPID PANEL
Chol/HDL Ratio: 3.2 ratio (ref 0.0–4.4)
Cholesterol, Total: 217 mg/dL — ABNORMAL HIGH (ref 100–199)
HDL: 68 mg/dL (ref >39)
LDL Calculated: 138 mg/dL — ABNORMAL HIGH (ref 0–99)
Triglycerides: 53 mg/dL (ref 0–149)
VLDL Cholesterol Cal: 11 mg/dL (ref 5–40)

## 2018-10-05 NOTE — Progress Notes (Signed)
Cardiology Office Note   Date:  10/05/2018   ID:  Ana Freeman, DOB [birth year], MRN [id number]  PCP:  Bartholome Bill, MD  Cardiologist:   Skeet Latch, MD   Chief Complaint  Patient presents with  . Follow-up  . Leg Pain    cramping in legs randomly.       History of Present Illness: Ana Freeman is a 47 y.o. female with GERD and anemia here for follow-up.  She was initially seen 07/2018 for the evaluation of chest pain.  She was seen in the ED 02/2018 with chest pain and had been experiencing intermittent chest pain for years.  She first noticed it in 2015.  The episodes occur almost 5 days/week.  She sometimes has chest tightness but also has numbness and tingling that radiates down her left arm and leg.  There is no associated shortness of breath, nausea, or diaphoresis.  She notices it almost every time she exercises, especially in Zumba class.  However it also sometimes occur sporadically. She saw Dr. Einar Gip in 2016 and had an exercise Myoview that was negative for ischemia.  She also had an echocardiogram that she reports was normal though those records are not available at this time.  She was referred for coronary CT-a 08/2018 that revealed a coronary calcium score of 0 and no CAD.  Since her last appointment [name] has been doing well.  She has not had any recurrent chest pain.  She started taking Nexium for 2 weeks and this seems to have helped her symptoms.  She also no longer has left arm symptoms.  Lately when she runs on the treadmill she notices some numbness in her left leg.  She also sometimes has stabbing pain in her feet when lying down.  She has no calf discomfort.  She has not noted any lower extremity edema, orthopnea, or PND.  She denies shortness of breath, lightheadedness, or dizziness.  She continues to exercise regularly.    Past Medical History:  Diagnosis Date  . Anemia   . GERD (gastroesophageal reflux disease)   . Hyperlipidemia  07/19/2018  . Numbness of lower extremity   . 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 DILATION AND EVACUATION N/A 02/12/2016   Procedure: DILATATION AND EVACUATION WITH SUCTION ;  Surgeon: Eldred Manges, MD;  Location: [facility];  Service: Gynecology;  Laterality: N/A;  . HYSTEROSCOPY WITH RESECTOSCOPE N/A 02/12/2016   Procedure: HYSTEROSCOPIC MYOMECTOMY WITH RESECTOSCOPE;  Surgeon: Eldred Manges, MD;  Location: [facility];  Service: Gynecology;  Laterality: N/A;  . MASS EXCISION Right 08/30/2017   Procedure: EXCISION RIGHT UPPER ARM MASS;  Surgeon: Coralie Keens, MD;  Location: [facility];  Service: General;  Laterality: Right;  . MYOMECTOMY N/A 02/12/2016   Procedure: ABDOMINAL MYOMECTOMY;  Surgeon: Eldred Manges, MD;  Location: [facility];  Service: Gynecology;  Laterality: N/A;  . UPPER GI ENDOSCOPY    . WISDOM TOOTH EXTRACTION       Current Outpatient Medications  Medication Sig Dispense Refill  . fluocinonide ointment (LIDEX) 7.00 % Apply 1 application topically 4 (four) times a week.    . Iron-FA-B Cmp-C-Biot-Probiotic (FUSION PLUS) CAPS Take 1 capsule by mouth daily.  3  . metoprolol tartrate (LOPRESSOR) 25 MG tablet TAKE 1 TABLET BY MOUTH 1 HOUR PRIOR TO PROCEDURE 1 tablet 0  . valACYclovir (VALTREX) 1000 MG tablet Take 2 g by mouth  every 12 (twelve) hours as needed for other. Cold sores     No current facility-administered medications for this visit.     Allergies:   Patient has no known allergies.    Social History:  The patient  reports that she has never smoked. She has never used smokeless tobacco. She reports that she drinks alcohol. She reports that she does not use drugs.   Family History:  The patient's family history includes Alzheimer's disease in her paternal grandfather; Anemia in her sister; Congenital heart disease in her father; Diabetes in her father; Hypertension in her mother; Lung cancer in her  maternal grandmother; Ovarian cancer in her paternal grandmother.    ROS:  Please see the history of present illness.   Otherwise, review of systems are positive for none.   All other systems are reviewed and negative.    PHYSICAL EXAM: VS:  BP 115/77   Pulse 78   Ht 5\' 4"  (1.626 m)   Wt 175 lb 12.8 oz (79.7 kg)   BMI 30.18 kg/m  , BMI Body mass index is 30.18 kg/m. GENERAL:  Well appearing HEENT: Pupils equal round and reactive, fundi not visualized, oral mucosa unremarkable NECK:  No jugular venous distention, waveform within normal limits, carotid upstroke brisk and symmetric, no bruits LUNGS:  Clear to auscultation bilaterally HEART:  RRR.  PMI not displaced or sustained,S1 and S2 within normal limits, no S3, no S4, no clicks, no rubs, no murmurs ABD:  Flat, positive bowel sounds normal in frequency in pitch, no bruits, no rebound, no guarding, no midline pulsatile mass, no hepatomegaly, no splenomegaly EXT:  2 plus pulses throughout, no edema, no cyanosis no clubbing SKIN:  No rashes no nodules NEURO:  Cranial nerves II through XII grossly intact, motor grossly intact throughout PSYCH:  Cognitively intact, oriented to person place and time   EKG:  EKG is not ordered today. The ekg ordered 07/19/18 demonstrates sinus rhythm.  Sinus arrhythmia.  Rate 71 bpm.    Coronary CT-A 09/04/18: IMPRESSION: 1. Normal right dominant coronary arteries  2.  Calcium score 0  3.  Normal aortic root 2.9 cm   Recent Labs: 02/19/2018: BUN 12; Creatinine, Ser 0.93; Hemoglobin 12.5; Platelets 268; Potassium 4.6; Sodium 137    Lipid Panel    Component Value Date/Time   CHOL 202 (H) 12/10/2016 0850   TRIG 74.0 12/10/2016 0850   HDL 59.70 12/10/2016 0850   CHOLHDL 3 12/10/2016 0850   VLDL 14.8 12/10/2016 0850   LDLCALC 127 (H) 12/10/2016 0850      Wt Readings from Last 3 Encounters:  10/05/18 175 lb 12.8 oz (79.7 kg)  07/19/18 178 lb (80.7 kg)  02/19/18 179 lb 3.2 oz (81.3 kg)       ASSESSMENT AND PLAN:  # Atypical chest pain:  No CAD seen on CT-A 08/2018.  Coronary calcium score was 0.  Her chest pain is not coming from CAD and likely is due to GERD.  It has improved with a PPI.  # L arm/leg tingling: Likely neurologic.  F/u with PCP.  # Hyperlipidemia:  LDL was 127, total cholesterol 202 in 11/2016.  Repeat lipids and CMP today.   Current medicines are reviewed at length with the patient today.  The patient does not have concerns regarding medicines.  The following changes have been made:  no change  Labs/ tests ordered today include:   Orders Placed This Encounter  Procedures  . Lipid panel  . Comprehensive metabolic panel  Disposition:   FU with Shifra Swartzentruber C. Oval Linsey, MD, [facility] as needed    Signed, Breckenridge Oval Linsey, MD, [facility]  10/05/2018 8:45 AM    [place]

## 2018-10-05 NOTE — Patient Instructions (Signed)
Medication Instructions:  Your physician recommends that you continue on your current medications as directed. Please refer to the Current Medication list given to you today.  If you need a refill on your cardiac medications before your next appointment, please call your pharmacy.   Lab work: LP/CMET TODAY  If you have labs (blood work) drawn today and your tests are completely normal, you will receive your results only by: Marland Kitchen MyChart Message (if you have MyChart) OR . A paper copy in the mail If you have any lab test that is abnormal or we need to change your treatment, we will call you to review the results.  Testing/Procedures: NONE  Follow-Up: AS NEEDED

## 2018-10-05 NOTE — Assessment & Plan Note (Signed)
Non-cardiac.  Coronary CT-A 08/2018 was negative for CAD.  Coronary calcium score 0.

## 2018-10-09 ENCOUNTER — Telehealth: Payer: Self-pay | Admitting: *Deleted

## 2018-10-09 DIAGNOSIS — E785 Hyperlipidemia, unspecified: Secondary | ICD-10-CM

## 2018-10-09 DIAGNOSIS — E875 Hyperkalemia: Secondary | ICD-10-CM

## 2018-10-09 NOTE — Telephone Encounter (Signed)
Left message to call back  

## 2018-10-09 NOTE — Telephone Encounter (Signed)
-----   Message from Skeet Latch, MD sent at 10/08/2018 10:22 PM EDT ----- Cholesterol levels are very elevated.  Kidney function and liver function are normal.  Calcium is chronically elevated.  Recommend that she limit fried foods, fatty food and cheese.  Exercise at least 150 minutes per week.  Repeat lipids/CMP in 3 months and if still elevated will need to consider medications.

## 2018-10-27 NOTE — Telephone Encounter (Signed)
Unable to reach patient via phone. Labs and comments have been viewed in mychart. Mailed copy of labs with Dr Blenda Mounts comments highlighted, watch high potassium foods, potassium diet information, and lab orders for recheck.

## 2019-07-05 ENCOUNTER — Inpatient Hospital Stay (HOSPITAL_COMMUNITY): Payer: BC Managed Care – PPO

## 2019-07-05 ENCOUNTER — Inpatient Hospital Stay (HOSPITAL_COMMUNITY): Payer: BC Managed Care – PPO | Admitting: Certified Registered"

## 2019-07-05 ENCOUNTER — Inpatient Hospital Stay (HOSPITAL_COMMUNITY)
Admission: EM | Admit: 2019-07-05 | Discharge: 2019-07-12 | DRG: 493 | Disposition: A | Discharge status: Home Health | Payer: BC Managed Care – PPO | Attending: General Surgery | Admitting: General Surgery

## 2019-07-05 ENCOUNTER — Emergency Department (HOSPITAL_COMMUNITY): Payer: BC Managed Care – PPO

## 2019-07-05 ENCOUNTER — Encounter (HOSPITAL_COMMUNITY): Admission: EM | Disposition: A | Discharge status: Home Health | Payer: Self-pay | Source: Home / Self Care

## 2019-07-05 ENCOUNTER — Other Ambulatory Visit: Payer: Self-pay

## 2019-07-05 DIAGNOSIS — R52 Pain, unspecified: Secondary | ICD-10-CM

## 2019-07-05 DIAGNOSIS — D62 Acute posthemorrhagic anemia: Secondary | ICD-10-CM | POA: Diagnosis not present

## 2019-07-05 DIAGNOSIS — F419 Anxiety disorder, unspecified: Secondary | ICD-10-CM | POA: Diagnosis present

## 2019-07-05 DIAGNOSIS — S32001A Stable burst fracture of unspecified lumbar vertebra, initial encounter for closed fracture: Secondary | ICD-10-CM

## 2019-07-05 DIAGNOSIS — X000XXA Exposure to flames in uncontrolled fire in building or structure, initial encounter: Secondary | ICD-10-CM | POA: Diagnosis present

## 2019-07-05 DIAGNOSIS — T148XXA Other injury of unspecified body region, initial encounter: Secondary | ICD-10-CM

## 2019-07-05 DIAGNOSIS — S92141A Displaced dome fracture of right talus, initial encounter for closed fracture: Secondary | ICD-10-CM | POA: Diagnosis present

## 2019-07-05 DIAGNOSIS — S82851A Displaced trimalleolar fracture of right lower leg, initial encounter for closed fracture: Secondary | ICD-10-CM | POA: Diagnosis present

## 2019-07-05 DIAGNOSIS — F430 Acute stress reaction: Secondary | ICD-10-CM | POA: Diagnosis present

## 2019-07-05 DIAGNOSIS — Z09 Encounter for follow-up examination after completed treatment for conditions other than malignant neoplasm: Secondary | ICD-10-CM

## 2019-07-05 DIAGNOSIS — S82899A Other fracture of unspecified lower leg, initial encounter for closed fracture: Secondary | ICD-10-CM

## 2019-07-05 DIAGNOSIS — M4856XA Collapsed vertebra, not elsewhere classified, lumbar region, initial encounter for fracture: Secondary | ICD-10-CM | POA: Diagnosis present

## 2019-07-05 DIAGNOSIS — S82891A Other fracture of right lower leg, initial encounter for closed fracture: Secondary | ICD-10-CM

## 2019-07-05 DIAGNOSIS — Z20828 Contact with and (suspected) exposure to other viral communicable diseases: Secondary | ICD-10-CM | POA: Diagnosis present

## 2019-07-05 DIAGNOSIS — S82899B Other fracture of unspecified lower leg, initial encounter for open fracture type I or II: Secondary | ICD-10-CM | POA: Diagnosis present

## 2019-07-05 DIAGNOSIS — S32010A Wedge compression fracture of first lumbar vertebra, initial encounter for closed fracture: Secondary | ICD-10-CM

## 2019-07-05 DIAGNOSIS — S82872C Displaced pilon fracture of left tibia, initial encounter for open fracture type IIIA, IIIB, or IIIC: Secondary | ICD-10-CM | POA: Diagnosis not present

## 2019-07-05 DIAGNOSIS — S92131A Displaced fracture of posterior process of right talus, initial encounter for closed fracture: Secondary | ICD-10-CM | POA: Diagnosis present

## 2019-07-05 DIAGNOSIS — W1789XA Other fall from one level to another, initial encounter: Secondary | ICD-10-CM | POA: Diagnosis present

## 2019-07-05 DIAGNOSIS — S92134A Nondisplaced fracture of posterior process of right talus, initial encounter for closed fracture: Secondary | ICD-10-CM

## 2019-07-05 HISTORY — PX: I & D EXTREMITY: SHX5045

## 2019-07-05 HISTORY — PX: EXTERNAL FIXATION LEG: SHX1549

## 2019-07-05 HISTORY — PX: ANKLE CLOSED REDUCTION: SHX880

## 2019-07-05 LAB — COMPREHENSIVE METABOLIC PANEL
ALT: 17 U/L (ref 0–44)
AST: 30 U/L (ref 15–41)
Albumin: 3.5 g/dL (ref 3.5–5.0)
Alkaline Phosphatase: 75 U/L (ref 38–126)
Anion gap: 10 (ref 5–15)
BUN: 10 mg/dL (ref 6–20)
CO2: 17 mmol/L — ABNORMAL LOW (ref 22–32)
Calcium: 10.4 mg/dL — ABNORMAL HIGH (ref 8.9–10.3)
Chloride: 110 mmol/L (ref 98–111)
Creatinine, Ser: 1.07 mg/dL — ABNORMAL HIGH (ref 0.44–1.00)
GFR calc Af Amer: >60 mL/min (ref >60)
GFR calc non Af Amer: >60 mL/min (ref >60)
Glucose, Bld: 137 mg/dL — ABNORMAL HIGH (ref 70–99)
Potassium: 3.6 mmol/L (ref 3.5–5.1)
Sodium: 137 mmol/L (ref 135–145)
Total Bilirubin: 0.3 mg/dL (ref 0.3–1.2)
Total Protein: 6.8 g/dL (ref 6.5–8.1)

## 2019-07-05 LAB — I-STAT CHEM 8, ED
BUN: 9 mg/dL (ref 6–20)
Calcium, Ion: 1.35 mmol/L (ref 1.15–1.40)
Chloride: 110 mmol/L (ref 98–111)
Creatinine, Ser: 0.9 mg/dL (ref 0.44–1.00)
Glucose, Bld: 138 mg/dL — ABNORMAL HIGH (ref 70–99)
HCT: 33 % — ABNORMAL LOW (ref 36.0–46.0)
Hemoglobin: 11.2 g/dL — ABNORMAL LOW (ref 12.0–15.0)
Potassium: 3.7 mmol/L (ref 3.5–5.1)
Sodium: 139 mmol/L (ref 135–145)
TCO2: 20 mmol/L — ABNORMAL LOW (ref 22–32)

## 2019-07-05 LAB — SAMPLE TO BLOOD BANK

## 2019-07-05 LAB — CBC
HCT: 32.4 % — ABNORMAL LOW (ref 36.0–46.0)
Hemoglobin: 9.3 g/dL — ABNORMAL LOW (ref 12.0–15.0)
MCH: 21 pg — ABNORMAL LOW (ref 26.0–34.0)
MCHC: 28.7 g/dL — ABNORMAL LOW (ref 30.0–36.0)
MCV: 73.3 fL — ABNORMAL LOW (ref 80.0–100.0)
Platelets: 360 10*3/uL (ref 150–400)
RBC: 4.42 MIL/uL (ref 3.87–5.11)
RDW: 19.4 % — ABNORMAL HIGH (ref 11.5–15.5)
WBC: 7.8 10*3/uL (ref 4.0–10.5)
nRBC: 0 % (ref 0.0–0.2)

## 2019-07-05 LAB — ETHANOL: Alcohol, Ethyl (B): <10 mg/dL (ref <10)

## 2019-07-05 LAB — I-STAT BETA HCG BLOOD, ED (MC, WL, AP ONLY): I-stat hCG, quantitative: <5 m[IU]/mL (ref <5)

## 2019-07-05 LAB — PROTIME-INR
INR: 1 (ref 0.8–1.2)
Prothrombin Time: 13.2 seconds (ref 11.4–15.2)

## 2019-07-05 LAB — SARS CORONAVIRUS 2 BY RT PCR (HOSPITAL ORDER, PERFORMED IN ~~LOC~~ HOSPITAL LAB): SARS Coronavirus 2: NEGATIVE

## 2019-07-05 LAB — LACTIC ACID, PLASMA: Lactic Acid, Venous: 2.9 mmol/L (ref 0.5–1.9)

## 2019-07-05 LAB — CDS SEROLOGY

## 2019-07-05 SURGERY — EXTERNAL FIXATION, LOWER EXTREMITY
Anesthesia: General | Site: Ankle | Laterality: Right

## 2019-07-05 MED ORDER — HYDROMORPHONE HCL 1 MG/ML IJ SOLN
1.0000 mg | Freq: Once | INTRAMUSCULAR | Status: AC
  Administered 2019-07-05: 1 mg via INTRAVENOUS
  Filled 2019-07-05: qty 1

## 2019-07-05 MED ORDER — ONDANSETRON HCL 4 MG/2ML IJ SOLN: 4.0000 mg | Freq: Four times a day (QID) | INTRAMUSCULAR | Status: DC | PRN

## 2019-07-05 MED ORDER — HYDROMORPHONE HCL 1 MG/ML IJ SOLN
1.0000 mg | INTRAMUSCULAR | Status: DC | PRN
  Administered 2019-07-05 – 2019-07-09 (×5): 1 mg via INTRAVENOUS
  Filled 2019-07-05 (×5): qty 1

## 2019-07-05 MED ORDER — SUGAMMADEX SODIUM 200 MG/2ML IV SOLN
INTRAVENOUS | Status: DC | PRN
  Administered 2019-07-05: 200 mg via INTRAVENOUS

## 2019-07-05 MED ORDER — SODIUM CHLORIDE 0.9 % IR SOLN
Status: DC | PRN
  Administered 2019-07-05: 6000 mL

## 2019-07-05 MED ORDER — LIDOCAINE 2% (20 MG/ML) 5 ML SYRINGE
INTRAMUSCULAR | Status: AC
  Filled 2019-07-05: qty 20

## 2019-07-05 MED ORDER — ONDANSETRON HCL 4 MG/2ML IJ SOLN
INTRAMUSCULAR | Status: DC | PRN
  Administered 2019-07-05: 4 mg via INTRAVENOUS

## 2019-07-05 MED ORDER — TOBRAMYCIN SULFATE 1.2 G IJ SOLR
INTRAMUSCULAR | Status: DC | PRN
  Administered 2019-07-05: 1.2 g via TOPICAL

## 2019-07-05 MED ORDER — HYDRALAZINE HCL 20 MG/ML IJ SOLN: 10.0000 mg | INTRAMUSCULAR | Status: DC | PRN

## 2019-07-05 MED ORDER — PHENYLEPHRINE 40 MCG/ML (10ML) SYRINGE FOR IV PUSH (FOR BLOOD PRESSURE SUPPORT)
PREFILLED_SYRINGE | INTRAVENOUS | Status: AC
  Filled 2019-07-05: qty 40

## 2019-07-05 MED ORDER — CEFAZOLIN SODIUM-DEXTROSE 1-4 GM/50ML-% IV SOLN
1.0000 g | Freq: Three times a day (TID) | INTRAVENOUS | Status: DC
  Administered 2019-07-06 (×2): 1 g via INTRAVENOUS
  Filled 2019-07-05 (×3): qty 50

## 2019-07-05 MED ORDER — PROPOFOL 10 MG/ML IV BOLUS
INTRAVENOUS | Status: DC | PRN
  Administered 2019-07-05: 190 mg via INTRAVENOUS

## 2019-07-05 MED ORDER — VANCOMYCIN HCL 1000 MG IV SOLR
INTRAVENOUS | Status: DC | PRN
  Administered 2019-07-05: 1 g via TOPICAL

## 2019-07-05 MED ORDER — OXYCODONE HCL 5 MG/5ML PO SOLN: 5.0000 mg | Freq: Once | ORAL | Status: DC | PRN

## 2019-07-05 MED ORDER — PIPERACILLIN-TAZOBACTAM 3.375 G IVPB 30 MIN
3.3750 g | Freq: Four times a day (QID) | INTRAVENOUS | Status: DC
  Administered 2019-07-05 – 2019-07-07 (×6): 3.375 g via INTRAVENOUS
  Filled 2019-07-05 (×8): qty 50

## 2019-07-05 MED ORDER — METHOCARBAMOL 1000 MG/10ML IJ SOLN
1000.0000 mg | Freq: Once | INTRAVENOUS | Status: AC
  Administered 2019-07-05: 1000 mg via INTRAVENOUS
  Filled 2019-07-05: qty 10

## 2019-07-05 MED ORDER — DEXAMETHASONE SODIUM PHOSPHATE 10 MG/ML IJ SOLN
INTRAMUSCULAR | Status: AC
  Filled 2019-07-05: qty 3

## 2019-07-05 MED ORDER — PANTOPRAZOLE SODIUM 40 MG PO TBEC
40.0000 mg | DELAYED_RELEASE_TABLET | Freq: Every day | ORAL | Status: DC
  Administered 2019-07-06 – 2019-07-12 (×6): 40 mg via ORAL
  Filled 2019-07-05 (×6): qty 1

## 2019-07-05 MED ORDER — FENTANYL CITRATE (PF) 100 MCG/2ML IJ SOLN
INTRAMUSCULAR | Status: AC
  Filled 2019-07-05: qty 2

## 2019-07-05 MED ORDER — PANTOPRAZOLE SODIUM 40 MG IV SOLR
40.0000 mg | Freq: Every day | INTRAVENOUS | Status: DC
  Filled 2019-07-05: qty 40

## 2019-07-05 MED ORDER — METHOCARBAMOL 1000 MG/10ML IJ SOLN: 1000.0000 mg | Freq: Three times a day (TID) | INTRAVENOUS | Status: DC | PRN

## 2019-07-05 MED ORDER — ONDANSETRON HCL 4 MG/2ML IJ SOLN
INTRAMUSCULAR | Status: AC
  Filled 2019-07-05: qty 2

## 2019-07-05 MED ORDER — ROCURONIUM BROMIDE 10 MG/ML (PF) SYRINGE
PREFILLED_SYRINGE | INTRAVENOUS | Status: AC
  Filled 2019-07-05: qty 10

## 2019-07-05 MED ORDER — ENOXAPARIN SODIUM 40 MG/0.4ML ~~LOC~~ SOLN
40.0000 mg | SUBCUTANEOUS | Status: DC
  Administered 2019-07-06 – 2019-07-12 (×6): 40 mg via SUBCUTANEOUS
  Filled 2019-07-05 (×7): qty 0.4

## 2019-07-05 MED ORDER — ONDANSETRON 4 MG PO TBDP: 4.0000 mg | ORAL_TABLET | Freq: Four times a day (QID) | ORAL | Status: DC | PRN

## 2019-07-05 MED ORDER — SUCCINYLCHOLINE CHLORIDE 200 MG/10ML IV SOSY
PREFILLED_SYRINGE | INTRAVENOUS | Status: AC
  Filled 2019-07-05: qty 10

## 2019-07-05 MED ORDER — SUCCINYLCHOLINE 20MG/ML (10ML) SYRINGE FOR MEDFUSION PUMP - OPTIME
INTRAMUSCULAR | Status: DC | PRN
  Administered 2019-07-05: 120 mg via INTRAVENOUS

## 2019-07-05 MED ORDER — HYDROMORPHONE HCL 1 MG/ML IJ SOLN: 1.0000 mg | Freq: Once | INTRAMUSCULAR | Status: AC

## 2019-07-05 MED ORDER — FENTANYL CITRATE (PF) 100 MCG/2ML IJ SOLN
25.0000 ug | INTRAMUSCULAR | Status: DC | PRN
  Administered 2019-07-05 (×2): 50 ug via INTRAVENOUS

## 2019-07-05 MED ORDER — OXYCODONE HCL 5 MG PO TABS: 10.0000 mg | ORAL_TABLET | ORAL | Status: DC | PRN

## 2019-07-05 MED ORDER — FENTANYL CITRATE (PF) 250 MCG/5ML IJ SOLN
INTRAMUSCULAR | Status: AC
  Filled 2019-07-05: qty 5

## 2019-07-05 MED ORDER — VANCOMYCIN HCL 1000 MG IV SOLR
INTRAVENOUS | Status: AC
  Filled 2019-07-05: qty 1000

## 2019-07-05 MED ORDER — ACETAMINOPHEN 325 MG PO TABS: 650.0000 mg | ORAL_TABLET | ORAL | Status: DC | PRN

## 2019-07-05 MED ORDER — TETANUS-DIPHTH-ACELL PERTUSSIS 5-2.5-18.5 LF-MCG/0.5 IM SUSP
0.5000 mL | Freq: Once | INTRAMUSCULAR | Status: AC
  Administered 2019-07-05: 0.5 mL via INTRAMUSCULAR
  Filled 2019-07-05: qty 0.5

## 2019-07-05 MED ORDER — MIDAZOLAM HCL 2 MG/2ML IJ SOLN
INTRAMUSCULAR | Status: AC
  Filled 2019-07-05: qty 2

## 2019-07-05 MED ORDER — KCL IN DEXTROSE-NACL 20-5-0.45 MEQ/L-%-% IV SOLN
INTRAVENOUS | Status: DC
  Administered 2019-07-06 – 2019-07-09 (×5): via INTRAVENOUS
  Filled 2019-07-05 (×5): qty 1000

## 2019-07-05 MED ORDER — LACTATED RINGERS IV SOLN
INTRAVENOUS | Status: DC | PRN
  Administered 2019-07-05 (×2): via INTRAVENOUS

## 2019-07-05 MED ORDER — DEXAMETHASONE SODIUM PHOSPHATE 10 MG/ML IJ SOLN
INTRAMUSCULAR | Status: DC | PRN
  Administered 2019-07-05: 10 mg via INTRAVENOUS

## 2019-07-05 MED ORDER — MIDAZOLAM HCL 2 MG/2ML IJ SOLN
INTRAMUSCULAR | Status: DC | PRN
  Administered 2019-07-05: 2 mg via INTRAVENOUS

## 2019-07-05 MED ORDER — TOBRAMYCIN SULFATE 1.2 G IJ SOLR
INTRAMUSCULAR | Status: AC
  Filled 2019-07-05: qty 1.2

## 2019-07-05 MED ORDER — LIDOCAINE HCL (CARDIAC) PF 100 MG/5ML IV SOSY
PREFILLED_SYRINGE | INTRAVENOUS | Status: DC | PRN
  Administered 2019-07-05: 50 mg via INTRATRACHEAL

## 2019-07-05 MED ORDER — FENTANYL CITRATE (PF) 250 MCG/5ML IJ SOLN
INTRAMUSCULAR | Status: DC | PRN
  Administered 2019-07-05: 50 ug via INTRAVENOUS
  Administered 2019-07-05: 100 ug via INTRAVENOUS

## 2019-07-05 MED ORDER — OXYCODONE HCL 5 MG PO TABS: 5.0000 mg | ORAL_TABLET | ORAL | Status: DC | PRN

## 2019-07-05 MED ORDER — OXYCODONE HCL 5 MG PO TABS: 5.0000 mg | ORAL_TABLET | Freq: Once | ORAL | Status: DC | PRN

## 2019-07-05 MED ORDER — CHLORHEXIDINE GLUCONATE 4 % EX LIQD: 60.0000 mL | Freq: Once | CUTANEOUS | Status: AC

## 2019-07-05 MED ORDER — MORPHINE SULFATE (PF) 4 MG/ML IV SOLN
4.0000 mg | Freq: Once | INTRAVENOUS | Status: DC
  Filled 2019-07-05: qty 1

## 2019-07-05 MED ORDER — CEFAZOLIN SODIUM-DEXTROSE 2-4 GM/100ML-% IV SOLN: 2.0000 g | INTRAVENOUS | Status: DC

## 2019-07-05 MED ORDER — EPHEDRINE 5 MG/ML INJ
INTRAVENOUS | Status: AC
  Filled 2019-07-05: qty 40

## 2019-07-05 MED ORDER — SODIUM CHLORIDE 0.9 % IR SOLN
Status: DC | PRN
  Administered 2019-07-05: 1000 mL

## 2019-07-05 MED ORDER — MORPHINE SULFATE (PF) 4 MG/ML IV SOLN
4.0000 mg | Freq: Once | INTRAVENOUS | Status: AC
  Administered 2019-07-05: 4 mg via INTRAVENOUS
  Filled 2019-07-05: qty 1

## 2019-07-05 MED ORDER — ROCURONIUM 10MG/ML (10ML) SYRINGE FOR MEDFUSION PUMP - OPTIME
INTRAVENOUS | Status: DC | PRN
  Administered 2019-07-05: 30 mg via INTRAVENOUS

## 2019-07-05 MED ORDER — CEFAZOLIN SODIUM 1 G IJ SOLR
INTRAMUSCULAR | Status: AC
  Filled 2019-07-05: qty 20

## 2019-07-05 MED ORDER — IOHEXOL 300 MG/ML  SOLN
100.0000 mL | Freq: Once | INTRAMUSCULAR | Status: AC | PRN
  Administered 2019-07-05: 100 mL via INTRAVENOUS

## 2019-07-05 MED ORDER — CEFAZOLIN SODIUM-DEXTROSE 2-4 GM/100ML-% IV SOLN
2.0000 g | Freq: Once | INTRAVENOUS | Status: AC
  Administered 2019-07-05: 2 g via INTRAVENOUS
  Filled 2019-07-05: qty 100

## 2019-07-05 MED ORDER — CEFAZOLIN SODIUM-DEXTROSE 2-3 GM-%(50ML) IV SOLR
INTRAVENOUS | Status: DC | PRN
  Administered 2019-07-05: 2 g via INTRAVENOUS

## 2019-07-05 MED ORDER — PROPOFOL 10 MG/ML IV BOLUS
INTRAVENOUS | Status: AC
  Filled 2019-07-05: qty 40

## 2019-07-05 SURGICAL SUPPLY — 69 items
BANDAGE ELASTIC 4 VELCRO ST LF (GAUZE/BANDAGES/DRESSINGS) ×2 IMPLANT
BANDAGE ELASTIC 6 VELCRO ST LF (GAUZE/BANDAGES/DRESSINGS) ×2 IMPLANT
BANDAGE ESMARK 6X9 LF (GAUZE/BANDAGES/DRESSINGS) ×2 IMPLANT
BAR EXFX 500X11 NS LF (EXFIX) ×4
BAR GLASS FIBER EXFX 11X500 (EXFIX) ×4 IMPLANT
BNDG CMPR 9X6 STRL LF SNTH (GAUZE/BANDAGES/DRESSINGS)
BNDG COHESIVE 6X5 TAN STRL LF (GAUZE/BANDAGES/DRESSINGS) ×2 IMPLANT
BNDG ELASTIC 4X5.8 VLCR STR LF (GAUZE/BANDAGES/DRESSINGS) ×6 IMPLANT
BNDG ELASTIC 6X5.8 VLCR STR LF (GAUZE/BANDAGES/DRESSINGS) ×6 IMPLANT
BNDG ESMARK 6X9 LF (GAUZE/BANDAGES/DRESSINGS)
BNDG GAUZE ELAST 4 BULKY (GAUZE/BANDAGES/DRESSINGS) ×6 IMPLANT
BRUSH SCRUB EZ PLAIN DRY (MISCELLANEOUS) ×8 IMPLANT
CANISTER WOUNDNEG PRESSURE 500 (CANNISTER) ×2 IMPLANT
CLAMP PIN 2 BAR 45MM EXFIX (EXFIX) ×2 IMPLANT
CLAMP PIN 45MM 1 BAR (EXFIX) ×4 IMPLANT
CONT SPEC 4OZ CLIKSEAL STRL BL (MISCELLANEOUS) IMPLANT
COVER SURGICAL LIGHT HANDLE (MISCELLANEOUS) ×8 IMPLANT
COVER WAND RF STERILE (DRAPES) ×2 IMPLANT
CUFF TOURN SGL LL 12 NO SLV (MISCELLANEOUS) IMPLANT
CUFF TOURN SGL QUICK 34 (TOURNIQUET CUFF)
CUFF TRNQT CYL 34X4.125X (TOURNIQUET CUFF) IMPLANT
DRAPE C-ARM 42X72 X-RAY (DRAPES) ×4 IMPLANT
DRAPE C-ARMOR (DRAPES) ×4 IMPLANT
DRAPE U-SHAPE 47X51 STRL (DRAPES) ×4 IMPLANT
DRESSING PREVENA PLUS CUSTOM (GAUZE/BANDAGES/DRESSINGS) IMPLANT
DRSG PAD ABDOMINAL 8X10 ST (GAUZE/BANDAGES/DRESSINGS) ×2 IMPLANT
DRSG PREVENA PLUS CUSTOM (GAUZE/BANDAGES/DRESSINGS) ×4
DURAPREP 26ML APPLICATOR (WOUND CARE) ×2 IMPLANT
ELECT REM PT RETURN 9FT ADLT (ELECTROSURGICAL) ×4
ELECTRODE REM PT RTRN 9FT ADLT (ELECTROSURGICAL) ×2 IMPLANT
GAUZE SPONGE 4X4 12PLY STRL (GAUZE/BANDAGES/DRESSINGS) ×4 IMPLANT
GAUZE XEROFORM 1X8 LF (GAUZE/BANDAGES/DRESSINGS) ×2 IMPLANT
GAUZE XEROFORM 5X9 LF (GAUZE/BANDAGES/DRESSINGS) ×4 IMPLANT
GLOVE BIO SURGEON STRL SZ7.5 (GLOVE) ×4 IMPLANT
GLOVE BIO SURGEON STRL SZ8 (GLOVE) ×4 IMPLANT
GLOVE BIOGEL PI IND STRL 8 (GLOVE) ×4 IMPLANT
GLOVE BIOGEL PI INDICATOR 8 (GLOVE) ×4
GLOVE ECLIPSE 8.0 STRL XLNG CF (GLOVE) ×8 IMPLANT
GOWN STRL REUS W/ TWL LRG LVL3 (GOWN DISPOSABLE) ×4 IMPLANT
GOWN STRL REUS W/ TWL XL LVL3 (GOWN DISPOSABLE) ×4 IMPLANT
GOWN STRL REUS W/TWL LRG LVL3 (GOWN DISPOSABLE) ×8
GOWN STRL REUS W/TWL XL LVL3 (GOWN DISPOSABLE) ×8
HANDPIECE INTERPULSE COAX TIP (DISPOSABLE) ×4
KIT BASIN OR (CUSTOM PROCEDURE TRAY) ×4 IMPLANT
KIT PREVENA INCISION MGT 13 (CANNISTER) ×2 IMPLANT
KIT TURNOVER KIT B (KITS) ×4 IMPLANT
MANIFOLD NEPTUNE II (INSTRUMENTS) ×4 IMPLANT
NS IRRIG 1000ML POUR BTL (IV SOLUTION) ×4 IMPLANT
PACK ORTHO EXTREMITY (CUSTOM PROCEDURE TRAY) ×4 IMPLANT
PAD ARMBOARD 7.5X6 YLW CONV (MISCELLANEOUS) ×8 IMPLANT
PAD CAST 4YDX4 CTTN HI CHSV (CAST SUPPLIES) ×2 IMPLANT
PADDING CAST COTTON 4X4 STRL (CAST SUPPLIES) ×8
PADDING CAST COTTON 6X4 STRL (CAST SUPPLIES) ×8 IMPLANT
PIN HALF YELLOW 5X160X35 (EXFIX) ×4 IMPLANT
PIN TRANSFIXING 5.0 (EXFIX) ×4 IMPLANT
POST 30 DEG ANGLED 11MM (EXFIX) IMPLANT
SET HNDPC FAN SPRY TIP SCT (DISPOSABLE) IMPLANT
SPLINT PLASTER CAST XFAST 5X30 (CAST SUPPLIES) IMPLANT
SPLINT PLASTER XFAST SET 5X30 (CAST SUPPLIES) ×2
SPONGE LAP 18X18 RF (DISPOSABLE) IMPLANT
STOCKINETTE IMPERVIOUS LG (DRAPES) IMPLANT
SUT ETHILON 2 0 FS 18 (SUTURE) ×6 IMPLANT
SWAB CULTURE ESWAB REG 1ML (MISCELLANEOUS) IMPLANT
TOWEL GREEN STERILE (TOWEL DISPOSABLE) ×8 IMPLANT
TOWEL GREEN STERILE FF (TOWEL DISPOSABLE) ×4 IMPLANT
TUBE CONNECTING 12'X1/4 (SUCTIONS) ×1
TUBE CONNECTING 12X1/4 (SUCTIONS) ×3 IMPLANT
UNDERPAD 30X30 (UNDERPADS AND DIAPERS) ×4 IMPLANT
YANKAUER SUCT BULB TIP NO VENT (SUCTIONS) ×4 IMPLANT

## 2019-07-05 NOTE — ED Notes (Signed)
Ronnie Derby: [phone]
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
## 2019-07-05 NOTE — Transfer of Care (Signed)
Immediate Anesthesia Transfer of Care Note  Patient: Ana Freeman  Procedure(s) Performed: EXTERNAL FIXATION LEFT ANKLE (Left Ankle) IRRIGATION AND DEBRIDEMENT left ankle  (Left Ankle) Closed Reduction Ankle (Right Ankle)  Patient Location: PACU  Anesthesia Type:General  Level of Consciousness: awake and sedated  Airway & Oxygen Therapy: Patient Spontanous Breathing  Post-op Assessment: Report given to RN and Post -op Vital signs reviewed and stable  Post vital signs: Reviewed and stable  Last Vitals:  Vitals Value Taken Time  BP 155/123 07/05/19 2157  Temp    Pulse 124 07/05/19 2158  Resp 22 07/05/19 2158  SpO2 99 % 07/05/19 2158  Vitals shown include unvalidated device data.  Last Pain:  Vitals:   07/05/19 1934  TempSrc: Oral  PainSc:          Complications: No apparent anesthesia complications

## 2019-07-05 NOTE — ED Notes (Signed)
Patient transported to CT 
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
## 2019-07-05 NOTE — H&P (Signed)
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  Nose: Nose normal.  Mouth/Throat: No oropharyngeal exudate.  Eyes: Pupils are equal, round, and reactive to light. Right eye exhibits no discharge. Left eye exhibits no discharge. No scleral icterus.  Neck: No tracheal deviation present. No thyromegaly present.  No posterior midline tenderness, no pain on active range of motion.  Cardiovascular: Normal rate, regular rhythm and normal heart sounds.  Respiratory: Effort normal and breath sounds normal. No respiratory distress. She has no wheezes. She has no rales. She exhibits no tenderness.  Tender lumbar spine  GI: Soft. She exhibits no distension. There is no abdominal tenderness. There is no rebound and no guarding.  Musculoskeletal:       Legs:     Comments: Open deformity left ankle, close deformity right ankle, bilateral DP, can move toes bilaterally  Neurological: She displays no atrophy and no tremor. She exhibits normal muscle tone. She displays no seizure activity. GCS eye subscore is 4. GCS verbal  subscore is 5. GCS motor subscore is 6.  Lower extremity strength exam limited by significant bilateral ankle fractures, see above  Skin: Skin is warm.  Psychiatric: She has a normal mood and affect.     Assessment/Plan Jump from balcony L1 fracture -Dr. Kathyrn Sheriff is consulting, keep flat and logroll Right ankle fracture, open left ankle fracture -Ancef IV 72h, cleared to go to the operating room with Dr. Griffin Basil from trauma standpoint.  Admit to trauma,  progressive unit.  Pain control, PT/OT.  Zenovia Jarred, MD 07/05/2019, 7:38 PM

## 2019-07-05 NOTE — ED Notes (Signed)
Nuerosurgery consulted

## 2019-07-05 NOTE — ED Notes (Signed)
Ortho consulted °

## 2019-07-05 NOTE — Anesthesia Preprocedure Evaluation (Signed)
Anesthesia Evaluation  Patient identified by MRN, date of birth, ID band Patient awake    Reviewed: Allergy & Precautions, H&P , NPO status , Patient's Chart, lab work & pertinent test results  Airway Mallampati: II   Neck ROM: full    Dental   Pulmonary neg pulmonary ROS,    breath sounds clear to auscultation       Cardiovascular negative cardio ROS   Rhythm:regular Rate:Normal     Neuro/Psych    GI/Hepatic   Endo/Other    Renal/GU      Musculoskeletal LLE open tibial fx s/p fall 3 stories from window   Abdominal   Peds  Hematology   Anesthesia Other Findings   Reproductive/Obstetrics                             Anesthesia Physical Anesthesia Plan  ASA: II and emergent  Anesthesia Plan: General   Post-op Pain Management:    Induction: Intravenous, Rapid sequence and Cricoid pressure planned  PONV Risk Score and Plan: 3 and Ondansetron, Dexamethasone, Midazolam and Treatment may vary due to age or medical condition  Airway Management Planned: Oral ETT  Additional Equipment:   Intra-op Plan:   Post-operative Plan: Extubation in OR  Informed Consent: I have reviewed the patients History and Physical, chart, labs and discussed the procedure including the risks, benefits and alternatives for the proposed anesthesia with the patient or authorized representative who has indicated his/her understanding and acceptance.       Plan Discussed with: Anesthesiologist, Surgeon and CRNA  Anesthesia Plan Comments:         Anesthesia Quick Evaluation

## 2019-07-05 NOTE — Anesthesia Procedure Notes (Signed)
Procedure Name: Intubation Date/Time: 07/05/2019 8:10 PM Performed by: Valetta Fuller, CRNA Pre-anesthesia Checklist: Patient identified, Emergency Drugs available, Suction available and Patient being monitored Patient Re-evaluated:Patient Re-evaluated prior to induction Oxygen Delivery Method: Circle system utilized Preoxygenation: Pre-oxygenation with 100% oxygen Induction Type: IV induction, Rapid sequence and Cricoid Pressure applied Laryngoscope Size: Miller and 2 Grade View: Grade II Tube size: 7.5 mm Number of attempts: 1 Airway Equipment and Method: Stylet Placement Confirmation: ETT inserted through vocal cords under direct vision,  positive ETCO2 and breath sounds checked- equal and bilateral Secured at: 23 cm Tube secured with: Tape Dental Injury: Teeth and Oropharynx as per pre-operative assessment

## 2019-07-05 NOTE — ED Notes (Signed)
Pt mom Rhonda [phone]

## 2019-07-05 NOTE — ED Notes (Signed)
Trauma MD at bedside.
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
## 2019-07-05 NOTE — ED Notes (Signed)
Ortho at bedside.

## 2019-07-05 NOTE — ED Notes (Signed)
Neurosurgery at the bedside.
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
## 2019-07-05 NOTE — ED Notes (Signed)
Ana Freeman (fiance) [phone]  Please call with updates.

## 2019-07-05 NOTE — ED Notes (Signed)
Mom- Ana Freeman, [phone]
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
## 2019-07-06 ENCOUNTER — Encounter (HOSPITAL_COMMUNITY): Payer: Self-pay | Admitting: *Deleted

## 2019-07-06 ENCOUNTER — Inpatient Hospital Stay (HOSPITAL_COMMUNITY): Payer: BC Managed Care – PPO

## 2019-07-06 DIAGNOSIS — W1789XA Other fall from one level to another, initial encounter: Secondary | ICD-10-CM

## 2019-07-06 DIAGNOSIS — S32010A Wedge compression fracture of first lumbar vertebra, initial encounter for closed fracture: Secondary | ICD-10-CM

## 2019-07-06 DIAGNOSIS — S82851A Displaced trimalleolar fracture of right lower leg, initial encounter for closed fracture: Secondary | ICD-10-CM

## 2019-07-06 DIAGNOSIS — S82872C Displaced pilon fracture of left tibia, initial encounter for open fracture type IIIA, IIIB, or IIIC: Secondary | ICD-10-CM

## 2019-07-06 HISTORY — DX: Wedge compression fracture of first lumbar vertebra, initial encounter for closed fracture: S32.010A

## 2019-07-06 LAB — CBC
HCT: 28.9 % — ABNORMAL LOW (ref 36.0–46.0)
Hemoglobin: 8.3 g/dL — ABNORMAL LOW (ref 12.0–15.0)
MCH: 20.7 pg — ABNORMAL LOW (ref 26.0–34.0)
MCHC: 28.7 g/dL — ABNORMAL LOW (ref 30.0–36.0)
MCV: 72.1 fL — ABNORMAL LOW (ref 80.0–100.0)
Platelets: 299 10*3/uL (ref 150–400)
RBC: 4.01 MIL/uL (ref 3.87–5.11)
RDW: 19.2 % — ABNORMAL HIGH (ref 11.5–15.5)
WBC: 9.8 10*3/uL (ref 4.0–10.5)
nRBC: 0 % (ref 0.0–0.2)

## 2019-07-06 LAB — HIV ANTIBODY (ROUTINE TESTING W REFLEX): HIV Screen 4th Generation wRfx: NONREACTIVE

## 2019-07-06 LAB — BASIC METABOLIC PANEL
Anion gap: 5 (ref 5–15)
BUN: 7 mg/dL (ref 6–20)
CO2: 22 mmol/L (ref 22–32)
Calcium: 9.7 mg/dL (ref 8.9–10.3)
Chloride: 108 mmol/L (ref 98–111)
Creatinine, Ser: 1.09 mg/dL — ABNORMAL HIGH (ref 0.44–1.00)
GFR calc Af Amer: >60 mL/min (ref >60)
GFR calc non Af Amer: >60 mL/min (ref >60)
Glucose, Bld: 191 mg/dL — ABNORMAL HIGH (ref 70–99)
Potassium: 4.6 mmol/L (ref 3.5–5.1)
Sodium: 135 mmol/L (ref 135–145)

## 2019-07-06 MED ORDER — ACETAMINOPHEN 325 MG PO TABS
650.0000 mg | ORAL_TABLET | Freq: Four times a day (QID) | ORAL | Status: DC
  Administered 2019-07-06 – 2019-07-12 (×21): 650 mg via ORAL
  Filled 2019-07-06 (×21): qty 2

## 2019-07-06 MED ORDER — VITAMIN D (ERGOCALCIFEROL) 1.25 MG (50000 UNIT) PO CAPS
50000.0000 [IU] | ORAL_CAPSULE | ORAL | Status: DC
  Administered 2019-07-06: 50000 [IU] via ORAL
  Filled 2019-07-06 (×2): qty 1

## 2019-07-06 MED ORDER — LORAZEPAM 2 MG/ML IJ SOLN
1.0000 mg | Freq: Once | INTRAMUSCULAR | Status: AC
  Administered 2019-07-06: 1 mg via INTRAVENOUS
  Filled 2019-07-06: qty 1

## 2019-07-06 MED ORDER — OXYCODONE HCL 5 MG PO TABS
5.0000 mg | ORAL_TABLET | ORAL | Status: DC | PRN
  Administered 2019-07-06 (×2): 10 mg via ORAL
  Administered 2019-07-06: 5 mg via ORAL
  Administered 2019-07-06 – 2019-07-07 (×5): 10 mg via ORAL
  Filled 2019-07-06 (×5): qty 2
  Filled 2019-07-06: qty 1
  Filled 2019-07-06 (×3): qty 2

## 2019-07-06 MED ORDER — METHOCARBAMOL 500 MG PO TABS
500.0000 mg | ORAL_TABLET | Freq: Three times a day (TID) | ORAL | Status: DC
  Administered 2019-07-06 – 2019-07-09 (×10): 500 mg via ORAL
  Filled 2019-07-06 (×11): qty 1

## 2019-07-06 MED ORDER — HYDROXYZINE HCL 10 MG PO TABS
10.0000 mg | ORAL_TABLET | Freq: Three times a day (TID) | ORAL | Status: DC | PRN
  Administered 2019-07-06: 10 mg via ORAL
  Filled 2019-07-06 (×3): qty 1

## 2019-07-06 MED ORDER — VITAMIN B-12 1000 MCG PO TABS
1000.0000 ug | ORAL_TABLET | Freq: Every day | ORAL | Status: DC
  Administered 2019-07-06 – 2019-07-12 (×6): 1000 ug via ORAL
  Filled 2019-07-06 (×6): qty 1
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
## 2019-07-06 NOTE — Progress Notes (Signed)
Orthopedic Tech Progress Note Patient Details:  Ana Freeman 1971/07/24 [id number]  Patient ID: [name], female   DOB: [birth year], 48 y.o.   MRN: [id number] Called in order to [employer] for brace.  Karolee Stamps 07/06/2019, 10:11 AM
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
## 2019-07-06 NOTE — Progress Notes (Signed)
Rehab Admissions Coordinator Note:  Patient was screened by Michel Santee for appropriateness for an Inpatient Acute Rehab Consult.  At this time, we are recommending Inpatient Rehab consult. I will page for a consult.   Michel Santee 07/06/2019, 4:36 PM  I can be reached at [phone].
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
## 2019-07-06 NOTE — Plan of Care (Signed)
  Problem: Safety: Goal: Ability to remain free from injury will improve Outcome: Progressing   Problem: Skin Integrity: Goal: Risk for impaired skin integrity will decrease Outcome: Progressing   
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
## 2019-07-06 NOTE — Progress Notes (Signed)
CSW completed SBIRT. No further interventions are required.   Domenic Schwab, MSW, [place] Worker [facility]  [phone]

## 2019-07-06 NOTE — Progress Notes (Signed)
PT Cancellation Note  Patient Details Name: Ana Freeman MRN: [id number] DOB: 05/21/1971   Cancelled Treatment:    Reason Eval/Treat Not Completed: Medical issues which prohibited therapy. Awaiting arrival of TLSO. PT to re-attempt eval as time allows.   Lorriane Shire 07/06/2019, 9:42 AM   Lorrin Goodell, PT  Office # [phone] Pager [phone]
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
## 2019-07-07 LAB — BASIC METABOLIC PANEL
Anion gap: 6 (ref 5–15)
BUN: 5 mg/dL — ABNORMAL LOW (ref 6–20)
CO2: 20 mmol/L — ABNORMAL LOW (ref 22–32)
Calcium: 9.4 mg/dL (ref 8.9–10.3)
Chloride: 107 mmol/L (ref 98–111)
Creatinine, Ser: 0.93 mg/dL (ref 0.44–1.00)
GFR calc Af Amer: >60 mL/min (ref >60)
GFR calc non Af Amer: >60 mL/min (ref >60)
Glucose, Bld: 117 mg/dL — ABNORMAL HIGH (ref 70–99)
Potassium: 4.1 mmol/L (ref 3.5–5.1)
Sodium: 133 mmol/L — ABNORMAL LOW (ref 135–145)

## 2019-07-07 LAB — CBC
HCT: 25.4 % — ABNORMAL LOW (ref 36.0–46.0)
Hemoglobin: 7.3 g/dL — ABNORMAL LOW (ref 12.0–15.0)
MCH: 21.1 pg — ABNORMAL LOW (ref 26.0–34.0)
MCHC: 28.7 g/dL — ABNORMAL LOW (ref 30.0–36.0)
MCV: 73.4 fL — ABNORMAL LOW (ref 80.0–100.0)
Platelets: 254 10*3/uL (ref 150–400)
RBC: 3.46 MIL/uL — ABNORMAL LOW (ref 3.87–5.11)
RDW: 19.5 % — ABNORMAL HIGH (ref 11.5–15.5)
WBC: 6.9 10*3/uL (ref 4.0–10.5)
nRBC: 0 % (ref 0.0–0.2)

## 2019-07-07 LAB — SURGICAL PCR SCREEN
MRSA, PCR: NEGATIVE
Staphylococcus aureus: NEGATIVE

## 2019-07-07 MED ORDER — SIMETHICONE 80 MG PO CHEW
80.0000 mg | CHEWABLE_TABLET | Freq: Four times a day (QID) | ORAL | Status: DC | PRN
  Administered 2019-07-07: 80 mg via ORAL
  Filled 2019-07-07: qty 1

## 2019-07-07 MED ORDER — PIPERACILLIN-TAZOBACTAM 3.375 G IVPB
3.3750 g | Freq: Three times a day (TID) | INTRAVENOUS | Status: DC
  Administered 2019-07-07 – 2019-07-08 (×3): 3.375 g via INTRAVENOUS
  Filled 2019-07-07 (×5): qty 50

## 2019-07-07 MED ORDER — MUPIROCIN 2 % EX OINT
1.0000 "application " | TOPICAL_OINTMENT | Freq: Two times a day (BID) | CUTANEOUS | Status: DC
  Administered 2019-07-07 – 2019-07-08 (×2): 1 via NASAL
  Filled 2019-07-07 (×2): qty 22

## 2019-07-07 MED ORDER — CHLORHEXIDINE GLUCONATE 4 % EX LIQD
60.0000 mL | Freq: Once | CUTANEOUS | Status: AC
  Administered 2019-07-08: 4 via TOPICAL
  Filled 2019-07-07: qty 60

## 2019-07-07 MED ORDER — ENSURE PRE-SURGERY PO LIQD
296.0000 mL | Freq: Once | ORAL | Status: AC
  Administered 2019-07-07: 296 mL via ORAL
  Filled 2019-07-07: qty 296

## 2019-07-07 MED ORDER — ENSURE PRE-SURGERY PO LIQD
296.0000 mL | Freq: Once | ORAL | Status: DC
  Filled 2019-07-07: qty 296

## 2019-07-07 MED ORDER — DOCUSATE SODIUM 100 MG PO CAPS
100.0000 mg | ORAL_CAPSULE | Freq: Two times a day (BID) | ORAL | Status: DC
  Administered 2019-07-07 – 2019-07-11 (×8): 100 mg via ORAL
  Filled 2019-07-07 (×9): qty 1

## 2019-07-07 MED ORDER — CEFAZOLIN SODIUM-DEXTROSE 2-4 GM/100ML-% IV SOLN
2.0000 g | INTRAVENOUS | Status: AC
  Administered 2019-07-08: 08:00:00 2 g via INTRAVENOUS
  Filled 2019-07-07: qty 100

## 2019-07-07 MED ORDER — POVIDONE-IODINE 10 % EX SWAB: 2.0000 "application " | Freq: Once | CUTANEOUS | Status: DC

## 2019-07-07 MED ORDER — POLYETHYLENE GLYCOL 3350 17 G PO PACK: 17.0000 g | PACK | Freq: Every day | ORAL | Status: DC | PRN

## 2019-07-07 NOTE — Progress Notes (Addendum)
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 splint, NWB; plan ORIF Sunday  Open left ankle fracture-Ancef IV72h, s/p I&D with ex-fix, plan return to OR Sunday with Dr. Doreatha Martin - NWB  ABL anemia - hgb trended down this am; transfuse for Hgb <7; repeat CBC in am FEN: reg diet, decreased IVF; NPO p mn for surgery VTE: lovenox ID: [email or web address] 7/24>> Follow up: ortho, NS  Dispo: Pain control, PT/OT. OR with ortho in AM; will type and screen with AM labs; add bowel regimen  Leighton Ruff. Redmond Pulling, MD, FACS General, Bariatric, & Minimally Invasive Surgery [facility], [place]   LOS: 2 days    Greer Pickerel 07/07/2019

## 2019-07-07 NOTE — Progress Notes (Addendum)
IV team consulted for a new site and were unable to get a line started, including no midlines access after two different people attempted. Right AC IV is working now but very sensitive to patient side occlusion if patient bends arm at all. Patient scheduled for surgery in am 07/08/19,  and wanted to make a note of this unsuccessful access attempts for OR purposes. See IV team notes. Simmie Davies RN

## 2019-07-07 NOTE — Progress Notes (Signed)
Overall stable.  No new issues or problems.  Moving lower extremities to command.  Sensation appears intact.  Patient with L1 fracture.  Mobilize in brace.  Get upright x-rays once able to be mobilized.  No new recommendations otherwise.

## 2019-07-07 NOTE — Progress Notes (Signed)
Patient ID: Ana Freeman, female   DOB: 01-23-1971, 48 y.o.   MRN: [id number]   LOS: 2 days   Subjective: Pt stable, states she's doing ok. No event overnight.   Objective: Vital signs in last 24 hours: Temp:  [98.2 F (36.8 C)-98.3 F (36.8 C)] 98.3 F (36.8 C) (07/24 2356) Pulse Rate:  [98-115] 98 (07/25 0400) Resp:  [11-19] 18 (07/25 0400) BP: (110-120)/(67-85) 111/75 (07/25 0400) SpO2:  [99 %-100 %] 100 % (07/25 0400) Last BM Date: 07/04/19   Laboratory  CBC Recent Labs    07/06/19 0221 07/07/19 0533  WBC 9.8 6.9  HGB 8.3* 7.3*  HCT 28.9* 25.4*  PLT 299 254   BMET Recent Labs    07/06/19 0221 07/07/19 0533  NA 135 133*  K 4.6 4.1  CL 108 107  CO2 22 20*  GLUCOSE 191* 117*  BUN 7 5*  CREATININE 1.09* 0.93  CALCIUM 9.7 9.4     Physical Exam General appearance: alert and no distress  RLE: Splint in place, sensation intact, 5/5 EHL LLE: Ex fix, VAC, diffuse paresthesias. 4/5 EHL (likely 2/2 pain)   Assessment/Plan: Fall Right ankle fx -- Plan ORIF tomorrow by Dr. Doreatha Martin Left ankle fx -- Plan repeat I&D tomorrow by Dr. Doreatha Martin with ORIF later in the week depending on soft tissue appearance. NPO after MN. L1 fx -- per [place], PA-C Orthopedic Surgery [phone] 07/07/2019

## 2019-07-08 ENCOUNTER — Inpatient Hospital Stay (HOSPITAL_COMMUNITY): Payer: BC Managed Care – PPO | Admitting: Anesthesiology

## 2019-07-08 ENCOUNTER — Encounter (HOSPITAL_COMMUNITY): Admission: EM | Disposition: A | Discharge status: Home Health | Payer: Self-pay | Source: Home / Self Care

## 2019-07-08 ENCOUNTER — Inpatient Hospital Stay (HOSPITAL_COMMUNITY): Payer: BC Managed Care – PPO

## 2019-07-08 HISTORY — PX: ORIF ANKLE FRACTURE: SHX5408

## 2019-07-08 HISTORY — PX: I & D EXTREMITY: SHX5045

## 2019-07-08 LAB — CBC
HCT: 26.2 % — ABNORMAL LOW (ref 36.0–46.0)
Hemoglobin: 7.8 g/dL — ABNORMAL LOW (ref 12.0–15.0)
MCH: 21.6 pg — ABNORMAL LOW (ref 26.0–34.0)
MCHC: 29.8 g/dL — ABNORMAL LOW (ref 30.0–36.0)
MCV: 72.6 fL — ABNORMAL LOW (ref 80.0–100.0)
Platelets: 284 10*3/uL (ref 150–400)
RBC: 3.61 MIL/uL — ABNORMAL LOW (ref 3.87–5.11)
RDW: 19.2 % — ABNORMAL HIGH (ref 11.5–15.5)
WBC: 8.2 10*3/uL (ref 4.0–10.5)
nRBC: 0 % (ref 0.0–0.2)

## 2019-07-08 LAB — TYPE AND SCREEN
ABO/RH(D): O POS
Antibody Screen: NEGATIVE

## 2019-07-08 LAB — ABO/RH: ABO/RH(D): O POS

## 2019-07-08 SURGERY — IRRIGATION AND DEBRIDEMENT EXTREMITY
Anesthesia: General | Laterality: Right

## 2019-07-08 MED ORDER — OXYCODONE HCL 5 MG PO TABS
10.0000 mg | ORAL_TABLET | ORAL | Status: DC | PRN
  Administered 2019-07-08: 10 mg via ORAL
  Administered 2019-07-08 – 2019-07-11 (×4): 15 mg via ORAL
  Filled 2019-07-08 (×2): qty 3
  Filled 2019-07-08: qty 2
  Filled 2019-07-08 (×3): qty 3

## 2019-07-08 MED ORDER — SUCCINYLCHOLINE CHLORIDE 200 MG/10ML IV SOSY
PREFILLED_SYRINGE | INTRAVENOUS | Status: AC
  Filled 2019-07-08: qty 10

## 2019-07-08 MED ORDER — DEXAMETHASONE SODIUM PHOSPHATE 10 MG/ML IJ SOLN
INTRAMUSCULAR | Status: DC | PRN
  Administered 2019-07-08: 4 mg via INTRAVENOUS

## 2019-07-08 MED ORDER — OXYCODONE HCL 5 MG PO TABS
5.0000 mg | ORAL_TABLET | Freq: Once | ORAL | Status: AC | PRN
  Administered 2019-07-08: 5 mg via ORAL

## 2019-07-08 MED ORDER — MIDAZOLAM HCL 2 MG/2ML IJ SOLN
INTRAMUSCULAR | Status: AC
  Filled 2019-07-08: qty 2

## 2019-07-08 MED ORDER — BUPIVACAINE HCL 0.5 % IJ SOLN
INTRAMUSCULAR | Status: AC
  Filled 2019-07-08: qty 1

## 2019-07-08 MED ORDER — VANCOMYCIN HCL 1000 MG IV SOLR
INTRAVENOUS | Status: DC | PRN
  Administered 2019-07-08: 1000 mg

## 2019-07-08 MED ORDER — PIPERACILLIN-TAZOBACTAM 3.375 G IVPB
3.3750 g | Freq: Three times a day (TID) | INTRAVENOUS | Status: DC
  Administered 2019-07-08 – 2019-07-09 (×3): 3.375 g via INTRAVENOUS
  Filled 2019-07-08 (×6): qty 50

## 2019-07-08 MED ORDER — MIDAZOLAM HCL 5 MG/5ML IJ SOLN
INTRAMUSCULAR | Status: DC | PRN
  Administered 2019-07-08: 2 mg via INTRAVENOUS

## 2019-07-08 MED ORDER — FENTANYL CITRATE (PF) 250 MCG/5ML IJ SOLN
INTRAMUSCULAR | Status: AC
  Filled 2019-07-08: qty 5

## 2019-07-08 MED ORDER — ROCURONIUM BROMIDE 10 MG/ML (PF) SYRINGE
PREFILLED_SYRINGE | INTRAVENOUS | Status: DC | PRN
  Administered 2019-07-08: 20 mg via INTRAVENOUS
  Administered 2019-07-08: 40 mg via INTRAVENOUS

## 2019-07-08 MED ORDER — ACETAMINOPHEN 10 MG/ML IV SOLN
INTRAVENOUS | Status: DC | PRN
  Administered 2019-07-08: 1000 mg via INTRAVENOUS

## 2019-07-08 MED ORDER — CEFAZOLIN SODIUM 1 G IJ SOLR
INTRAMUSCULAR | Status: AC
  Filled 2019-07-08: qty 20

## 2019-07-08 MED ORDER — DEXMEDETOMIDINE HCL IN NACL 200 MCG/50ML IV SOLN
INTRAVENOUS | Status: AC
  Filled 2019-07-08: qty 50

## 2019-07-08 MED ORDER — OXYCODONE HCL 5 MG PO TABS
5.0000 mg | ORAL_TABLET | ORAL | Status: DC | PRN
  Administered 2019-07-09 – 2019-07-11 (×9): 10 mg via ORAL
  Filled 2019-07-08 (×11): qty 2

## 2019-07-08 MED ORDER — BUPIVACAINE HCL 0.5 % IJ SOLN
INTRAMUSCULAR | Status: DC | PRN
  Administered 2019-07-08: 10 mL

## 2019-07-08 MED ORDER — LIDOCAINE 2% (20 MG/ML) 5 ML SYRINGE
INTRAMUSCULAR | Status: DC | PRN
  Administered 2019-07-08: 60 mg via INTRAVENOUS

## 2019-07-08 MED ORDER — HYDROMORPHONE HCL 1 MG/ML IJ SOLN
INTRAMUSCULAR | Status: AC
  Filled 2019-07-08: qty 2

## 2019-07-08 MED ORDER — ACETAMINOPHEN 10 MG/ML IV SOLN
INTRAVENOUS | Status: AC
  Filled 2019-07-08: qty 100

## 2019-07-08 MED ORDER — KETOROLAC TROMETHAMINE 30 MG/ML IJ SOLN: 30.0000 mg | Freq: Once | INTRAMUSCULAR | Status: DC

## 2019-07-08 MED ORDER — OXYCODONE HCL 5 MG/5ML PO SOLN: 5.0000 mg | Freq: Once | ORAL | Status: AC | PRN

## 2019-07-08 MED ORDER — LIDOCAINE 2% (20 MG/ML) 5 ML SYRINGE
INTRAMUSCULAR | Status: AC
  Filled 2019-07-08: qty 5

## 2019-07-08 MED ORDER — DEXMEDETOMIDINE HCL IN NACL 200 MCG/50ML IV SOLN
INTRAVENOUS | Status: DC | PRN
  Administered 2019-07-08: 4 ug via INTRAVENOUS
  Administered 2019-07-08: 8 ug via INTRAVENOUS

## 2019-07-08 MED ORDER — ONDANSETRON HCL 4 MG/2ML IJ SOLN
INTRAMUSCULAR | Status: AC
  Filled 2019-07-08: qty 2

## 2019-07-08 MED ORDER — DEXAMETHASONE SODIUM PHOSPHATE 10 MG/ML IJ SOLN
INTRAMUSCULAR | Status: AC
  Filled 2019-07-08: qty 1

## 2019-07-08 MED ORDER — 0.9 % SODIUM CHLORIDE (POUR BTL) OPTIME
TOPICAL | Status: DC | PRN
  Administered 2019-07-08: 1000 mL

## 2019-07-08 MED ORDER — GABAPENTIN 100 MG PO CAPS
100.0000 mg | ORAL_CAPSULE | Freq: Three times a day (TID) | ORAL | Status: DC
  Administered 2019-07-08 – 2019-07-09 (×4): 100 mg via ORAL
  Filled 2019-07-08 (×5): qty 1

## 2019-07-08 MED ORDER — CEFAZOLIN SODIUM-DEXTROSE 2-4 GM/100ML-% IV SOLN
INTRAVENOUS | Status: AC
  Filled 2019-07-08: qty 100

## 2019-07-08 MED ORDER — PHENYLEPHRINE 40 MCG/ML (10ML) SYRINGE FOR IV PUSH (FOR BLOOD PRESSURE SUPPORT)
PREFILLED_SYRINGE | INTRAVENOUS | Status: AC
  Filled 2019-07-08: qty 10

## 2019-07-08 MED ORDER — SODIUM CHLORIDE 0.9 % IR SOLN
Status: DC | PRN
  Administered 2019-07-08 (×3): 3000 mL

## 2019-07-08 MED ORDER — TOBRAMYCIN SULFATE 1.2 G IJ SOLR
INTRAMUSCULAR | Status: DC | PRN
  Administered 2019-07-08: 1.2 g

## 2019-07-08 MED ORDER — GLYCOPYRROLATE PF 0.2 MG/ML IJ SOSY
PREFILLED_SYRINGE | INTRAMUSCULAR | Status: AC
  Filled 2019-07-08: qty 2

## 2019-07-08 MED ORDER — ROCURONIUM BROMIDE 10 MG/ML (PF) SYRINGE
PREFILLED_SYRINGE | INTRAVENOUS | Status: AC
  Filled 2019-07-08: qty 40

## 2019-07-08 MED ORDER — LACTATED RINGERS IV SOLN
INTRAVENOUS | Status: DC | PRN
  Administered 2019-07-08: 08:00:00 via INTRAVENOUS

## 2019-07-08 MED ORDER — PHENYLEPHRINE 40 MCG/ML (10ML) SYRINGE FOR IV PUSH (FOR BLOOD PRESSURE SUPPORT)
PREFILLED_SYRINGE | INTRAVENOUS | Status: AC
  Filled 2019-07-08: qty 30

## 2019-07-08 MED ORDER — ARTIFICIAL TEARS OPHTHALMIC OINT
TOPICAL_OINTMENT | OPHTHALMIC | Status: AC
  Filled 2019-07-08: qty 7

## 2019-07-08 MED ORDER — PROPOFOL 10 MG/ML IV BOLUS
INTRAVENOUS | Status: DC | PRN
  Administered 2019-07-08: 30 mg via INTRAVENOUS
  Administered 2019-07-08: 20 mg via INTRAVENOUS
  Administered 2019-07-08: 150 mg via INTRAVENOUS

## 2019-07-08 MED ORDER — ACETAMINOPHEN 10 MG/ML IV SOLN
1000.0000 mg | Freq: Once | INTRAVENOUS | Status: DC | PRN
  Administered 2019-07-08: 1000 mg via INTRAVENOUS

## 2019-07-08 MED ORDER — PHENYLEPHRINE 40 MCG/ML (10ML) SYRINGE FOR IV PUSH (FOR BLOOD PRESSURE SUPPORT)
PREFILLED_SYRINGE | INTRAVENOUS | Status: DC | PRN
  Administered 2019-07-08: 80 ug via INTRAVENOUS

## 2019-07-08 MED ORDER — VANCOMYCIN HCL 1000 MG IV SOLR
INTRAVENOUS | Status: AC
  Filled 2019-07-08: qty 1000

## 2019-07-08 MED ORDER — FENTANYL CITRATE (PF) 250 MCG/5ML IJ SOLN
INTRAMUSCULAR | Status: DC | PRN
  Administered 2019-07-08: 100 ug via INTRAVENOUS
  Administered 2019-07-08 (×8): 50 ug via INTRAVENOUS

## 2019-07-08 MED ORDER — PROMETHAZINE HCL 25 MG/ML IJ SOLN: 6.2500 mg | INTRAMUSCULAR | Status: DC | PRN

## 2019-07-08 MED ORDER — DEXAMETHASONE SODIUM PHOSPHATE 10 MG/ML IJ SOLN
INTRAMUSCULAR | Status: AC
  Filled 2019-07-08: qty 2

## 2019-07-08 MED ORDER — PROPOFOL 10 MG/ML IV BOLUS
INTRAVENOUS | Status: AC
  Filled 2019-07-08: qty 20

## 2019-07-08 MED ORDER — OXYCODONE HCL 5 MG PO TABS
ORAL_TABLET | ORAL | Status: AC
  Administered 2019-07-08: 15 mg via ORAL
  Filled 2019-07-08: qty 1

## 2019-07-08 MED ORDER — HYDROMORPHONE HCL 1 MG/ML IJ SOLN
0.2500 mg | INTRAMUSCULAR | Status: DC | PRN
  Administered 2019-07-08 (×3): 0.5 mg via INTRAVENOUS

## 2019-07-08 MED ORDER — ONDANSETRON HCL 4 MG/2ML IJ SOLN
INTRAMUSCULAR | Status: DC | PRN
  Administered 2019-07-08: 4 mg via INTRAVENOUS

## 2019-07-08 MED ORDER — BACITRACIN ZINC 500 UNIT/GM EX OINT
TOPICAL_OINTMENT | CUTANEOUS | Status: AC
  Filled 2019-07-08: qty 28.35

## 2019-07-08 MED ORDER — TOBRAMYCIN SULFATE 1.2 G IJ SOLR
INTRAMUSCULAR | Status: AC
  Filled 2019-07-08: qty 1.2

## 2019-07-08 MED ORDER — SUGAMMADEX SODIUM 200 MG/2ML IV SOLN
INTRAVENOUS | Status: DC | PRN
  Administered 2019-07-08: 149.6 mg via INTRAVENOUS

## 2019-07-08 MED ORDER — EPHEDRINE 5 MG/ML INJ
INTRAVENOUS | Status: AC
  Filled 2019-07-08: qty 10

## 2019-07-08 SURGICAL SUPPLY — 104 items
APL PRP STRL LF DISP 70% ISPRP (MISCELLANEOUS) ×2
BANDAGE ESMARK 6X9 LF (GAUZE/BANDAGES/DRESSINGS) ×2 IMPLANT
BAR EXFX 150X11 NS LF (EXFIX) ×4
BAR GLASS FIBER EXFX 11X150 (EXFIX) ×4 IMPLANT
BIT DRILL 2.5X2.75 QC CALB (BIT) ×2 IMPLANT
BIT DRILL CALIBRATED 2.7 (BIT) ×1 IMPLANT
BIT DRILL CALIBRATED 2.7MM (BIT) ×1
BLADE SURG 10 STRL SS (BLADE) ×2 IMPLANT
BLADE SURG 15 STRL LF DISP TIS (BLADE) IMPLANT
BLADE SURG 15 STRL SS (BLADE) ×4
BNDG CMPR 9X6 STRL LF SNTH (GAUZE/BANDAGES/DRESSINGS) ×2
BNDG CMPR MED 10X6 ELC LF (GAUZE/BANDAGES/DRESSINGS) ×2
BNDG COHESIVE 4X5 TAN STRL (GAUZE/BANDAGES/DRESSINGS) ×2 IMPLANT
BNDG ELASTIC 4X5.8 VLCR STR LF (GAUZE/BANDAGES/DRESSINGS) ×2 IMPLANT
BNDG ELASTIC 6X10 VLCR STRL LF (GAUZE/BANDAGES/DRESSINGS) ×2 IMPLANT
BNDG ELASTIC 6X5.8 VLCR STR LF (GAUZE/BANDAGES/DRESSINGS) IMPLANT
BNDG ESMARK 6X9 LF (GAUZE/BANDAGES/DRESSINGS) ×4
BNDG GAUZE ELAST 4 BULKY (GAUZE/BANDAGES/DRESSINGS) ×6 IMPLANT
BRUSH SCRUB EZ PLAIN DRY (MISCELLANEOUS) ×8 IMPLANT
CANISTER WOUND CARE 500ML ATS (WOUND CARE) ×2 IMPLANT
CHLORAPREP W/TINT 26 (MISCELLANEOUS) ×4 IMPLANT
CLAMP BLUE BAR TO BAR (EXFIX) ×4 IMPLANT
CLAMP BLUE BAR TO PIN (EXFIX) ×8 IMPLANT
CLOSURE STERI-STRIP 1/2X4 (GAUZE/BANDAGES/DRESSINGS) ×1
CLSR STERI-STRIP ANTIMIC 1/2X4 (GAUZE/BANDAGES/DRESSINGS) ×1 IMPLANT
COVER MAYO STAND STRL (DRAPES) ×4 IMPLANT
COVER SURGICAL LIGHT HANDLE (MISCELLANEOUS) ×8 IMPLANT
COVER WAND RF STERILE (DRAPES) ×4 IMPLANT
DRAPE C-ARM 42X72 X-RAY (DRAPES) ×4 IMPLANT
DRAPE C-ARMOR (DRAPES) ×4 IMPLANT
DRAPE ORTHO SPLIT 77X108 STRL (DRAPES) ×8
DRAPE SURG 17X23 STRL (DRAPES) ×4 IMPLANT
DRAPE SURG ORHT 6 SPLT 77X108 (DRAPES) ×4 IMPLANT
DRAPE U-SHAPE 47X51 STRL (DRAPES) ×6 IMPLANT
DRESSING PEEL AND PLAC PRVNA20 (GAUZE/BANDAGES/DRESSINGS) IMPLANT
DRSG ADAPTIC 3X8 NADH LF (GAUZE/BANDAGES/DRESSINGS) ×2 IMPLANT
DRSG PEEL AND PLACE PREVENA 20 (GAUZE/BANDAGES/DRESSINGS) ×4
ELECT REM PT RETURN 9FT ADLT (ELECTROSURGICAL) ×4
ELECTRODE REM PT RTRN 9FT ADLT (ELECTROSURGICAL) ×2 IMPLANT
EVACUATOR 1/8 PVC DRAIN (DRAIN) IMPLANT
GAUZE SPONGE 4X4 12PLY STRL (GAUZE/BANDAGES/DRESSINGS) ×4 IMPLANT
GLOVE BIO SURGEON STRL SZ 6.5 (GLOVE) ×9 IMPLANT
GLOVE BIO SURGEON STRL SZ7.5 (GLOVE) ×20 IMPLANT
GLOVE BIO SURGEONS STRL SZ 6.5 (GLOVE) ×3
GLOVE BIOGEL PI IND STRL 6.5 (GLOVE) ×2 IMPLANT
GLOVE BIOGEL PI IND STRL 7.5 (GLOVE) ×2 IMPLANT
GLOVE BIOGEL PI INDICATOR 6.5 (GLOVE) ×2
GLOVE BIOGEL PI INDICATOR 7.5 (GLOVE) ×8
GOWN STRL REUS W/ TWL LRG LVL3 (GOWN DISPOSABLE) ×4 IMPLANT
GOWN STRL REUS W/TWL LRG LVL3 (GOWN DISPOSABLE) ×8
HALF PIN 3MM (EXFIX) ×4 IMPLANT
HANDPIECE INTERPULSE COAX TIP (DISPOSABLE) ×4
K-WIRE ACE 1.6X6 ×2 IMPLANT
KIT BASIN OR (CUSTOM PROCEDURE TRAY) ×4 IMPLANT
KIT TURNOVER KIT B (KITS) ×4 IMPLANT
MANIFOLD NEPTUNE II (INSTRUMENTS) ×4 IMPLANT
NDL HYPO 21X1.5 SAFETY (NEEDLE) IMPLANT
NDL HYPO 25GX1X1/2 BEV (NEEDLE) ×2 IMPLANT
NEEDLE HYPO 21X1.5 SAFETY (NEEDLE) IMPLANT
NEEDLE HYPO 25GX1X1/2 BEV (NEEDLE) ×4 IMPLANT
NS IRRIG 1000ML POUR BTL (IV SOLUTION) ×4 IMPLANT
PACK ORTHO EXTREMITY (CUSTOM PROCEDURE TRAY) ×4 IMPLANT
PACK TOTAL JOINT (CUSTOM PROCEDURE TRAY) ×4 IMPLANT
PAD ARMBOARD 7.5X6 YLW CONV (MISCELLANEOUS) ×8 IMPLANT
PAD CAST 4YDX4 CTTN HI CHSV (CAST SUPPLIES) IMPLANT
PADDING CAST ABS 6INX4YD NS (CAST SUPPLIES) ×2
PADDING CAST ABS COTTON 6X4 NS (CAST SUPPLIES) IMPLANT
PADDING CAST COTTON 4X4 STRL (CAST SUPPLIES) ×4
PADDING CAST COTTON 6X4 STRL (CAST SUPPLIES) ×4 IMPLANT
PIN TRANSFIXING 5.0 (EXFIX) ×4 IMPLANT
PLATE LOCK 3H 95 RT DIST FIB (Plate) ×2 IMPLANT
SCREW LOCK CORT STAR 3.5X10 (Screw) ×2 IMPLANT
SCREW LOCK CORT STAR 3.5X12 (Screw) ×2 IMPLANT
SCREW LOCK CORT STAR 3.5X14 (Screw) ×4 IMPLANT
SCREW LP 3.5X65MM (Screw) ×2 IMPLANT
SCREW NON LOCKING LP 3.5 14MM (Screw) ×2 IMPLANT
SCREW NON LOCKING LP 3.5 16MM (Screw) ×4 IMPLANT
SET HNDPC FAN SPRY TIP SCT (DISPOSABLE) IMPLANT
SPLINT PLASTER CAST XFAST 5X30 (CAST SUPPLIES) IMPLANT
SPLINT PLASTER XFAST SET 5X30 (CAST SUPPLIES) ×2
SPONGE LAP 18X18 RF (DISPOSABLE) ×4 IMPLANT
SPONGE LAP 18X18 X RAY DECT (DISPOSABLE) ×2 IMPLANT
STAPLER VISISTAT 35W (STAPLE) ×4 IMPLANT
SUCTION FRAZIER HANDLE 10FR (MISCELLANEOUS) ×2
SUCTION TUBE FRAZIER 10FR DISP (MISCELLANEOUS) ×2 IMPLANT
SUT ETHILON 2 0 FS 18 (SUTURE) ×10 IMPLANT
SUT ETHILON 3 0 PS 1 (SUTURE) ×4 IMPLANT
SUT MNCRL AB 3-0 PS2 18 (SUTURE) ×4 IMPLANT
SUT MON AB 2-0 CT1 36 (SUTURE) ×2 IMPLANT
SUT PROLENE 0 CT (SUTURE) ×4 IMPLANT
SUT VIC AB 0 CT1 27 (SUTURE)
SUT VIC AB 0 CT1 27XBRD ANBCTR (SUTURE) ×2 IMPLANT
SUT VIC AB 2-0 CT1 27 (SUTURE) ×16
SUT VIC AB 2-0 CT1 TAPERPNT 27 (SUTURE) ×4 IMPLANT
SWAB CULTURE ESWAB REG 1ML (MISCELLANEOUS) IMPLANT
SYR CONTROL 10ML LL (SYRINGE) ×4 IMPLANT
TOWEL GREEN STERILE (TOWEL DISPOSABLE) ×8 IMPLANT
TOWEL GREEN STERILE FF (TOWEL DISPOSABLE) ×4 IMPLANT
TRAY FOLEY SLVR 14FR TEMP STAT (SET/KITS/TRAYS/PACK) ×2 IMPLANT
TUBE CONNECTING 12'X1/4 (SUCTIONS) ×1
TUBE CONNECTING 12X1/4 (SUCTIONS) ×3 IMPLANT
UNDERPAD 30X30 (UNDERPADS AND DIAPERS) ×4 IMPLANT
WATER STERILE IRR 1000ML POUR (IV SOLUTION) ×4 IMPLANT
YANKAUER SUCT BULB TIP NO VENT (SUCTIONS) ×4 IMPLANT

## 2019-07-08 NOTE — Anesthesia Procedure Notes (Signed)
Procedure Name: Intubation Date/Time: 07/08/2019 7:55 AM Performed by: Renato Shin, CRNA Pre-anesthesia Checklist: Patient identified, Emergency Drugs available, Suction available and Patient being monitored Patient Re-evaluated:Patient Re-evaluated prior to induction Oxygen Delivery Method: Circle system utilized Preoxygenation: Pre-oxygenation with 100% oxygen Induction Type: IV induction Ventilation: Mask ventilation without difficulty Laryngoscope Size: Miller and 2 Grade View: Grade I Tube type: Oral Tube size: 7.0 mm Number of attempts: 1 Airway Equipment and Method: Stylet and Oral airway Placement Confirmation: ETT inserted through vocal cords under direct vision,  positive ETCO2 and breath sounds checked- equal and bilateral Secured at: 21 cm Tube secured with: Tape Dental Injury: Teeth and Oropharynx as per pre-operative assessment

## 2019-07-08 NOTE — Progress Notes (Signed)
Ortho Trauma Progress Note  Doing well. No questions. Pain controlled.  RLE: Splint in place. Neurovascularly intact  LLE: Ex-fix in place, incisional vac with serosanguinous output. Neurovascularly intact  A/P L open pilon, right trimalleolar fracture  Plan for repeat I&D today with adjustment of external fixator of left ankle and ORIF of right ankle today. Risks and benefits discussed. Risks discussed included bleeding requiring blood transfusion, bleeding causing a hematoma, infection, malunion, nonunion, damage to surrounding nerves and blood vessels, pain, hardware prominence or irritation, hardware failure, stiffness, post-traumatic arthritis, DVT/PE, compartment syndrome, and even death. She agrees to proceed with surgery and consent obtained.  Shona Needles, MD [facility] [phone] (phone) [phone] (office) [email or web address]

## 2019-07-08 NOTE — Op Note (Signed)
Orthopaedic Surgery Operative Note (CSN: [id number] ) Date of Surgery: 07/08/2019  Admit Date: 07/05/2019   Diagnoses: Pre-Op Diagnoses: Right trimalleolar ankle fracture Right lateral talar process fracture Left Type IIIA open pilon fracture   Post-Op Diagnosis: Same  Procedures: 1. CPT 27822-Open reduction internal fixation of right trimalleolar ankle fracture 2. CPT 28430-Closed treatment of lateral talar process fracture 3. CPT 77071-Stress examination of right ankle 4. CPT 20693-Adjustment of external fixator left ankle 5. CPT 11012-Irrigation and debridement of left open pilon fracture 6. CPT 27825-Closed reduction of left pilon  Surgeons : Primary: Haddix, Thomasene Lot, MD  Assistant: Patrecia Pace, PA-C  Location: OR 3   Anesthesia:General  Antibiotics: Ancef 2g preop   Tourniquet time: None  Estimated Blood OJJK:093 mL  Complications:None  Specimens:None   Implants: Implant Name Type Inv. Item Serial No. Manufacturer Lot No. LRB No. Used Action  PLATE LOCK 3H 95 RT DIST FIB - [id number] Plate PLATE LOCK 3H 95 RT DIST FIB  [employer](ORTH,TRAU,BIO,SG)  Right 1 Implanted  SCREW LOCK CORT STAR 3.5X10 - [id number] Screw SCREW LOCK CORT STAR 3.5X10  [employer](ORTH,TRAU,BIO,SG)  Right 1 Implanted  SCREW LOCK CORT STAR 3.5X12 - [id number] Screw SCREW LOCK CORT STAR 3.5X12  [employer](ORTH,TRAU,BIO,SG)  Right 1 Implanted  SCREW LOCK CORT STAR 3.5X14 - [id number] Screw SCREW LOCK CORT STAR 3.5X14  [employer](ORTH,TRAU,BIO,SG)  Right 2 Implanted  SCREW NON LOCKING LP 3.5 16MM - [id number] Screw SCREW NON LOCKING LP 3.5 16MM  [employer](ORTH,TRAU,BIO,SG)  Right 2 Implanted  SCREW LP 3.5X65MM - [id number] Screw SCREW LP 3.5X65MM  [employer](ORTH,TRAU,BIO,SG)  Right 1 Implanted  SCREW NON LOCKING LP 3.5 14MM - [id number] Screw SCREW NON LOCKING LP 3.5 14MM  [employer](ORTH,TRAU,BIO,SG)  Right 1 Implanted     Indications for Surgery: 48 year old female s/p fall from  height with left open pilon fracture s/p I&D and external fixation with Dr. Griffin Basil. The wound and tibia was grossly contaminated and I recommend proceeding with repeat I&D and adjustment of external fixation. The patient also had a closed right trimalleolar ankle fracture.  This was placed in the splint.  I recommended proceeding with open reduction internal fixation of the right ankle as well.  CT scan was obtained which showed a lateral talar process fracture.  The fracture was nondisplaced and I recommended proceeding with nonoperative treatment.  Risks and benefits were discussed with the patient.  Risks discussed included bleeding requiring blood transfusion, bleeding causing a hematoma, infection, malunion, nonunion, damage to surrounding nerves and blood vessels, pain, hardware prominence or irritation, hardware failure, stiffness, post-traumatic arthritis, DVT/PE, compartment syndrome, and even death.  Patient agreed to proceed with surgery and consent was obtained.  Operative Findings: 1.  Open reduction internal fixation of right trimalleolar ankle fracture using [employer] Alps anatomic distal fibular plate and one 3.5 mm medial malleolus screw. 2.  External rotation stress view showed no syndesmotic widening. 3.  Lateral talus process fracture treated nonoperatively as it was stable. 4.  Repeat irrigation and debridement of left open distal tibia fracture with no signs of gross contamination. 5.  Adjustment of left external fixation with placement of fifth and first metatarsal pins. 6.  Incisional wound VAC placement.  Procedure: The patient was identified in the preoperative holding area. Consent was confirmed with the patient and all questions were answered. The operative extremities was marked after confirmation with the patient. she was then brought back to the operating room by our anesthesia colleagues.  She  was carefully transferred over to a radiolucent flat top table.  She was  placed under general anesthetic.  A bump was placed under her right hip.  Bilateral lower extremities were then prepped and draped in usual sterile fashion.  A pre-incision timeout was performed to verify the patient the procedure and the extremities.  Preoperative antibiotics were dosed.  I started out with the right lower extremity as I did not want to contaminate of the hardware and fixation.  A standard lateral approach to the distal fibula was made.  Was carried down through skin and subcutaneous tissue.  I carefully dissected out the superficial peroneal nerve.  I used a 15 blade to resect the periosteum and visualize the oblique distal fibula fracture.  This was clamped and held provisionally while I chose a plate.  A [employer] distal fibular anatomic Alps plate was chosen.  I placed a nonlocking screw into the fibular shaft.  Total of 4 locking screws were placed distally.  Another 2 nonlocking screws were placed into the fibular shaft.  I then turned my attention to the medial side.  A small curvilinear incision was made over the medial malleolus fragment.  Was carried down through skin and subcutaneous tissue.  I dissected out the saphenous neurovascular bundle.  I resected periosteum and placed a unicortical drill hole in the metaphysis.  I used a tenaculum to anatomically reduce the fracture.  I then drilled and placed a 3.5 mm bicortical nonlocking screw.  Her fracture was small and I can only hold 1 screw.  A external rotation stress view was then performed and there is no medial clear space widening.  As result I felt that the syndesmosis did not need any fixation.  I then at that point chose to treat the lateral talar process fracture nonoperatively as well as under fluoroscopy it appeared nondisplaced and not unstable.  The incisions were then copiously irrigated final fluoroscopic images were obtained.  Some vanc and tobramycin powder was placed into the incisions.  A 2-0 Vicryl suture  was used to close the skin along with 3-0 Monocryl and Steri-Strips.  This was covered and I returned to the left lower extremity.  I loosen the external fixator removed the sutures that were in place already and delivered the distal tibia through the wound.  I used a curette and ronguer to debride the cancellus bone.  There is no gross contamination.  I also used these instruments to debride the distal portion of the tibia as well as the fibula fracture.  I also explored proximal and distal to the wound and was not able to find any gross contamination.  I then used low pressure pulsatile lavage to thoroughly irrigate the bone ends as well as the wound with total of 9 L of normal saline.  Gloves and instruments were then changed and I turned my attention to adjustment of the external fixator.  She had two calcaneus pins to control the talus however her midfoot was still in a significant amount of plantarflexion. As result I felt that providing first and fifth metatarsal pins would place her foot in a better position as she may be an external fixation for a prolonged period of time.  Using fluoroscopy as a guide I made incisions and placed a 3.2mm threaded half pins in the fifth and first metatarsal.  Bicortical fixation was obtained.  I connected the pins to the calcaneus pin using pin to bar clamps.  I then constructed delta frame using  a bar to bar clamps and 11 mm bars connecting to the tibial pins that were already in place.  A reduction maneuver was performed fluoroscopy confirmed on AP and lateral views that alignment was appropriate.  I then tightened the construct.  One gram of vancomycin powder 1.2 g of tobramycin powder were placed into the wound.  I then closed the traumatic laceration with a 2-0 Monocryl and 3-0 nylon.  A sterile dressing consisting of Praveena VAC to the traumatic laceration as well as Kerlex to the pin sites was placed.  It was then wrapped with web roll and a 4 inch Ace wrap.   The right lower extremity was then dressed with 4 x 4's, sterile cast padding and a well-padded short leg splint was then placed.  The patient was then awoken from anesthesia and taken to the PACU in stable condition.  Post Op Plan/Instructions: Nonweightbearing bilateral lower extremities. Continue zosyn for another 48 hours. Swelling and wound check Tuesday and will decide on definitive fixation. Lovenox for VTE prophylaxis.  I was present and performed the entire surgery.  Patrecia Pace, PA-C did assist me throughout the case. An assistant was necessary given the difficulty in approach, maintenance of reduction and ability to instrument the fracture.   Katha Hamming, MD [facility]

## 2019-07-08 NOTE — Anesthesia Preprocedure Evaluation (Addendum)
Anesthesia Evaluation  Patient identified by MRN, date of birth, ID band Patient awake    Reviewed: Allergy & Precautions, NPO status , Patient's Chart, lab work & pertinent test results  Airway Mallampati: III  TM Distance: >3 FB Neck ROM: Full    Dental no notable dental hx.    Pulmonary neg pulmonary ROS,    Pulmonary exam normal breath sounds clear to auscultation       Cardiovascular negative cardio ROS Normal cardiovascular exam Rhythm:Regular Rate:Normal     Neuro/Psych negative neurological ROS  negative psych ROS   GI/Hepatic negative GI ROS, Neg liver ROS,   Endo/Other  negative endocrine ROS  Renal/GU negative Renal ROS     Musculoskeletal L1 fracture   Abdominal   Peds  Hematology  (+) anemia ,   Anesthesia Other Findings ft open pilon fracture, right trimalleolar ankle fracture  Reproductive/Obstetrics                           Anesthesia Physical Anesthesia Plan  ASA: II  Anesthesia Plan: General   Post-op Pain Management:    Induction: Intravenous  PONV Risk Score and Plan: 4 or greater and Midazolam, Dexamethasone, Ondansetron and Treatment may vary due to age or medical condition  Airway Management Planned: Oral ETT  Additional Equipment:   Intra-op Plan:   Post-operative Plan: Extubation in OR  Informed Consent: I have reviewed the patients History and Physical, chart, labs and discussed the procedure including the risks, benefits and alternatives for the proposed anesthesia with the patient or authorized representative who has indicated his/her understanding and acceptance.     Dental advisory given  Plan Discussed with: CRNA  Anesthesia Plan Comments: (Potential post-op regional anesthesia discussed)       Anesthesia Quick Evaluation

## 2019-07-08 NOTE — OR Nursing (Signed)
I&O CATHETER DONE FOR PT@ 1100 AM  500 MLS  CLEAR URINE RETURNED.Marland Kitchen NO ISSUES NOTED WITH URETHRA CLEANED  PER PROTOCOL.

## 2019-07-08 NOTE — Progress Notes (Signed)
No new issues or problems by report.  Patient unavailable for evaluation today secondary to operative intervention on ankle fracture.

## 2019-07-08 NOTE — Anesthesia Postprocedure Evaluation (Signed)
Anesthesia Post Note  Patient: Ana Freeman  Procedure(s) Performed: IRRIGATION AND DEBRIDEMENT EXTREMITY WITH ADJUSTMENT OF EXTERNAL FIXATOR (Left ) OPEN REDUCTION INTERNAL FIXATION (ORIF) ANKLE FRACTURE (Right )     Patient location during evaluation: PACU Anesthesia Type: General Level of consciousness: awake and alert Pain management: pain level controlled Vital Signs Assessment: post-procedure vital signs reviewed and stable Respiratory status: spontaneous breathing, nonlabored ventilation, respiratory function stable and patient connected to nasal cannula oxygen Cardiovascular status: blood pressure returned to baseline and stable Postop Assessment: no apparent nausea or vomiting Anesthetic complications: no    Last Vitals:  Vitals:   07/08/19 1248 07/08/19 1321  BP: 113/68   Pulse: 72 76  Resp: 12 16  Temp: 36.7 C   SpO2: 100% 99%    Last Pain:  Vitals:   07/08/19 1321  TempSrc:   PainSc: Asleep                 Reyne Falconi P Jamall Strohmeier
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
## 2019-07-08 NOTE — Transfer of Care (Signed)
Immediate Anesthesia Transfer of Care Note  Patient: Ana Freeman  Procedure(s) Performed: IRRIGATION AND DEBRIDEMENT EXTREMITY WITH ADJUSTMENT OF EXTERNAL FIXATOR (Left ) OPEN REDUCTION INTERNAL FIXATION (ORIF) ANKLE FRACTURE (Right )  Patient Location: PACU  Anesthesia Type:General  Level of Consciousness: awake, alert , oriented and patient cooperative  Airway & Oxygen Therapy: Patient Spontanous Breathing  Post-op Assessment: Report given to RN and Post -op Vital signs reviewed and stable  Post vital signs: Reviewed and stable  Last Vitals:  Vitals Value Taken Time  BP 142/83 07/08/19 1119  Temp    Pulse 100 07/08/19 1124  Resp 14 07/08/19 1124  SpO2 100 % 07/08/19 1124  Vitals shown include unvalidated device data.  Last Pain:  Vitals:   07/08/19 0449  TempSrc: Oral  PainSc:       Patients Stated Pain Goal: 2 (15/40/08 6761)  Complications: No apparent anesthesia complications

## 2019-07-09 ENCOUNTER — Encounter (HOSPITAL_COMMUNITY): Payer: Self-pay | Admitting: Orthopaedic Surgery

## 2019-07-09 DIAGNOSIS — S92134A Nondisplaced fracture of posterior process of right talus, initial encounter for closed fracture: Secondary | ICD-10-CM

## 2019-07-09 HISTORY — DX: Nondisplaced fracture of posterior process of right talus, initial encounter for closed fracture: S92.134A

## 2019-07-09 LAB — BASIC METABOLIC PANEL
Anion gap: 6 (ref 5–15)
BUN: 6 mg/dL (ref 6–20)
CO2: 25 mmol/L (ref 22–32)
Calcium: 11.1 mg/dL — ABNORMAL HIGH (ref 8.9–10.3)
Chloride: 105 mmol/L (ref 98–111)
Creatinine, Ser: 1 mg/dL (ref 0.44–1.00)
GFR calc Af Amer: >60 mL/min (ref >60)
GFR calc non Af Amer: >60 mL/min (ref >60)
Glucose, Bld: 105 mg/dL — ABNORMAL HIGH (ref 70–99)
Potassium: 4.2 mmol/L (ref 3.5–5.1)
Sodium: 136 mmol/L (ref 135–145)

## 2019-07-09 LAB — CBC
HCT: 25.3 % — ABNORMAL LOW (ref 36.0–46.0)
Hemoglobin: 7.5 g/dL — ABNORMAL LOW (ref 12.0–15.0)
MCH: 21.1 pg — ABNORMAL LOW (ref 26.0–34.0)
MCHC: 29.6 g/dL — ABNORMAL LOW (ref 30.0–36.0)
MCV: 71.3 fL — ABNORMAL LOW (ref 80.0–100.0)
Platelets: 282 10*3/uL (ref 150–400)
RBC: 3.55 MIL/uL — ABNORMAL LOW (ref 3.87–5.11)
RDW: 18.8 % — ABNORMAL HIGH (ref 11.5–15.5)
WBC: 6.2 10*3/uL (ref 4.0–10.5)
nRBC: 0.3 % — ABNORMAL HIGH (ref 0.0–0.2)

## 2019-07-09 LAB — MAGNESIUM: Magnesium: 1.9 mg/dL (ref 1.7–2.4)

## 2019-07-09 MED ORDER — LIP MEDEX EX OINT
TOPICAL_OINTMENT | CUTANEOUS | Status: DC | PRN
  Filled 2019-07-09: qty 7

## 2019-07-09 MED ORDER — POLYETHYLENE GLYCOL 3350 17 G PO PACK
17.0000 g | PACK | Freq: Every day | ORAL | Status: DC
  Administered 2019-07-09 – 2019-07-11 (×3): 17 g via ORAL
  Filled 2019-07-09 (×4): qty 1

## 2019-07-09 MED ORDER — PIPERACILLIN-TAZOBACTAM 3.375 G IVPB
3.3750 g | Freq: Three times a day (TID) | INTRAVENOUS | Status: AC
  Administered 2019-07-09 – 2019-07-10 (×2): 3.375 g via INTRAVENOUS
  Filled 2019-07-09 (×2): qty 50
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
## 2019-07-09 NOTE — Anesthesia Postprocedure Evaluation (Signed)
Anesthesia Post Note  Patient: Ana Freeman  Procedure(s) Performed: EXTERNAL FIXATION LEFT ANKLE (Left Ankle) IRRIGATION AND DEBRIDEMENT left ankle  (Left Ankle) Closed Reduction Ankle (Right Ankle)     Patient location during evaluation: PACU Anesthesia Type: General Level of consciousness: awake and alert Pain management: pain level controlled Vital Signs Assessment: post-procedure vital signs reviewed and stable Respiratory status: spontaneous breathing, nonlabored ventilation, respiratory function stable and patient connected to nasal cannula oxygen Cardiovascular status: blood pressure returned to baseline and stable Postop Assessment: no apparent nausea or vomiting Anesthetic complications: no    Last Vitals:  Vitals:   07/09/19 0000 07/09/19 0400  BP: 121/64 122/75  Pulse:    Resp:    Temp: 36.7 C 36.7 C  SpO2:      Last Pain:  Vitals:   07/09/19 0634  TempSrc:   PainSc: [place]
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
## 2019-07-09 NOTE — Progress Notes (Signed)
Inpatient Rehab Admissions:  Inpatient Rehab Consult received.  I met with patient at the bedside for rehabilitation assessment and to discuss goals and expectations of an inpatient rehab admission.  Note she is planned for OR tomorrow to address LLE external fixator.  Pt asked that I follow up with her on Wednesday to see if she still needs rehab, which I will do.    Signed: Shann Medal, PT, DPT Admissions Coordinator [phone] 07/09/19  2:01 PM

## 2019-07-10 ENCOUNTER — Encounter (HOSPITAL_COMMUNITY): Payer: Self-pay | Admitting: Student

## 2019-07-10 ENCOUNTER — Inpatient Hospital Stay (HOSPITAL_COMMUNITY): Payer: BC Managed Care – PPO

## 2019-07-10 LAB — CBC
HCT: 24.7 % — ABNORMAL LOW (ref 36.0–46.0)
Hemoglobin: 7.1 g/dL — ABNORMAL LOW (ref 12.0–15.0)
MCH: 20.9 pg — ABNORMAL LOW (ref 26.0–34.0)
MCHC: 28.7 g/dL — ABNORMAL LOW (ref 30.0–36.0)
MCV: 72.9 fL — ABNORMAL LOW (ref 80.0–100.0)
Platelets: 303 10*3/uL (ref 150–400)
RBC: 3.39 MIL/uL — ABNORMAL LOW (ref 3.87–5.11)
RDW: 19 % — ABNORMAL HIGH (ref 11.5–15.5)
WBC: 4.7 10*3/uL (ref 4.0–10.5)
nRBC: 0.6 % — ABNORMAL HIGH (ref 0.0–0.2)

## 2019-07-10 MED ORDER — MAGNESIUM CITRATE PO SOLN
0.5000 | Freq: Once | ORAL | Status: AC
  Administered 2019-07-10: 10:00:00 0.5 via ORAL
  Filled 2019-07-10: qty 296

## 2019-07-10 MED ORDER — METHOCARBAMOL 750 MG PO TABS
750.0000 mg | ORAL_TABLET | Freq: Three times a day (TID) | ORAL | Status: DC
  Administered 2019-07-10 – 2019-07-12 (×8): 750 mg via ORAL
  Filled 2019-07-10 (×8): qty 1

## 2019-07-10 MED ORDER — GABAPENTIN 300 MG PO CAPS
300.0000 mg | ORAL_CAPSULE | Freq: Three times a day (TID) | ORAL | Status: DC
  Administered 2019-07-10 – 2019-07-12 (×8): 300 mg via ORAL
  Filled 2019-07-10 (×8): qty 1
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
## 2019-07-10 NOTE — Progress Notes (Signed)
Orthopaedic Trauma Progress Note  S: Having pain in back. Leg feels okay. No major issues overnight  O:  Vitals:   07/10/19 0323 07/10/19 0409  BP: 130/76   Pulse: 84   Resp: 15   Temp:  98.4 F (36.9 C)  SpO2: 100%     General - Laying in bed, NAD. Pleasant and cooperative  Right Lower Extremity - Short leg splint in place. Non-tender in knee. Excellent knee motion. Able to wiggle toes. Toes warm and well perfused Left lower Extremity - Incisional wound vac removed, laceration stable, no skin wrinkling. Pin sites redressed Difficulty moving toes secondary to swelling. 2+ DP pulse  Imaging: Stable post op imaging.   Labs:  Results for orders placed or performed during the hospital encounter of 07/05/19 (from the past 24 hour(s))  Magnesium     Status: None   Collection Time: 07/09/19 10:01 AM  Result Value Ref Range   Magnesium 1.9 1.7 - 2.4 mg/dL  CBC     Status: Abnormal   Collection Time: 07/09/19 10:01 AM  Result Value Ref Range   WBC 6.2 4.0 - 10.5 K/uL   RBC 3.55 (L) 3.87 - 5.11 MIL/uL   Hemoglobin 7.5 (L) 12.0 - 15.0 g/dL   HCT 25.3 (L) 36.0 - 46.0 %   MCV 71.3 (L) 80.0 - 100.0 fL   MCH 21.1 (L) 26.0 - 34.0 pg   MCHC 29.6 (L) 30.0 - 36.0 g/dL   RDW 18.8 (H) 11.5 - 15.5 %   Platelets 282 150 - 400 K/uL   nRBC 0.3 (H) 0.0 - 0.2 %  Basic metabolic panel     Status: Abnormal   Collection Time: 07/09/19 10:01 AM  Result Value Ref Range   Sodium 136 135 - 145 mmol/L   Potassium 4.2 3.5 - 5.1 mmol/L   Chloride 105 98 - 111 mmol/L   CO2 25 22 - 32 mmol/L   Glucose, Bld 105 (H) 70 - 99 mg/dL   BUN 6 6 - 20 mg/dL   Creatinine, Ser 1.00 0.44 - 1.00 mg/dL   Calcium 11.1 (H) 8.9 - 10.3 mg/dL   GFR calc non Af Amer >60 >60 mL/min   GFR calc Af Amer >60 >60 mL/min   Anion gap 6 5 - 15    Assessment: 48 year old female s/p fall  Injuries: 1. Right trimalleolar ankle fracture s/p ORIF 2. Right lateral talar process fracture s/p closed treatment 3. Left Type IIIA  open pilon fracture s/p closed reduction, I&D x2, placement of external fixator  Weightbearing: NWB BLE  Insicional and dressing care: Dry dressing, will reassess tomorrow  Orthopedic device(s):RLE splint  CV/Blood loss: Acute blood loss anemia, Hgb 7.5 yesterday AM  Pain management:  1. Tylenol 650 mg q 6 hours scheduled 2. Robaxin 500 mg q 6 hours PRN 3. Oxycodone 5-15 mg q 4 hours PRN 4. Neurontin 100 mg TID 5. Dilaudid 1 mg q 2 hours PRN  VTE prophylaxis: Lovenox. Monitor CBC  ID: Zosyn post op x 48 hours to complete today  Foley/Lines: No foley, KVO IVFs  Medical co-morbidities: None  Impediments to Fracture Healing: Polytrauma  Dispo: Too swollen for definitive fixation tomorrow. Likely too swollen for Thursday or Friday. Will reassess tomorrow. Likely will need ORIF sometime next week  Follow - up plan: TBD  Shona Needles, MD [facility] [phone] (phone) [phone] (office) [email or web address]

## 2019-07-10 NOTE — Progress Notes (Signed)
Patient ID: Ana Freeman, female   DOB: 06-Apr-1971, 48 y.o.   MRN: [id number] Patient with L1 burst fracture continue back pain but does not appear to have any radicular symptoms no pain radiating down her legs and L1 or L2 distribution no numbness in her legs except that which is related to her fractures with distal lower extremities.  Strength appears to be 5 out of 5 with limitation on exam secondary to immobilization of lower legs  Follow-up lateral lumbar shows no significant change in loss of height or kyphosis.  Neck step would be an upright lateral lumbar in her brace I will order her a hard clamshell TLSO to give her some additional support to wear when she is sitting.

## 2019-07-10 NOTE — Progress Notes (Signed)
Inpatient Rehab Admissions Coordinator:   Note per ortho note pt with still too much swelling in LLE to fix. Plan for OR later this week or next week.  Will continue to follow for post-op therapy.   Shann Medal, PT, DPT Admissions Coordinator [phone] 07/10/19  9:02 AM
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
## 2019-07-10 NOTE — Progress Notes (Signed)
Orthopedic Tech Progress Note Patient Details:  Ana Freeman 1971-01-16 [id number] Called in order to [facility] for a [facility] TLSO Patient ID: [name], female   DOB: January 22, 1971, 48 y.o.   MRN: [id number]   Janit Pagan 07/10/2019, 12:46 PM

## 2019-07-11 DIAGNOSIS — F430 Acute stress reaction: Secondary | ICD-10-CM

## 2019-07-11 LAB — CBC
HCT: 25 % — ABNORMAL LOW (ref 36.0–46.0)
Hemoglobin: 7.3 g/dL — ABNORMAL LOW (ref 12.0–15.0)
MCH: 20.9 pg — ABNORMAL LOW (ref 26.0–34.0)
MCHC: 29.2 g/dL — ABNORMAL LOW (ref 30.0–36.0)
MCV: 71.6 fL — ABNORMAL LOW (ref 80.0–100.0)
Platelets: 333 10*3/uL (ref 150–400)
RBC: 3.49 MIL/uL — ABNORMAL LOW (ref 3.87–5.11)
RDW: 19.2 % — ABNORMAL HIGH (ref 11.5–15.5)
WBC: 5.5 10*3/uL (ref 4.0–10.5)
nRBC: 0.4 % — ABNORMAL HIGH (ref 0.0–0.2)

## 2019-07-11 MED ORDER — METHOCARBAMOL 750 MG PO TABS: 750.0000 mg | ORAL_TABLET | Freq: Three times a day (TID) | ORAL | 0 refills | Status: DC

## 2019-07-11 MED ORDER — GABAPENTIN 300 MG PO CAPS: 300.0000 mg | ORAL_CAPSULE | Freq: Three times a day (TID) | ORAL | 0 refills | Status: DC

## 2019-07-11 MED ORDER — ENOXAPARIN SODIUM 40 MG/0.4ML ~~LOC~~ SOLN: 40.0000 mg | SUBCUTANEOUS | 0 refills | Status: DC

## 2019-07-11 MED ORDER — BISACODYL 10 MG RE SUPP
10.0000 mg | Freq: Every day | RECTAL | Status: DC | PRN
  Administered 2019-07-12: 10 mg via RECTAL
  Filled 2019-07-11: qty 1

## 2019-07-11 MED ORDER — OXYCODONE HCL 10 MG PO TABS: 10.0000 mg | ORAL_TABLET | ORAL | 0 refills | Status: DC | PRN

## 2019-07-11 NOTE — Discharge Instructions (Addendum)
Orthopaedic Trauma Service Discharge Instructions   General Discharge Instructions  WEIGHT BEARING STATUS: Non-weightbearing on left leg  RANGE OF MOTION/ACTIVITY: Work on knee range of motion  Wound Care: Okay to shower and get external fixator wet. Change dressings around pins daily  DVT/PE prophylaxis: Lovenox  Diet: as you were eating previously.  Can use over the counter stool softeners and bowel preparations, such as Miralax, to help with bowel movements.  Narcotics can be constipating.  Be sure to drink plenty of fluids  PAIN MEDICATION USE AND EXPECTATIONS  You have likely been given narcotic medications to help control your pain.  After a traumatic event that results in an fracture (broken bone) with or without surgery, it is ok to use narcotic pain medications to help control one's pain.  We understand that everyone responds to pain differently and each individual patient will be evaluated on a regular basis for the continued need for narcotic medications. Ideally, narcotic medication use should last no more than 6-8 weeks (coinciding with fracture healing).   As a patient it is your responsibility as well to monitor narcotic medication use and report the amount and frequency you use these medications when you come to your office visit.   We would also advise that if you are using narcotic medications, you should take a dose prior to therapy to maximize you participation.  IF YOU ARE ON NARCOTIC MEDICATIONS IT IS NOT PERMISSIBLE TO OPERATE A MOTOR VEHICLE (MOTORCYCLE/CAR/TRUCK/MOPED) OR HEAVY MACHINERY DO NOT MIX NARCOTICS WITH OTHER CNS (CENTRAL NERVOUS SYSTEM) DEPRESSANTS SUCH AS ALCOHOL   STOP SMOKING OR USING NICOTINE PRODUCTS!!!!  As discussed nicotine severely impairs your body's ability to heal surgical and traumatic wounds but also impairs bone healing.  Wounds and bone heal by forming microscopic blood vessels (angiogenesis) and nicotine is a vasoconstrictor  (essentially, shrinks blood vessels).  Therefore, if vasoconstriction occurs to these microscopic blood vessels they essentially disappear and are unable to deliver necessary nutrients to the healing tissue.  This is one modifiable factor that you can do to dramatically increase your chances of healing your injury.    (This means no smoking, no nicotine gum, patches, etc)  DO NOT USE NONSTEROIDAL ANTI-INFLAMMATORY DRUGS (NSAID'S)  Using products such as Advil (ibuprofen), Aleve (naproxen), Motrin (ibuprofen) for additional pain control during fracture healing can delay and/or prevent the healing response.  If you would like to take over the counter (OTC) medication, Tylenol (acetaminophen) is ok.  However, some narcotic medications that are given for pain control contain acetaminophen as well. Therefore, you should not exceed more than 4000 mg of tylenol in a day if you do not have liver disease.  Also note that there are may OTC medicines, such as cold medicines and allergy medicines that my contain tylenol as well.  If you have any questions about medications and/or interactions please ask your doctor/PA or your pharmacist.      ICE AND ELEVATE INJURED/OPERATIVE EXTREMITY  Using ice and elevating the injured extremity above your heart can help with swelling and pain control.  Icing in a pulsatile fashion, such as 20 minutes on and 20 minutes off, can be followed.    Do not place ice directly on skin. Make sure there is a barrier between to skin and the ice pack.    Using frozen items such as frozen peas works well as the conform nicely to the are that needs to be iced.  USE AN ACE WRAP OR TED HOSE FOR  SWELLING CONTROL  In addition to icing and elevation, Ace wraps or TED hose are used to help limit and resolve swelling.  It is recommended to use Ace wraps or TED hose until you are informed to stop.    When using Ace Wraps start the wrapping distally (farthest away from the body) and wrap proximally  (closer to the body)   Example: If you had surgery on your leg or thing and you do not have a splint on, start the ace wrap at the toes and work your way up to the thigh        If you had surgery on your upper extremity and do not have a splint on, start the ace wrap at your fingers and work your way up to the upper arm   [place]: [phone]   VISIT OUR WEBSITE FOR ADDITIONAL INFORMATION: [email or web address]      Discharge Pin Site Instructions  Dress pins daily with Kerlix roll starting on POD 2. Wrap the Kerlix so that it tamps the skin down around the pin-skin interface to prevent/limit motion of the skin relative to the pin.  (Pin-skin motion is the primary cause of pain and infection related to external fixator pin sites).  Remove any crust or coagulum that may obstruct drainage with a saline moistened gauze or soap and water.  After POD 3, if there is no discernable drainage on the pin site dressing, the interval for change can by increased to every other day.  You may shower with the fixator, cleaning all pin sites gently with soap and water.  If you have a surgical wound this needs to be completely dry and without drainage before showering.  The extremity can be lifted by the fixator to facilitate wound care and transfers.  Notify the office/Doctor if you experience increasing drainage, redness, or pain from a pin site, or if you notice purulent (thick, snot-like) drainage.  You must be in your TLSO brace at all times when not in bed.  If you develop any numbness or tingling in your backside, thighs, etc coming from your back please call the neurosurgeon ASAP  How to Use a Back Brace  A back brace is a form-fitting device that wraps around your trunk to support your lower back, abdomen, and hips. You may need to wear a back brace to relieve back pain or to correct a medical condition related to the back, such as abnormal curvature of the  spine (scoliosis). A back brace can maintain or correct the shape of the spine and prevent a spinal problem from getting worse. A back brace can also take pressure off the layers of tissue (disks) between the bones of the spine (vertebrae). You may need a back brace to keep your back and spine in place while you heal from an injury or recover from surgery. Back braces can be either plastic (rigid brace) or soft elastic (dynamic brace).  A rigid brace usually covers both the front and back of the entire upper body.  A soft brace may cover only the lower back and abdomen and may fasten with self-adhesive elastic straps. Your health care provider will recommend the proper brace for your needs and medical condition. What are the risks?  A back brace may not help if you do not wear it as directed by your health care provider. Be sure to wear the brace exactly as instructed in order to prevent further back problems.  Wearing  the brace may be uncomfortable at first. You may have trouble sleeping with it on. It may also be hard for you to do certain activities while wearing it.  If the back brace is not worn as instructed, it may cause rubbing and pressure on your skin and may cause sores. How to use a back brace Different types of braces will have different instructions for use. Follow instructions from your health care provider about:  How to put on the brace.  When and how often to wear the brace. In some cases, braces may need to be worn for long stretches of time. For example, a brace may need to be worn for 16-23 hours a day when used for scoliosis.  How to take off the brace.  Any safety tips you should follow when wearing the brace. This may include: ? Move carefully while wearing the brace. The brace restricts your movement and could lead to additional injuries. ? Use a cane or walker for support if you feel unsteady. ? Sit in high, firm chairs. It may be difficult to stand up from low,  soft chairs. ? Check the skin around the brace every day. Tell your health care provider about any concerns. How to care for a back brace  Do not let the back brace get wet. Typically, you will remove the brace for bathing and then put it back on afterward.  If you have a rigid brace, be sure to store it in a safe place when you are not wearing it. This will help to prevent damage.  Clean or wash the back brace with mild soap and water as told by your health care provider. Contact a health care provider if:  Your brace gets damaged.  You have pain or discomfort when wearing the back brace.  Your back pain is getting worse or is not improving over time.  You notice redness or skin breakdown from wearing the brace. Summary  You may need to wear a back brace to relieve back pain or to correct a medical condition related to the back, such as abnormal curvature of the spine (scoliosis).  Follow instructions as told by your health care provider about how to use the brace and how to take care of it.  A back brace may not help if you do not wear it as directed by your health care provider. Wear the brace exactly as instructed in order to prevent further back problems.  Move carefully while wearing the brace.  Contact a health care provider if you have pain or discomfort when wearing the back brace or your back pain is getting worse or is not improving over time. This information is not intended to replace advice given to you by your health care provider. Make sure you discuss any questions you have with your health care provider. Document Released: 11/18/2011 Document Revised: 10/25/2018 Document Reviewed: 10/25/2018 Elsevier Patient Education  2020 [employer].
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
## 2019-07-11 NOTE — Progress Notes (Signed)
Patient suffers from L1 fracture, right ankle fracture, open left ankle fracture which impairs their ability to perform daily activities like bathing, dressing, grooming and toileting in the home.  A cane, crutch or walker will not resolve issue with performing activities of daily living. A wheelchair will allow patient to safely perform daily activities. Patient can safely propel the wheelchair in the home or has a caregiver who can provide assistance. Length of need 6 months . Accessories: elevating leg rests (ELRs), drop arm, wheel locks, extensions and anti-tippers.
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
## 2019-07-11 NOTE — Progress Notes (Signed)
Patient suffers from L1 fracture, right ankle fracture, open left ankle fracture which impairs their ability to transfer at home. A hospital bed will aid the patient in safe transfer. Length of need 6 months.

## 2019-07-11 NOTE — Progress Notes (Signed)
Orthopaedic Trauma Progress Note  S: Doing better with therapy. Wants to go home if surgery not this week.  O:  Vitals:   07/11/19 0900 07/11/19 1207  BP: (!) 144/81 (!) 137/113  Pulse: (!) 107 98  Resp: 15 13  Temp: 98.6 F (37 C) 98.3 F (36.8 C)  SpO2: 99% 100%    General - Laying in bed, NAD. Pleasant and cooperative  Right Lower Extremity - Short leg splint in place. Neurovascularly intact Left lower Extremity -laceration stable, mild serosang drainage minimal skin wrinkling. Laceration redressed Difficulty moving toes secondary to swelling. 2+ DP pulse  Imaging: Stable post op imaging.   Labs:  Results for orders placed or performed during the hospital encounter of 07/05/19 (from the past 24 hour(s))  CBC     Status: Abnormal   Collection Time: 07/11/19  4:56 AM  Result Value Ref Range   WBC 5.5 4.0 - 10.5 K/uL   RBC 3.49 (L) 3.87 - 5.11 MIL/uL   Hemoglobin 7.3 (L) 12.0 - 15.0 g/dL   HCT 25.0 (L) 36.0 - 46.0 %   MCV 71.6 (L) 80.0 - 100.0 fL   MCH 20.9 (L) 26.0 - 34.0 pg   MCHC 29.2 (L) 30.0 - 36.0 g/dL   RDW 19.2 (H) 11.5 - 15.5 %   Platelets 333 150 - 400 K/uL   nRBC 0.4 (H) 0.0 - 0.2 %    Assessment: 48 year old female s/p fall  Injuries: 1. Right trimalleolar ankle fracture s/p ORIF 2. Right lateral talar process fracture s/p closed treatment 3. Left Type IIIA open pilon fracture s/p closed reduction, I&D x2, placement of external fixator  Weightbearing: NWB BLE  Insicional and dressing care: Dry dressing PRN  Orthopedic device(s):RLE splint  Will plan to go home and return to see me in office on Tuesday for reevaluation of wound with plans for ORIF lat next week.  CV/Blood loss: Acute blood loss anemia, Hgb 7.3 this AM  Pain management:  Per trauma service  VTE prophylaxis: Lovenox.  ID: [id number] post op x 48 hours-completed  Foley/Lines: No foley, KVO IVFs  Medical co-morbidities: None  Impediments to Fracture Healing: Polytrauma  Dispo: Plan  for discharge home with outpatient follow up with me and definitive fixation late next week  Follow - up plan: Tuesday for wound check in office  Shona Needles, MD [facility] [phone] (phone) [phone] (office) [email or web address]
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
## 2019-07-11 NOTE — Progress Notes (Signed)
Patient ID: Ana Freeman, female   DOB: [birth year], 48 y.o.   MRN: [id number] Patient doing well has been fitted for the brace she overall feels better in the brace and feels like it is giving her more support.  Neurologically stable  Will obtain upright x-ray in place if no additional kyphosis or loss of height patient can be mobilized per Ortho and trauma

## 2019-07-11 NOTE — Progress Notes (Signed)
Inpatient Rehab Admissions Coordinator:   I met with patient at the bedside.  She has progressed very well with therapy and is requiring min guard for transfers.  She still would like to d/c home, versus CIR.  We discussed challenges at home with w/c mobility, transfers to/from [facility], and performing clothing management/hygiene.  We discussed options of CIR>surgery>home, home>surgery>CIR (if weightbearing status changed), and home>surgery>outpatient.  She will have 24/7 assist from her mother and her fiance and initiated conversation regarding her family being allowed to come in for some hands on training with transfers, etc, before she discharged.  I have discussed with CM, and will sign off for CIR at this time.    Shann Medal, PT, DPT Admissions Coordinator [phone] 07/11/19  2:54 PM
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
## 2019-07-11 NOTE — Progress Notes (Signed)
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 I think the plan is home health with wheelchair and 3in1 toilet.

## 2019-07-12 MED ORDER — OXYCODONE HCL 10 MG PO TABS: 10.0000 mg | ORAL_TABLET | ORAL | 0 refills | Status: DC | PRN

## 2019-07-12 MED ORDER — ENOXAPARIN SODIUM 40 MG/0.4ML ~~LOC~~ SOLN: 40.0000 mg | SUBCUTANEOUS | 0 refills | Status: DC

## 2019-07-12 MED ORDER — METHOCARBAMOL 750 MG PO TABS: 750.0000 mg | ORAL_TABLET | Freq: Three times a day (TID) | ORAL | 0 refills | Status: DC

## 2019-07-12 MED ORDER — GABAPENTIN 300 MG PO CAPS: 300.0000 mg | ORAL_CAPSULE | Freq: Three times a day (TID) | ORAL | 0 refills | Status: DC

## 2019-07-12 MED ORDER — ENOXAPARIN (LOVENOX) PATIENT EDUCATION KIT
PACK | Freq: Once | Status: DC
  Filled 2019-07-12: qty 1

## 2019-07-12 MED FILL — METHOCARBAMOL 750 MG TABS: 750 | 10 days supply | Qty: 28 | Fill #0

## 2019-07-12 MED FILL — GABAPENTIN 300 MG CAPSULE: 300 | 7 days supply | Qty: 21 | Fill #0

## 2019-07-12 MED FILL — ENOXAPARIN SODIUM 40 MG/0.4: 40 | 30 days supply | Qty: 12 | Fill #0

## 2019-07-12 MED FILL — oxyCODONE HCL 10 MG TABS: 10 | 7 days supply | Qty: 42 | Fill #0
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
## 2019-07-12 NOTE — Final Progress Note (Signed)
Ana Freeman has discharged to home. I explained all discharge information to her and her fiance. Answered all questions.
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
## 2019-07-13 ENCOUNTER — Ambulatory Visit: Payer: Self-pay | Admitting: Student

## 2019-07-13 ENCOUNTER — Encounter: Payer: Self-pay | Admitting: Cardiovascular Disease

## 2019-07-13 DIAGNOSIS — S82872C Displaced pilon fracture of left tibia, initial encounter for open fracture type IIIA, IIIB, or IIIC: Secondary | ICD-10-CM

## 2019-07-17 ENCOUNTER — Other Ambulatory Visit: Payer: Self-pay

## 2019-07-17 ENCOUNTER — Encounter (HOSPITAL_COMMUNITY): Payer: Self-pay | Admitting: *Deleted

## 2019-07-17 ENCOUNTER — Other Ambulatory Visit (HOSPITAL_COMMUNITY)
Admission: RE | Admit: 2019-07-17 | Discharge: 2019-07-17 | Disposition: A | Discharge status: Home / Self Care | Payer: BC Managed Care – PPO | Source: Ambulatory Visit | Attending: Student | Admitting: Student

## 2019-07-17 DIAGNOSIS — Z01812 Encounter for preprocedural laboratory examination: Secondary | ICD-10-CM | POA: Diagnosis not present

## 2019-07-17 DIAGNOSIS — Z20828 Contact with and (suspected) exposure to other viral communicable diseases: Secondary | ICD-10-CM | POA: Insufficient documentation

## 2019-07-17 LAB — SARS CORONAVIRUS 2 (TAT 6-24 HRS): SARS Coronavirus 2: NEGATIVE

## 2019-07-17 NOTE — Progress Notes (Addendum)
I have not been able to reach Ms Golonka.  I see tha there Hemoglobin was 7.3 at discharge-  It was in the 7's the entire hospitalization.  I reviewed CBC in Care Everywhere- 2018 Hemo in the  12's, 1017 hemoglobin 9.'s. Patient's PCP is Dr Precious Haws, she is in the [place] system. I do not see that patient has seen PCP since she was discharged. I called Dr Tama Headings office, I spoke with Rebekah Chesterfield, I gave he rthe  Hemoglobin at discharge 07/11/2019 and the Hemoglobins from [facility] from 2017 and 2018.  Gywn said she will send Dr Doreatha Martin a message. Ms Andrzejewski denies chest pain or shortness of breath. Ms Tessmer was tested for Covid 19 today and is in quarantine with people she lives with.  I spoke with Ms Tolle, she said that she used to be anemic and she took a vitamin with Iron, but does not take it now. Ms Pepitone states that she  Has not seen PCP since discharge from hospital 07/11/2019. I instructed patient to not take any more vitamins.  Ms Blick reports that last dose of Lovenox was this am- 07/17/2019. I instructed patient to not eat after midnight Wednesday. I instructed patient that she could have clear liquids until 4:30, we reviewed what clear liquids consist of.  I asked if patient could have someone come by the hospital and pick up the Ensure- Pre- Surgery, patient said someone would come get it tomorrow.  Patient will have someone call before they arrive to the hospital so PAT staff member can take the beverage out.  I will  type instruction and put them with the beverage. I asked anesthesiology PA-C to review chart- in regards to Hemoglobin.

## 2019-07-17 NOTE — H&P (Signed)
Orthopaedic Trauma Service (OTS) H&P   Patient ID: KELLY RANIERI MRN: [id number] DOB/AGE: [birth year] 47 y.o.  Reason for Surgery: Left open pilon fracture  HPI: Dailin L Odonnell is an 48 y.o. female presenting for definitive fixation of left open pilon fracture. Patient had a fall from a third story balcony which resulted in left open ankle fracture. Patient underwent irrigation & debridement x 2 and placement of external fixator to the leg. While patient was in hospital, soft tissue swelling over the ankle did not improve enough to safely proceed with open reduction internal fixation. Patient was discharged on 07/11/19 with the external fixator in place and instructed to follow up in OTS clinic for re-evaluation of soft tissue.  Patient seen by myself and Dr. Doreatha Martin on 07/17/2019 in outpatient clinic. Patient presents with fiance. At time of initial injury, she also sustained right ankle fracture which has been surgically repaired and placed in a short leg splint post-operatively. She has been non-weightbearing on bilateral lower extremities. Remains on Lovenox for DVT prophylaxisHer swelling in her left leg has improved to an appropriate level to now safely proceed with definitive fixation. She has some pins and needles feelings in the foot, currently on Gabapentin. Otherwise, pain has been fairly well controlled over the past several days. Has kept ace wrap in place over ankle to help with swelling. Has had minimal drainage from pin sites.   Past Medical History:  Diagnosis Date  . Anemia   . GERD (gastroesophageal reflux disease)   . Headache   . Hyperlipidemia 07/19/2018  . Numbness of lower extremity   . Parathyroid abnormality [facility])     Past Surgical History:  Procedure Laterality Date  . ANKLE CLOSED REDUCTION Right 07/05/2019   Procedure: Closed Reduction Ankle;  Surgeon: Hiram Gash, MD;  Location: [place];  Service: Orthopedics;  Laterality: Right;  . DILATION AND EVACUATION N/A  02/12/2016   Procedure: DILATATION AND EVACUATION WITH SUCTION ;  Surgeon: Eldred Manges, MD;  Location: [place] [facility];  Service: Gynecology;  Laterality: N/A;  . EXTERNAL FIXATION LEG Left 07/05/2019   Procedure: EXTERNAL FIXATION LEFT ANKLE;  Surgeon: Hiram Gash, MD;  Location: [place];  Service: Orthopedics;  Laterality: Left;  . HYSTEROSCOPY WITH RESECTOSCOPE N/A 02/12/2016   Procedure: HYSTEROSCOPIC MYOMECTOMY WITH RESECTOSCOPE;  Surgeon: Eldred Manges, MD;  Location: [facility] [facility];  Service: Gynecology;  Laterality: N/A;  . I&D EXTREMITY Left 07/05/2019   Procedure: IRRIGATION AND DEBRIDEMENT left ankle ;  Surgeon: Hiram Gash, MD;  Location: [place];  Service: Orthopedics;  Laterality: Left;  . I&D EXTREMITY Left 07/08/2019   Procedure: IRRIGATION AND DEBRIDEMENT EXTREMITY WITH ADJUSTMENT OF EXTERNAL FIXATOR;  Surgeon: Shona Needles, MD;  Location: [place];  Service: Orthopedics;  Laterality: Left;  Marland Kitchen MASS EXCISION Right 08/30/2017   Procedure: EXCISION RIGHT UPPER ARM MASS;  Surgeon: Coralie Keens, MD;  Location: [place];  Service: General;  Laterality: Right;  . MYOMECTOMY N/A 02/12/2016   Procedure: ABDOMINAL MYOMECTOMY;  Surgeon: Eldred Manges, MD;  Location: [place] [facility];  Service: Gynecology;  Laterality: N/A;  . ORIF ANKLE FRACTURE Right 07/08/2019   Procedure: OPEN REDUCTION INTERNAL FIXATION (ORIF) ANKLE FRACTURE;  Surgeon: Shona Needles, MD;  Location: [place];  Service: Orthopedics;  Laterality: Right;  . UPPER GI ENDOSCOPY    . WISDOM TOOTH EXTRACTION      Family History  Problem Relation Age of Onset  . Hypertension Mother   . Diabetes  Father   . Congenital heart disease Father   . Anemia Sister   . Lung cancer Maternal Grandmother   . Ovarian cancer Paternal Grandmother   . Alzheimer's disease Paternal Grandfather     Social History:  reports that she has never smoked. She has never used smokeless tobacco. She reports current alcohol use. She reports that she  does not use drugs.  Allergies: Not on File  Medications: I have reviewed the patient's current medications.  ROS: Constitutional: No fever or chills Vision: No changes in vision ENT: No difficulty swallowing CV: No chest pain Pulm: No SOB or wheezing GI: No nausea or vomiting GU: No urgency or inability to hold urine Skin: No poor wound healing Neurologic: No numbness or tingling Psychiatric: No depression or anxiety Heme: No bruising Allergic: No reaction to medications or food   Exam: Last menstrual period 07/09/2019. General: Sitting up in wheelchair, NAD. TLSO brace in place. Orientation: Alert and oriented x 3 Mood and Affect: Mood and affect appropriate. Pleasant and cooperative Gait: Not assessed Coordination and balance: Within normal limits  Left Lower Extremity: Ex-fix in place. Minimal drainage from pin sites. Ace wrap removed and skin wrinkles appropriately over anticipated area for surgical incision. Traumatic laceration over anterior ankle healing with minimal drainage. Wound edges due not appear necrotic. Tenderness about the ankle and foot. Swelling in foot and toes to be expected. Able to wiggle toes some. Compartments of lower leg soft and compressible. Sensation intact over dorsal surface of foot, slightly diminished to light touch of plantar surface but fully intact to pinch of the skin. 2+ DP pulse.   Right Lower Extremity: Short leg splint in place, dressing remains clean, dry, intact. Non-tender in the knee. Full knee ROM. Toes warm and well perfused. Able to wiggle toes.   Medical Decision Making: Imaging: AP and lateral views of left ankle s/p placement of external fixator shows comminuted distal fibular and tibial fractures with screws in the 1st and 5th metatarsals and mid-hindfoot.  Labs: No results found for this or any previous visit (from the past 24 hour(s)).  Medical history and chart was reviewed  Assessment/Plan: 48 year old female s/p fall  from balcony resulting in left open pilon fracture   Patient has had enough improvement in soft tissue swelling over the ankle to proceed with definitive fixation of the fracture. Would recommend proceeding with removal of external fixator and subsequent open reduction internal fixation of left ankle. Surgery planned for Thursday morning. Will plan to remove splint from right leg while in the operating room and transition to walking boot.  Will obtain follow-up x-rays of the right ankle while patient is in the hospital. Patient will likely be discharged home following the procedure. She will remain non-weightbearing on left leg for at least another 4 weeks.  Risks and benefits of surgery were dicussed with the patient, her fiance, and her sister (via telephone). Risks discussed included bleeding requiring blood transfusion, bleeding causing a hematoma, infection, malunion, nonunion, damage to surrounding nerves and blood vessels, pain, hardware prominence or irritation, hardware failure, stiffness, post-traumatic arthritis, DVT/PE, compartment syndrome, and anesthesia complications, even including  Death. All questions were answered to the patient's satisfaction. She agrees to proceed with surgery, consent obtained.     Aryav Wimberly A. [facility] ?[phone]? (phone)

## 2019-07-17 NOTE — Pre-Procedure Instructions (Signed)
    Ana Freeman  07/17/2019      Your procedure is scheduled on Friday, August 7..  Report to [facility], Main Entrance or Entrance "A" at 5:30 AM A.M.  Call this number if you have problems the morning of surgery: [phone]  This is the number for the Pre- Surgical Desk.     Remember:  Do not eat after midnight.  You may drink clear liquids until 4:30 AM .  Clear liquids allowed are:  Carbonated Beverages, Water, Clear Tea, Black Coffee only, Juice (non-citric and without pulp), Gatorade, Plain Jell-O  only, Plain Popsicles only. Drink Pre- Surgery Ensure between 4:00AM and 4:30 AM               Take these medicines the morning of surgery before 4:30: Gabapentin, Pantoprazole, Methocarbamol. If needed Oxycodone.  Do not drink anything after 4:30 AM              Wash with antibacteria soap, wear clean clothes, Brush teeth.  Do not wear jewelry, make-up or nail polish.  Do not wear lotions, powders, or perfumes, or deodorant.             Do Not bring valuables with you to the hospital, the hospital is not responsible.   Wishing you the Best! Jan

## 2019-07-18 NOTE — Anesthesia Preprocedure Evaluation (Addendum)
Anesthesia Evaluation  Patient identified by MRN, date of birth, ID band Patient awake    Reviewed: Allergy & Precautions, NPO status , Patient's Chart, lab work & pertinent test results  Airway Mallampati: II  TM Distance: >3 FB Neck ROM: Full    Dental no notable dental hx. (+) Teeth Intact   Pulmonary    Pulmonary exam normal breath sounds clear to auscultation       Cardiovascular Exercise Tolerance: Good negative cardio ROS Normal cardiovascular exam Rhythm:Regular Rate:Normal     Neuro/Psych  Headaches, Anxiety    GI/Hepatic Neg liver ROS, GERD  ,  Endo/Other  negative endocrine ROS  Renal/GU negative Renal ROS     Musculoskeletal   Abdominal   Peds  Hematology  (+) Blood dyscrasia, anemia ,   Anesthesia Other Findings   Reproductive/Obstetrics Pt denies nay possibility she could be pregnant and refuses test                             Anesthesia Physical Anesthesia Plan  ASA: I  Anesthesia Plan: General   Post-op Pain Management: GA combined w/ Regional for post-op pain   Induction: Intravenous  PONV Risk Score and Plan: 4 or greater and Treatment may vary due to age or medical condition, Ondansetron, Dexamethasone, Midazolam and Scopolamine patch - Pre-op  Airway Management Planned: Oral ETT  Additional Equipment:   Intra-op Plan:   Post-operative Plan: Extubation in OR  Informed Consent:     Dental advisory given  Plan Discussed with:   Anesthesia Plan Comments:        Anesthesia Quick Evaluation

## 2019-07-19 ENCOUNTER — Observation Stay (HOSPITAL_COMMUNITY)
Admission: RE | Admit: 2019-07-19 | Discharge: 2019-07-21 | Disposition: A | Discharge status: Home / Self Care | Payer: BC Managed Care – PPO | Attending: Student | Admitting: Student

## 2019-07-19 ENCOUNTER — Encounter (HOSPITAL_COMMUNITY)
Admission: RE | Disposition: A | Discharge status: Home / Self Care | Payer: Self-pay | Source: Home / Self Care | Attending: Student

## 2019-07-19 ENCOUNTER — Encounter (HOSPITAL_COMMUNITY): Payer: Self-pay

## 2019-07-19 ENCOUNTER — Other Ambulatory Visit: Payer: Self-pay

## 2019-07-19 ENCOUNTER — Ambulatory Visit (HOSPITAL_COMMUNITY): Payer: BC Managed Care – PPO | Admitting: Physician Assistant

## 2019-07-19 ENCOUNTER — Observation Stay (HOSPITAL_COMMUNITY): Payer: BC Managed Care – PPO

## 2019-07-19 ENCOUNTER — Ambulatory Visit (HOSPITAL_COMMUNITY): Payer: BC Managed Care – PPO

## 2019-07-19 DIAGNOSIS — K219 Gastro-esophageal reflux disease without esophagitis: Secondary | ICD-10-CM | POA: Diagnosis not present

## 2019-07-19 DIAGNOSIS — S82851D Displaced trimalleolar fracture of right lower leg, subsequent encounter for closed fracture with routine healing: Secondary | ICD-10-CM | POA: Insufficient documentation

## 2019-07-19 DIAGNOSIS — W1789XA Other fall from one level to another, initial encounter: Secondary | ICD-10-CM | POA: Insufficient documentation

## 2019-07-19 DIAGNOSIS — S82872A Displaced pilon fracture of left tibia, initial encounter for closed fracture: Secondary | ICD-10-CM | POA: Diagnosis not present

## 2019-07-19 DIAGNOSIS — Z419 Encounter for procedure for purposes other than remedying health state, unspecified: Secondary | ICD-10-CM

## 2019-07-19 DIAGNOSIS — T148XXA Other injury of unspecified body region, initial encounter: Secondary | ICD-10-CM

## 2019-07-19 DIAGNOSIS — D62 Acute posthemorrhagic anemia: Secondary | ICD-10-CM | POA: Insufficient documentation

## 2019-07-19 DIAGNOSIS — S82872C Displaced pilon fracture of left tibia, initial encounter for open fracture type IIIA, IIIB, or IIIC: Secondary | ICD-10-CM

## 2019-07-19 HISTORY — PX: EXTERNAL FIXATION REMOVAL: SHX5040

## 2019-07-19 HISTORY — PX: OPEN REDUCTION INTERNAL FIXATION (ORIF) TIBIA/FIBULA FRACTURE: SHX5992

## 2019-07-19 LAB — CBC
HCT: 26.9 % — ABNORMAL LOW (ref 36.0–46.0)
Hemoglobin: 7.5 g/dL — ABNORMAL LOW (ref 12.0–15.0)
MCH: 20.8 pg — ABNORMAL LOW (ref 26.0–34.0)
MCHC: 27.9 g/dL — ABNORMAL LOW (ref 30.0–36.0)
MCV: 74.5 fL — ABNORMAL LOW (ref 80.0–100.0)
Platelets: 627 10*3/uL — ABNORMAL HIGH (ref 150–400)
RBC: 3.61 MIL/uL — ABNORMAL LOW (ref 3.87–5.11)
RDW: 18.8 % — ABNORMAL HIGH (ref 11.5–15.5)
WBC: 11.3 10*3/uL — ABNORMAL HIGH (ref 4.0–10.5)
nRBC: 0 % (ref 0.0–0.2)

## 2019-07-19 LAB — CREATININE, SERUM
Creatinine, Ser: 0.91 mg/dL (ref 0.44–1.00)
GFR calc Af Amer: >60 mL/min (ref >60)
GFR calc non Af Amer: >60 mL/min (ref >60)

## 2019-07-19 SURGERY — OPEN REDUCTION INTERNAL FIXATION (ORIF) TIBIA/FIBULA FRACTURE
Anesthesia: General | Site: Ankle | Laterality: Left

## 2019-07-19 MED ORDER — GABAPENTIN 300 MG PO CAPS
300.0000 mg | ORAL_CAPSULE | Freq: Three times a day (TID) | ORAL | Status: DC
  Administered 2019-07-19 – 2019-07-21 (×6): 300 mg via ORAL
  Filled 2019-07-19 (×6): qty 1

## 2019-07-19 MED ORDER — MIDAZOLAM HCL 2 MG/2ML IJ SOLN
INTRAMUSCULAR | Status: DC | PRN
  Administered 2019-07-19: 2 mg via INTRAVENOUS

## 2019-07-19 MED ORDER — FENTANYL CITRATE (PF) 250 MCG/5ML IJ SOLN
INTRAMUSCULAR | Status: DC | PRN
  Administered 2019-07-19: 100 ug via INTRAVENOUS
  Administered 2019-07-19: 50 ug via INTRAVENOUS

## 2019-07-19 MED ORDER — CEFAZOLIN SODIUM-DEXTROSE 2-4 GM/100ML-% IV SOLN
2.0000 g | Freq: Three times a day (TID) | INTRAVENOUS | Status: AC
  Administered 2019-07-19 – 2019-07-20 (×3): 2 g via INTRAVENOUS
  Filled 2019-07-19 (×3): qty 100

## 2019-07-19 MED ORDER — SCOPOLAMINE 1 MG/3DAYS TD PT72
MEDICATED_PATCH | TRANSDERMAL | Status: DC | PRN
  Administered 2019-07-19: 1 via TRANSDERMAL

## 2019-07-19 MED ORDER — PROPOFOL 10 MG/ML IV BOLUS
INTRAVENOUS | Status: AC
  Filled 2019-07-19: qty 20

## 2019-07-19 MED ORDER — CEFAZOLIN SODIUM-DEXTROSE 2-4 GM/100ML-% IV SOLN
2.0000 g | INTRAVENOUS | Status: AC
  Administered 2019-07-19: 2 g via INTRAVENOUS
  Filled 2019-07-19: qty 100

## 2019-07-19 MED ORDER — PHENYLEPHRINE 40 MCG/ML (10ML) SYRINGE FOR IV PUSH (FOR BLOOD PRESSURE SUPPORT)
PREFILLED_SYRINGE | INTRAVENOUS | Status: DC | PRN
  Administered 2019-07-19 (×3): 80 ug via INTRAVENOUS

## 2019-07-19 MED ORDER — HYDROMORPHONE HCL 1 MG/ML IJ SOLN
INTRAMUSCULAR | Status: AC
  Filled 2019-07-19: qty 1

## 2019-07-19 MED ORDER — FENTANYL CITRATE (PF) 250 MCG/5ML IJ SOLN
INTRAMUSCULAR | Status: AC
  Filled 2019-07-19: qty 5

## 2019-07-19 MED ORDER — METOCLOPRAMIDE HCL 5 MG/ML IJ SOLN: 5.0000 mg | Freq: Three times a day (TID) | INTRAMUSCULAR | Status: DC | PRN

## 2019-07-19 MED ORDER — MEPERIDINE HCL 25 MG/ML IJ SOLN: 6.2500 mg | INTRAMUSCULAR | Status: DC | PRN

## 2019-07-19 MED ORDER — ONDANSETRON HCL 4 MG PO TABS: 4.0000 mg | ORAL_TABLET | Freq: Four times a day (QID) | ORAL | Status: DC | PRN

## 2019-07-19 MED ORDER — ACETAMINOPHEN 10 MG/ML IV SOLN
INTRAVENOUS | Status: AC
  Filled 2019-07-19: qty 100

## 2019-07-19 MED ORDER — OXYCODONE HCL 5 MG PO TABS
10.0000 mg | ORAL_TABLET | ORAL | Status: DC | PRN
  Administered 2019-07-20: 15 mg via ORAL
  Administered 2019-07-20: 16:00:00 10 mg via ORAL
  Administered 2019-07-20 – 2019-07-21 (×2): 15 mg via ORAL
  Filled 2019-07-19 (×3): qty 3

## 2019-07-19 MED ORDER — ACETAMINOPHEN 10 MG/ML IV SOLN
1000.0000 mg | Freq: Once | INTRAVENOUS | Status: DC | PRN
  Administered 2019-07-19: 1000 mg via INTRAVENOUS

## 2019-07-19 MED ORDER — HYDROMORPHONE HCL 1 MG/ML IJ SOLN
0.2500 mg | INTRAMUSCULAR | Status: DC | PRN
  Administered 2019-07-19 (×2): 0.5 mg via INTRAVENOUS

## 2019-07-19 MED ORDER — POTASSIUM CHLORIDE IN NACL 20-0.9 MEQ/L-% IV SOLN
INTRAVENOUS | Status: DC
  Administered 2019-07-19 – 2019-07-20 (×2): via INTRAVENOUS
  Filled 2019-07-19 (×2): qty 1000

## 2019-07-19 MED ORDER — ONDANSETRON HCL 4 MG/2ML IJ SOLN
INTRAMUSCULAR | Status: DC | PRN
  Administered 2019-07-19: 4 mg via INTRAVENOUS

## 2019-07-19 MED ORDER — VANCOMYCIN HCL 1000 MG IV SOLR
INTRAVENOUS | Status: DC | PRN
  Administered 2019-07-19: 1000 mg

## 2019-07-19 MED ORDER — LACTATED RINGERS IV SOLN
INTRAVENOUS | Status: DC | PRN
  Administered 2019-07-19 (×2): via INTRAVENOUS

## 2019-07-19 MED ORDER — 0.9 % SODIUM CHLORIDE (POUR BTL) OPTIME
TOPICAL | Status: DC | PRN
  Administered 2019-07-19: 1000 mL

## 2019-07-19 MED ORDER — CEFAZOLIN SODIUM-DEXTROSE 2-4 GM/100ML-% IV SOLN: 2.0000 g | Freq: Four times a day (QID) | INTRAVENOUS | Status: DC

## 2019-07-19 MED ORDER — METOCLOPRAMIDE HCL 5 MG PO TABS: 5.0000 mg | ORAL_TABLET | Freq: Three times a day (TID) | ORAL | Status: DC | PRN

## 2019-07-19 MED ORDER — DEXAMETHASONE SODIUM PHOSPHATE 10 MG/ML IJ SOLN
INTRAMUSCULAR | Status: DC | PRN
  Administered 2019-07-19: 10 mg via INTRAVENOUS

## 2019-07-19 MED ORDER — ROPIVACAINE HCL 5 MG/ML IJ SOLN
INTRAMUSCULAR | Status: DC | PRN
  Administered 2019-07-19: 10 mL via PERINEURAL
  Administered 2019-07-19: 30 mL via PERINEURAL

## 2019-07-19 MED ORDER — ACETAMINOPHEN 500 MG PO TABS
1000.0000 mg | ORAL_TABLET | Freq: Four times a day (QID) | ORAL | Status: DC
  Administered 2019-07-19 – 2019-07-21 (×8): 1000 mg via ORAL
  Filled 2019-07-19 (×8): qty 2

## 2019-07-19 MED ORDER — DEXAMETHASONE SODIUM PHOSPHATE 10 MG/ML IJ SOLN
INTRAMUSCULAR | Status: AC
  Filled 2019-07-19: qty 1

## 2019-07-19 MED ORDER — TOBRAMYCIN SULFATE 1.2 G IJ SOLR
INTRAMUSCULAR | Status: AC
  Filled 2019-07-19: qty 1.2

## 2019-07-19 MED ORDER — MIDAZOLAM HCL 2 MG/2ML IJ SOLN
INTRAMUSCULAR | Status: AC
  Filled 2019-07-19: qty 2

## 2019-07-19 MED ORDER — TOBRAMYCIN SULFATE 1.2 G IJ SOLR
INTRAMUSCULAR | Status: DC | PRN
  Administered 2019-07-19: 1.2 g via TOPICAL

## 2019-07-19 MED ORDER — ONDANSETRON HCL 4 MG/2ML IJ SOLN: 4.0000 mg | Freq: Four times a day (QID) | INTRAMUSCULAR | Status: DC | PRN

## 2019-07-19 MED ORDER — HYDROCODONE-ACETAMINOPHEN 7.5-325 MG PO TABS: 1.0000 | ORAL_TABLET | Freq: Once | ORAL | Status: DC | PRN

## 2019-07-19 MED ORDER — VANCOMYCIN HCL 1000 MG IV SOLR
INTRAVENOUS | Status: AC
  Filled 2019-07-19: qty 1000

## 2019-07-19 MED ORDER — ONDANSETRON HCL 4 MG/2ML IJ SOLN: 4.0000 mg | Freq: Once | INTRAMUSCULAR | Status: DC | PRN

## 2019-07-19 MED ORDER — OXYCODONE HCL 5 MG PO TABS
5.0000 mg | ORAL_TABLET | ORAL | Status: DC | PRN
  Administered 2019-07-19: 5 mg via ORAL
  Administered 2019-07-20: 03:00:00 10 mg via ORAL
  Filled 2019-07-19: qty 2
  Filled 2019-07-19: qty 1
  Filled 2019-07-19: qty 2

## 2019-07-19 MED ORDER — ENOXAPARIN SODIUM 40 MG/0.4ML ~~LOC~~ SOLN
40.0000 mg | SUBCUTANEOUS | Status: DC
  Administered 2019-07-20: 40 mg via SUBCUTANEOUS
  Filled 2019-07-19: qty 0.4

## 2019-07-19 MED ORDER — PROPOFOL 10 MG/ML IV BOLUS
INTRAVENOUS | Status: DC | PRN
  Administered 2019-07-19: 180 mg via INTRAVENOUS

## 2019-07-19 MED ORDER — ONDANSETRON HCL 4 MG/2ML IJ SOLN
INTRAMUSCULAR | Status: AC
  Filled 2019-07-19: qty 2

## 2019-07-19 MED ORDER — DOCUSATE SODIUM 100 MG PO CAPS
100.0000 mg | ORAL_CAPSULE | Freq: Two times a day (BID) | ORAL | Status: DC
  Administered 2019-07-19 – 2019-07-21 (×4): 100 mg via ORAL
  Filled 2019-07-19 (×4): qty 1

## 2019-07-19 MED ORDER — METHOCARBAMOL 1000 MG/10ML IJ SOLN
500.0000 mg | Freq: Four times a day (QID) | INTRAVENOUS | Status: DC | PRN
  Filled 2019-07-19: qty 5

## 2019-07-19 MED ORDER — METHOCARBAMOL 500 MG PO TABS
500.0000 mg | ORAL_TABLET | Freq: Four times a day (QID) | ORAL | Status: DC | PRN
  Administered 2019-07-19 – 2019-07-21 (×4): 500 mg via ORAL
  Filled 2019-07-19 (×4): qty 1

## 2019-07-19 MED ORDER — LIDOCAINE 2% (20 MG/ML) 5 ML SYRINGE
INTRAMUSCULAR | Status: DC | PRN
  Administered 2019-07-19: 100 mg via INTRAVENOUS

## 2019-07-19 MED ORDER — SODIUM CHLORIDE 0.9 % IV SOLN
INTRAVENOUS | Status: DC | PRN
  Administered 2019-07-19: 25 ug/min via INTRAVENOUS

## 2019-07-19 MED ORDER — HYDROMORPHONE HCL 1 MG/ML IJ SOLN
1.0000 mg | INTRAMUSCULAR | Status: DC | PRN
  Administered 2019-07-20 (×4): 1 mg via INTRAVENOUS
  Filled 2019-07-19 (×5): qty 1

## 2019-07-19 MED ORDER — LIDOCAINE 2% (20 MG/ML) 5 ML SYRINGE
INTRAMUSCULAR | Status: AC
  Filled 2019-07-19: qty 5

## 2019-07-19 MED ORDER — CHLORHEXIDINE GLUCONATE 4 % EX LIQD: 60.0000 mL | Freq: Once | CUTANEOUS | Status: DC

## 2019-07-19 SURGICAL SUPPLY — 61 items
APL PRP STRL LF DISP 70% ISPRP (MISCELLANEOUS) ×1
BIT DRILL CALIBRATED 2.7 (BIT) ×1 IMPLANT
BIT DRILL CALIBRATED 2.7MM (BIT) ×1
BNDG CMPR MED 10X6 ELC LF (GAUZE/BANDAGES/DRESSINGS) ×1
BNDG ELASTIC 4X5.8 VLCR STR LF (GAUZE/BANDAGES/DRESSINGS) ×3 IMPLANT
BNDG ELASTIC 6X10 VLCR STRL LF (GAUZE/BANDAGES/DRESSINGS) ×2 IMPLANT
BNDG ELASTIC 6X5.8 VLCR STR LF (GAUZE/BANDAGES/DRESSINGS) ×3 IMPLANT
BNDG GAUZE ELAST 4 BULKY (GAUZE/BANDAGES/DRESSINGS) ×6 IMPLANT
BRUSH SCRUB EZ PLAIN DRY (MISCELLANEOUS) ×6 IMPLANT
CHLORAPREP W/TINT 26 (MISCELLANEOUS) ×3 IMPLANT
COVER SURGICAL LIGHT HANDLE (MISCELLANEOUS) ×6 IMPLANT
COVER WAND RF STERILE (DRAPES) ×3 IMPLANT
DRAPE C-ARM 42X72 X-RAY (DRAPES) IMPLANT
DRAPE C-ARMOR (DRAPES) IMPLANT
DRAPE U-SHAPE 47X51 STRL (DRAPES) ×3 IMPLANT
DRSG ADAPTIC 3X8 NADH LF (GAUZE/BANDAGES/DRESSINGS) ×3 IMPLANT
ELECT REM PT RETURN 9FT ADLT (ELECTROSURGICAL) ×3
ELECTRODE REM PT RTRN 9FT ADLT (ELECTROSURGICAL) ×1 IMPLANT
GAUZE SPONGE 4X4 12PLY STRL (GAUZE/BANDAGES/DRESSINGS) ×3 IMPLANT
GLOVE BIO SURGEON STRL SZ 6.5 (GLOVE) ×6 IMPLANT
GLOVE BIO SURGEON STRL SZ7.5 (GLOVE) ×12 IMPLANT
GLOVE BIO SURGEONS STRL SZ 6.5 (GLOVE) ×3
GLOVE BIOGEL PI IND STRL 6.5 (GLOVE) ×1 IMPLANT
GLOVE BIOGEL PI IND STRL 7.5 (GLOVE) ×1 IMPLANT
GLOVE BIOGEL PI INDICATOR 6.5 (GLOVE) ×2
GLOVE BIOGEL PI INDICATOR 7.5 (GLOVE) ×2
GOWN STRL REUS W/ TWL LRG LVL3 (GOWN DISPOSABLE) ×2 IMPLANT
GOWN STRL REUS W/TWL LRG LVL3 (GOWN DISPOSABLE) ×6
K-WIRE ACE 1.6X6 (WIRE) ×12
KIT BASIN OR (CUSTOM PROCEDURE TRAY) ×3 IMPLANT
KIT TURNOVER KIT B (KITS) ×3 IMPLANT
KWIRE ACE 1.6X6 (WIRE) IMPLANT
MANIFOLD NEPTUNE II (INSTRUMENTS) ×3 IMPLANT
NS IRRIG 1000ML POUR BTL (IV SOLUTION) ×3 IMPLANT
PACK TOTAL JOINT (CUSTOM PROCEDURE TRAY) ×3 IMPLANT
PAD ARMBOARD 7.5X6 YLW CONV (MISCELLANEOUS) ×6 IMPLANT
PADDING CAST COTTON 6X4 STRL (CAST SUPPLIES) ×7 IMPLANT
PLATE 9H LT DIST ANTLAT TIB (Plate) ×3 IMPLANT
PLATE ANTLAT CNTR 156X9 (Plate) IMPLANT
SCREW CORTICAL 3.5MM  28MM (Screw) ×2 IMPLANT
SCREW CORTICAL 3.5MM  30MM (Screw) ×2 IMPLANT
SCREW CORTICAL 3.5MM 26MM (Screw) ×4 IMPLANT
SCREW CORTICAL 3.5MM 28MM (Screw) IMPLANT
SCREW CORTICAL 3.5MM 30MM (Screw) IMPLANT
SCREW T15 LP CORT 3.5X40MM NS (Screw) ×2 IMPLANT
SCREW T15 MD 3.5X30MM NS (Screw) ×2 IMPLANT
SCREW T15 MD 3.5X34MM NS (Screw) ×2 IMPLANT
SCREW T15 MD 3.5X36MM NS (Screw) ×4 IMPLANT
SPONGE LAP 18X18 RF (DISPOSABLE) ×3 IMPLANT
STAPLER VISISTAT 35W (STAPLE) IMPLANT
SUT ETHILON 3 0 FSL (SUTURE) ×2 IMPLANT
SUT MNCRL AB 3-0 PS2 18 (SUTURE) ×3 IMPLANT
SUT MON AB 2-0 CT1 36 (SUTURE) ×3 IMPLANT
SUT VIC AB 0 CT1 27 (SUTURE) ×3
SUT VIC AB 0 CT1 27XBRD ANBCTR (SUTURE) IMPLANT
SUT VIC AB 2-0 CT1 27 (SUTURE) ×3
SUT VIC AB 2-0 CT1 TAPERPNT 27 (SUTURE) IMPLANT
TOWEL GREEN STERILE (TOWEL DISPOSABLE) ×6 IMPLANT
TOWEL GREEN STERILE FF (TOWEL DISPOSABLE) ×6 IMPLANT
UNDERPAD 30X30 (UNDERPADS AND DIAPERS) ×3 IMPLANT
WATER STERILE IRR 1000ML POUR (IV SOLUTION) ×6 IMPLANT

## 2019-07-19 NOTE — Transfer of Care (Signed)
Immediate Anesthesia Transfer of Care Note  Patient: Shadiyah L Passon  Procedure(s) Performed: OPEN REDUCTION INTERNAL FIXATION (ORIF) LEFT PILON (Left Ankle) REMOVAL EXTERNAL FIXATION LEG (Left Ankle)  Patient Location: PACU  Anesthesia Type:General  Level of Consciousness: drowsy  Airway & Oxygen Therapy: Patient Spontanous Breathing and Patient connected to face mask oxygen  Post-op Assessment: Report given to RN and Post -op Vital signs reviewed and stable  Post vital signs: Reviewed and stable  Last Vitals:  Vitals Value Taken Time  BP 135/82 07/19/19 1049  Temp    Pulse 109 07/19/19 1051  Resp 16 07/19/19 1051  SpO2 100 % 07/19/19 1051  Vitals shown include unvalidated device data.  Last Pain:  Vitals:   07/19/19 0636  PainSc: 2       Patients Stated Pain Goal: 0 (51/46/04 7998)  Complications: No apparent anesthesia complications

## 2019-07-19 NOTE — Progress Notes (Signed)
Patient unable to provide urine sample for POCT urine pregnancy.  Patient stated her LMP was on 07/09/2019 and no chance of being pregnant.  Dr. Valma Cava notified.  No new orders received.

## 2019-07-19 NOTE — Discharge Summary (Signed)
[facility] (OTS) Discharge Summary   Patient ID: Ana Freeman MRN: [id number] DOB/AGE: 47-May-1972 47 y.o.  Admit date: 07/19/2019 Discharge date: 07/21/2019  Admission Diagnoses: 1. Left type IIIA/B open pilon fracture s/p external fixation 2. Right trimalleolar ankle fracture s/p ORIF  Discharge Diagnoses:  Principal Problem:   Open pilon fracture, left, type III, initial encounter Active Problems:   Type III open pilon fracture of left tibia   Past Medical History:  Diagnosis Date   Anemia    GERD (gastroesophageal reflux disease)    Headache    Hyperlipidemia 07/19/2018   Numbness of lower extremity    Parathyroid abnormality ([place])      Procedures Performed: 1. CPT 27827-Open reduction internal fixation of left pilon fracture 2. CPT 20694-Removal of external fixator left lower extremity 3. CPT 11044-Debridement of external fixator pin sites 4. CPT 15852-Dressing change of right lower extremity  Discharged Condition: good/stable  Hospital Course: Patient presented to [facility] on 07/19/2019 for scheduled surgery on left ankle. Covid test was negative pre-operatively. Was taken to operating room by Dr. Doreatha Martin for the above procedure. Tolerated procedure well. While in the operating room, splint that had previously been applied to right ankle during initial surgery was removed and incisions were covered with a soft dressing consisting of 4x4 gauze and an ace wrap. She was placed in CAM walking boots on bilateral lower extremities post-operatively. Was made non-weightbearing on bilateral lower extremities following procedure. Was admitted to the hospital overnight for observation and pain control. Hgb was 6.7 on post-operative day #1 which required a transfusion of 1 unit PRBCs. Was restated on Lovenox for DVT prophylaxis on POD #2.  On 07/21/2019, the patient was tolerating diet, working well with therapies, pain well controlled, vital signs  stable, dressings clean, dry, intact and felt stable for discharge to home. Patient will follow up as below and knows to call with questions or concerns.     Consults: None  Significant Diagnostic Studies: None  Results for orders placed or performed during the hospital encounter of 07/19/19 (from the past 168 hour(s))  CBC   Collection Time: 07/19/19  2:52 PM  Result Value Ref Range   WBC 11.3 (H) 4.0 - 10.5 K/uL   RBC 3.61 (L) 3.87 - 5.11 MIL/uL   Hemoglobin 7.5 (L) 12.0 - 15.0 g/dL   HCT 26.9 (L) 36.0 - 46.0 %   MCV 74.5 (L) 80.0 - 100.0 fL   MCH 20.8 (L) 26.0 - 34.0 pg   MCHC 27.9 (L) 30.0 - 36.0 g/dL   RDW 18.8 (H) 11.5 - 15.5 %   Platelets 627 (H) 150 - 400 K/uL   nRBC 0.0 0.0 - 0.2 %  Creatinine, serum   Collection Time: 07/19/19  2:52 PM  Result Value Ref Range   Creatinine, Ser 0.91 0.44 - 1.00 mg/dL   GFR calc non Af Amer >60 >60 mL/min   GFR calc Af Amer >60 >60 mL/min  Basic metabolic panel   Collection Time: 07/20/19  1:51 AM  Result Value Ref Range   Sodium 133 (L) 135 - 145 mmol/L   Potassium 4.5 3.5 - 5.1 mmol/L   Chloride 102 98 - 111 mmol/L   CO2 22 22 - 32 mmol/L   Glucose, Bld 100 (H) 70 - 99 mg/dL   BUN 10 6 - 20 mg/dL   Creatinine, Ser 0.79 0.44 - 1.00 mg/dL   Calcium 10.1 8.9 - 10.3 mg/dL   GFR calc non  Af Amer >60 >60 mL/min   GFR calc Af Amer >60 >60 mL/min   Anion gap 9 5 - 15  CBC   Collection Time: 07/20/19  1:51 AM  Result Value Ref Range   WBC 8.8 4.0 - 10.5 K/uL   RBC 3.17 (L) 3.87 - 5.11 MIL/uL   Hemoglobin 6.7 (LL) 12.0 - 15.0 g/dL   HCT 23.3 (L) 36.0 - 46.0 %   MCV 73.5 (L) 80.0 - 100.0 fL   MCH 21.1 (L) 26.0 - 34.0 pg   MCHC 28.8 (L) 30.0 - 36.0 g/dL   RDW 18.7 (H) 11.5 - 15.5 %   Platelets 538 (H) 150 - 400 K/uL   nRBC 0.2 0.0 - 0.2 %  Type and screen [place]   Collection Time: 07/20/19  5:45 AM  Result Value Ref Range   ABO/RH(D) O POS    Antibody Screen NEG    Sample Expiration 07/23/2019,2359     Unit Number [id number]    Blood Component Type RED CELLS,LR    Unit division 00    Status of Unit ISSUED    Transfusion Status OK TO TRANSFUSE    Crossmatch Result      Compatible Performed at [facility], [place] [address] [place], [place] [address]   ABO/Rh   Collection Time: 07/20/19  5:45 AM  Result Value Ref Range   ABO/RH(D)      O POS Performed at [facility] [address] [place], [place] [address]   BPAM [facility]   Collection Time: 07/20/19  5:45 AM  Result Value Ref Range   ISSUE DATE / TIME [id number]    Blood Product Unit Number [id number]    PRODUCT CODE [id number]    Unit Type and Rh 5100    Blood Product Expiration Date [id number]   Prepare RBC   Collection Time: 07/20/19 10:27 AM  Result Value Ref Range   Order Confirmation      ORDER PROCESSED BY BLOOD BANK Performed at [facility], [place] [address] [place], [place] [address]   Hemoglobin and hematocrit, blood   Collection Time: 07/20/19  6:11 PM  Result Value Ref Range   Hemoglobin 8.6 (L) 12.0 - 15.0 g/dL   HCT 28.7 (L) 36.0 - 69.7 %  Basic metabolic panel   Collection Time: 07/21/19  2:05 AM  Result Value Ref Range   Sodium 136 135 - 145 mmol/L   Potassium 4.4 3.5 - 5.1 mmol/L   Chloride 105 98 - 111 mmol/L   CO2 23 22 - 32 mmol/L   Glucose, Bld 104 (H) 70 - 99 mg/dL   BUN 8 6 - 20 mg/dL   Creatinine, Ser 0.77 0.44 - 1.00 mg/dL   Calcium 10.4 (H) 8.9 - 10.3 mg/dL   GFR calc non Af Amer >60 >60 mL/min   GFR calc Af Amer >60 >60 mL/min   Anion gap 8 5 - 15  CBC   Collection Time: 07/21/19  2:05 AM  Result Value Ref Range   WBC 6.3 4.0 - 10.5 K/uL   RBC 3.64 (L) 3.87 - 5.11 MIL/uL   Hemoglobin 8.1 (L) 12.0 - 15.0 g/dL   HCT 27.5 (L) 36.0 - 46.0 %   MCV 75.5 (L) 80.0 - 100.0 fL   MCH 22.3 (L) 26.0 - 34.0 pg   MCHC 29.5 (L) 30.0 - 36.0 g/dL   RDW 18.9 (H) 11.5 - 15.5 %   Platelets 532 (H) 150 -  400 K/uL   nRBC 0.0 0.0 - 0.2 %  Results for orders placed or performed  during the hospital encounter of 07/17/19 (from the past 168 hour(s))  SARS CORONAVIRUS 2 Nasal Swab Aptima Multi Swab   Collection Time: 07/17/19 11:08 AM   Specimen: Aptima Multi Swab; Nasal Swab  Result Value Ref Range   SARS Coronavirus 2 NEGATIVE NEGATIVE     Treatments: 1. CPT 27827-Open reduction internal fixation of left pilon fracture 2. CPT 20694-Removal of external fixator left lower extremity 3. CPT 11044-Debridement of external fixator pin sites 4. CPT 15852-Dressing change of right lower extremity  Discharge Exam: General - Laying in bed, NAD. Pleasant and cooperative  Right Lower Extremity - Dressing clean, dry, intact. Swelling in toes improving. Minimal pain with palpation of ankle or foot. Dorsiflexion/plantarflexion intact without significant discomfort. Compartments soft and compressible. 2+ DP pulse  Left Lower Extremity - Dressing removed, incisions clean, intact, with small amount of serosanguinous drainage from lateral incision. Pain with palpation of foot and ankle. wiggles toes. Sensation intact to light touch of plantar and dorsal surface of foot. Toes warm and well perfused. 2+ DP pulse  Disposition: Discharge disposition: 01-Home or Self Care        Allergies as of 07/21/2019   No Known Allergies     Medication List    TAKE these medications   acetaminophen 500 MG tablet Commonly known as: TYLENOL Take 1,000 mg by mouth every 8 (eight) hours as needed (pain).   calcium carbonate 500 MG chewable tablet Commonly known as: TUMS - dosed in mg elemental calcium Chew 3 tablets by mouth daily as needed for indigestion or heartburn.   docusate sodium 100 MG capsule Commonly known as: COLACE Take 100 mg by mouth every morning.   doxycycline 100 MG tablet Commonly known as: ADOXA Take 1 tablet (100 mg total) by mouth 2 (two) times daily for 7 days.   enoxaparin 40 MG/0.4ML injection Commonly known as: LOVENOX Inject 0.4 mLs (40 mg total) into  the skin daily.   ferrous sulfate 325 (65 FE) MG tablet Take 1 tablet (325 mg total) by mouth daily with breakfast.   gabapentin 300 MG capsule Commonly known as: NEURONTIN Take 1 capsule (300 mg total) by mouth 3 (three) times daily.   methocarbamol 750 MG tablet Commonly known as: ROBAXIN Take 1 tablet (750 mg total) by mouth every 6 (six) hours as needed for muscle spasms. What changed:   when to take this  reasons to take this   Oxycodone HCl 10 MG Tabs Take 1 tablet (10 mg total) by mouth every 4 (four) hours as needed.   valACYclovir 1000 MG tablet Commonly known as: VALTREX Take 2,000 mg by mouth See admin instructions. Take 2,000 mg by mouth every 12 hours for 2 doses as needed/as directed for cold sore flares   Vitamin B-12 1000 MCG Subl Place 1,000 mcg under the tongue daily.   Vitamin D (Ergocalciferol) 1.25 MG (50000 UT) Caps capsule Commonly known as: DRISDOL Take 50,000 Units by mouth every 7 (seven) days. Fridays      Follow-up Information    Haddix, Thomasene Lot, MD. Go on 07/24/2019.   Specialty: Orthopedic Surgery Why: on 07/24/19 at 8:30AM for wound re-check Contact information: [place] [address] [phone]           Discharge Instructions and Plan: Patient will be discharged to home. Will remain non-weightbearing on bilateral legs. Will continue Lovenox for DVT prophylaxis  at discharge. Patient already has all the necessary DME for discharge. Patient will follow up with Dr. Doreatha Martin next week (07/24/2019) for wound re-check. All questions have been answered to patient's satisfaction.    Signed:  Leary Roca. Carmie Kanner ?[phone] (phone) 07/21/2019, 7:36 AM  [facility] [place] [address] [phone] (F)

## 2019-07-19 NOTE — Anesthesia Procedure Notes (Signed)
Anesthesia Regional Block: Adductor canal block   Pre-Anesthetic Checklist: ,, timeout performed, Correct Patient, Correct Site, Correct Laterality, Correct Procedure, Correct Position, site marked, Risks and benefits discussed,  Surgical consent,  Pre-op evaluation,  At surgeon's request and post-op pain management  Laterality: Lower and Left  Prep: chloraprep       Needles:  Injection technique: Single-shot  Needle Type: Echogenic Needle     Needle Length: 9cm  Needle Gauge: 22     Additional Needles:   Procedures:,,,, ultrasound used (permanent image in chart),,,,  Narrative:  Start time: 07/19/2019 8:10 AM End time: 07/19/2019 8:15 AM Injection made incrementally with aspirations every 5 mL.  Performed by: Personally  Anesthesiologist: Barnet Glasgow, MD  Additional Notes: Block assessed prior to surgery. Pt tolerated procedure well.

## 2019-07-19 NOTE — Interval H&P Note (Signed)
History and Physical Interval Note:  07/19/2019 7:20 AM  Ana Freeman  has presented today for surgery, with the diagnosis of Left open pilon fracture.  The various methods of treatment have been discussed with the patient and family. After consideration of risks, benefits and other options for treatment, the patient has consented to  Procedure(s): OPEN REDUCTION INTERNAL FIXATION (ORIF) LEFT PILON (Left) REMOVAL EXTERNAL FIXATION LEG (Left) as a surgical intervention.  The patient's history has been reviewed, patient examined, no change in status, stable for surgery.  I have reviewed the patient's chart and labs.  Questions were answered to the patient's satisfaction.     Lennette Bihari P Gemini Beaumier

## 2019-07-19 NOTE — Anesthesia Postprocedure Evaluation (Signed)
Anesthesia Post Note  Patient: Ana Freeman  Procedure(s) Performed: OPEN REDUCTION INTERNAL FIXATION (ORIF) LEFT PILON (Left Ankle) REMOVAL EXTERNAL FIXATION LEG (Left Ankle)     Patient location during evaluation: PACU Anesthesia Type: General Level of consciousness: awake and alert Pain management: pain level controlled Vital Signs Assessment: post-procedure vital signs reviewed and stable Respiratory status: spontaneous breathing, nonlabored ventilation, respiratory function stable and patient connected to nasal cannula oxygen Cardiovascular status: blood pressure returned to baseline and stable Postop Assessment: no apparent nausea or vomiting Anesthetic complications: no    Last Vitals:  Vitals:   07/19/19 1145 07/19/19 1200  BP: 122/74   Pulse: 82 80  Resp: (!) 25 19  Temp:    SpO2: 100% 100%    Last Pain:  Vitals:   07/19/19 1200  PainSc: 4                  Barnet Glasgow

## 2019-07-19 NOTE — Plan of Care (Signed)

## 2019-07-19 NOTE — Op Note (Signed)
Orthopaedic Surgery Operative Note (CSN: [id number] ) Date of Surgery: 07/19/2019  Admit Date: 07/19/2019   Diagnoses: Pre-Op Diagnoses: Left type IIIA/B open pilon fracture Right trimalleolar ankle fracture  Post-Op Diagnosis: Same  Procedures: 1. CPT 27827-Open reduction internal fixation of left pilon fracture 2. CPT 20694-Removal of external fixator left lower extremity 3. CPT 11044-Debridement of external fixator pin sites 4. CPT 15852-Dressing change of right lower extremity  Surgeons : Primary: , Thomasene Lot, MD  Assistant: Patrecia Pace, PA-C  Location: OR 7   Anesthesia:General  Antibiotics: Ancef 2g preop   Tourniquet time:None  Estimated Blood UYQI:347 mL  Complications:None   Specimens:None   Implants: Implant Name Type Inv. Item Serial No. Manufacturer Lot No. LRB No. Used Action  PLATE 9H LT DIST ANTLAT TIB - [id number] Plate PLATE 9H LT DIST ANTLAT TIB  [employer](ORTH,TRAU,BIO,SG)  Left 1 Implanted  SCREW T15 LP CORT 3.5X40MM NS - [id number] Screw SCREW T15 LP CORT 3.5X40MM NS  [employer](ORTH,TRAU,BIO,SG)  Left 1 Implanted  SCREW T15 MD 3.5X30MM NS - [id number] Screw SCREW T15 MD 3.5X30MM NS  [employer](ORTH,TRAU,BIO,SG)  Left 1 Implanted  SCREW T15 MD 3.5X34MM NS - [id number] Screw SCREW T15 MD 3.5X34MM NS  [employer](ORTH,TRAU,BIO,SG)  Left 1 Implanted  SCREW T15 MD 3.5X36MM NS - [id number] Screw SCREW T15 MD 3.5X36MM NS  [employer](ORTH,TRAU,BIO,SG)  Left 2 Implanted  SCREW CORTICAL 3.5MM  30MM - [id number] Screw SCREW CORTICAL 3.5MM  30MM  [employer](ORTH,TRAU,BIO,SG)  Left 1 Implanted  SCREW CORTICAL 3.5MM  28MM - [id number] Screw SCREW CORTICAL 3.5MM  28MM  [employer](ORTH,TRAU,BIO,SG)  Left 1 Implanted  SCREW CORTICAL 3.5MM 26MM - [id number] Screw SCREW CORTICAL 3.5MM 26MM  [employer](ORTH,TRAU,BIO,SG)  Left 2 Implanted     Indications for Surgery: 48 year old female who fell from a balcony.  Sustained a left type IIIA/B open pilon  fracture and underwent two I&D's and external fixation.  Her wound had been stable and the swelling returned to a safe female to proceed with plating.  Risks and benefits were discussed with the patient. Risks discussed included bleeding requiring blood transfusion, bleeding causing a hematoma, infection, malunion, nonunion, damage to surrounding nerves and blood vessels, pain, hardware prominence or irritation, hardware failure, stiffness, post-traumatic arthritis, DVT/PE, and anesthetic complications.  Operative Findings: 1.  Open reduction internal fixation of left distal tibia/pilon using [employer] anterior lateral ALPS distal tibia plate 2.  Removal of external fixator and debridement of external fixator pin sites. 3.  Dressing change of right lower extremity under anesthesia.  Wounds all are stable  Procedure: The patient was identified in the preoperative holding area. Consent was confirmed with the patient and their family and all questions were answered. The operative extremity was marked after confirmation with the patient. she was then brought back to the operating room by our anesthesia colleagues.  She was carefully transferred over to a radiolucent flat top table.  She was placed under general anesthetic.  A bump was placed under her operative hip.  Her external fixator was removed prior to prepping the leg.The operative extremity was then prepped and draped in usual sterile fashion. A preoperative timeout was performed to verify the patient, the procedure, and the extremity. Preoperative antibiotics were dosed.  I have covered the ex-fix pin sites with I band as well as the traumatic laceration.  I then made a anterior lateral approach.  I kept the 6 cm skin bridge between the incision and the previous traumatic laceration.  I carried  down through skin and subcutaneous tissue.  I protected the branches of the superficial peroneal nerve.  I then incised through the extensor retinaculum and  performed a dissection along the anterior lateral aspect of the distal tibia.  I used a Cobb elevator to mobilize the extensor tendons to visualize the anterior portion of the distal plafond.  A reduction tenaculum was used to reduce the split in the joint.  Anatomic reduction was able to be obtained after clearing out the callus and healing that occurred.  This was provisionally held with K wires.  I then chose a 9 hole ALPS anterolateral distal tibial plate and slid this submuscularly along the lateral cortex of the tibia.  I provisionally held it in place with a K wire distally and percutaneously placed K wire proximally while I confirmed adequate reduction and placement of the plate.  I placed a nonlocking screw in the distal segment to bring the plate flush to bone.  I then placed a nonlocking screw into the tibial shaft to bring the proximal portion of the plate flush to the shaft.  Once I was pleased with the overall alignment and reduction and placement of the plate I then placed four multidirectional locking screws in the distal segment.  I then placed percutaneous screws into the tibial shaft to complete the construct.  Final fluoroscopic images were obtained.  The incision was copiously irrigated.  A gram of vancomycin powder 1.2 g tobramycin powder were placed into the incisions deep to the musculature along the plate and fracture.  I then closed the extensor retinaculum with a 0 Vicryl suture.  The skin was closed with 2-0 Vicryl and 3-0 nylon.  The Charlie Pitter was then removed and the pin sites were debrided with a curette.  They were irrigated and then closed with a 3-0 nylon suture.  Sterile dressing consisting of bacitracin ointment, Adaptic, 4 x 4's and sterile cast padding was placed.  We then broke down the drapes changed her right lower extremity splint.  Her wounds were all stable without any signs of erythema or drainage.  We then placed both extremities into a boot.  Patient was then awoken  from anesthesia and taken to PACU in stable condition.  Post Op Plan/Instructions: Patient will continue nonweightbearing on bilateral lower extremities.  We will admit her overnight for observation.  She will receive Ancef for surgical prophylaxis.  She will continue with Lovenox for DVT prophylaxis.  We will see her next week for wound check.  I was present and performed the entire surgery.  Patrecia Pace, PA-C did assist me throughout the case. An assistant was necessary given the difficulty in approach, maintenance of reduction and ability to instrument the fracture.   Katha Hamming, MD [facility]

## 2019-07-19 NOTE — Anesthesia Procedure Notes (Addendum)
Procedure Name: LMA Insertion Date/Time: 07/19/2019 8:32 AM Performed by: Bryson Corona, CRNA Pre-anesthesia Checklist: Patient identified, Emergency Drugs available, Suction available and Patient being monitored Patient Re-evaluated:Patient Re-evaluated prior to induction Oxygen Delivery Method: Circle System Utilized Preoxygenation: Pre-oxygenation with 100% oxygen Induction Type: IV induction LMA: LMA inserted LMA Size: 4.0 Number of attempts: 1 Placement Confirmation: positive ETCO2 Tube secured with: Tape Dental Injury: Teeth and Oropharynx as per pre-operative assessment

## 2019-07-19 NOTE — Anesthesia Procedure Notes (Addendum)
Anesthesia Regional Block: Popliteal block   Pre-Anesthetic Checklist: ,, timeout performed, Correct Patient, Correct Site, Correct Laterality, Correct Procedure, Correct Position, site marked, Risks and benefits discussed, pre-op evaluation,  At surgeon's request and post-op pain management  Laterality: Left  Prep: Maximum Sterile Barrier Precautions used, chloraprep       Needles:  Injection technique: Single-shot  Needle Type: Echogenic Needle     Needle Length: 9cm  Needle Gauge: 21     Additional Needles:   Procedures:,,,, ultrasound used (permanent image in chart),,,,  Narrative:  Start time: 07/19/2019 8:03 AM End time: 07/19/2019 8:08 AM Injection made incrementally with aspirations every 5 mL.  Performed by: Personally  Anesthesiologist: Barnet Glasgow, MD  Additional Notes: Block assessed. Patient tolerated procedure well.

## 2019-07-19 NOTE — Discharge Instructions (Signed)
[facility] Discharge Instructions   General Discharge Instructions  WEIGHT BEARING STATUS: Non-weightbearing on bilateral lower extremities  RANGE OF MOTION/ACTIVITY: Okay to come out of boots for for gentle range of motion of ankles  Wound Care: Incisions can be left open to air if there is no drainage. If incision continues to have drainage, follow wound care instructions below. Okay to shower if no drainage from incisions. Steri-strips on right leg will fall off on their own. It is okay for steri-strips to get wet.  DVT/PE prophylaxis: Lovenox  Diet: as you were eating previously.  Can use over the counter stool softeners and bowel preparations, such as Miralax, to help with bowel movements.  Narcotics can be constipating.  Be sure to drink plenty of fluids  PAIN MEDICATION USE AND EXPECTATIONS  You have likely been given narcotic medications to help control your pain.  After a traumatic event that results in an fracture (broken bone) with or without surgery, it is ok to use narcotic pain medications to help control one's pain.  We understand that everyone responds to pain differently and each individual patient will be evaluated on a regular basis for the continued need for narcotic medications. Ideally, narcotic medication use should last no more than 6-8 weeks (coinciding with fracture healing).   As a patient it is your responsibility as well to monitor narcotic medication use and report the amount and frequency you use these medications when you come to your office visit.   We would also advise that if you are using narcotic medications, you should take a dose prior to therapy to maximize you participation.  IF YOU ARE ON NARCOTIC MEDICATIONS IT IS NOT PERMISSIBLE TO OPERATE A MOTOR VEHICLE (MOTORCYCLE/CAR/TRUCK/MOPED) OR HEAVY MACHINERY DO NOT MIX NARCOTICS WITH OTHER CNS (CENTRAL NERVOUS SYSTEM) DEPRESSANTS SUCH AS ALCOHOL   STOP SMOKING OR USING NICOTINE  PRODUCTS!!!!  As discussed nicotine severely impairs your body's ability to heal surgical and traumatic wounds but also impairs bone healing.  Wounds and bone heal by forming microscopic blood vessels (angiogenesis) and nicotine is a vasoconstrictor (essentially, shrinks blood vessels).  Therefore, if vasoconstriction occurs to these microscopic blood vessels they essentially disappear and are unable to deliver necessary nutrients to the healing tissue.  This is one modifiable factor that you can do to dramatically increase your chances of healing your injury.    (This means no smoking, no nicotine gum, patches, etc)  DO NOT USE NONSTEROIDAL ANTI-INFLAMMATORY DRUGS (NSAID'S)  Using products such as Advil (ibuprofen), Aleve (naproxen), Motrin (ibuprofen) for additional pain control during fracture healing can delay and/or prevent the healing response.  If you would like to take over the counter (OTC) medication, Tylenol (acetaminophen) is ok.  However, some narcotic medications that are given for pain control contain acetaminophen as well. Therefore, you should not exceed more than 4000 mg of tylenol in a day if you do not have liver disease.  Also note that there are may OTC medicines, such as cold medicines and allergy medicines that my contain tylenol as well.  If you have any questions about medications and/or interactions please ask your doctor/PA or your pharmacist.      ICE AND ELEVATE INJURED/OPERATIVE EXTREMITY  Using ice and elevating the injured extremity above your heart can help with swelling and pain control.  Icing in a pulsatile fashion, such as 20 minutes on and 20 minutes off, can be followed.    Do not place ice directly on skin. Make  sure there is a barrier between to skin and the ice pack.    Using frozen items such as frozen peas works well as the conform nicely to the are that needs to be iced.  USE AN ACE WRAP OR TED HOSE FOR SWELLING CONTROL  In addition to icing and elevation,  Ace wraps or TED hose are used to help limit and resolve swelling.  It is recommended to use Ace wraps or TED hose until you are informed to stop.    When using Ace Wraps start the wrapping distally (farthest away from the body) and wrap proximally (closer to the body)   Example: If you had surgery on your leg or thing and you do not have a splint on, start the ace wrap at the toes and work your way up to the thigh        If you had surgery on your upper extremity and do not have a splint on, start the ace wrap at your fingers and work your way up to the upper arm    IF YOU ARE IN A CAM BOOT (BLACK BOOT)  You may remove boot periodically. Perform daily dressing changes as noted below.  Wash the liner of the boot regularly and wear a sock when wearing the boot. It is recommended that you sleep in the boot until told otherwise   CALL THE OFFICE WITH ANY QUESTIONS OR CONCERNS: [phone]   VISIT OUR WEBSITE FOR ADDITIONAL INFORMATION: [email or web address]      Discharge Wound Care Instructions  Do NOT apply any ointments, solutions or lotions to pin sites or surgical wounds.  These prevent needed drainage and even though solutions like hydrogen peroxide kill bacteria, they also damage cells lining the pin sites that help fight infection.  Applying lotions or ointments can keep the wounds moist and can cause them to breakdown and open up as well. This can increase the risk for infection. When in doubt call the office.  Surgical incisions should be dressed daily.  If any drainage is noted, use one layer of adaptic, then gauze, Kerlix, and an ace wrap.  Once the incision is completely dry and without drainage, it may be left open to air out.  Showering may begin 36-48 hours later.  Cleaning gently with soap and water.  Traumatic wounds should be dressed daily as well.    One layer of adaptic, gauze, Kerlix, then ace wrap.  The adaptic can be discontinued once the draining has ceased    If  you have a wet to dry dressing: wet the gauze with saline the squeeze as much saline out so the gauze is moist (not soaking wet), place moistened gauze over wound, then place a dry gauze over the moist one, followed by Kerlix wrap, then ace wrap.

## 2019-07-20 ENCOUNTER — Encounter (HOSPITAL_COMMUNITY): Payer: Self-pay | Admitting: Student

## 2019-07-20 DIAGNOSIS — S82872A Displaced pilon fracture of left tibia, initial encounter for closed fracture: Secondary | ICD-10-CM | POA: Diagnosis not present

## 2019-07-20 LAB — BASIC METABOLIC PANEL
Anion gap: 9 (ref 5–15)
BUN: 10 mg/dL (ref 6–20)
CO2: 22 mmol/L (ref 22–32)
Calcium: 10.1 mg/dL (ref 8.9–10.3)
Chloride: 102 mmol/L (ref 98–111)
Creatinine, Ser: 0.79 mg/dL (ref 0.44–1.00)
GFR calc Af Amer: >60 mL/min (ref >60)
GFR calc non Af Amer: >60 mL/min (ref >60)
Glucose, Bld: 100 mg/dL — ABNORMAL HIGH (ref 70–99)
Potassium: 4.5 mmol/L (ref 3.5–5.1)
Sodium: 133 mmol/L — ABNORMAL LOW (ref 135–145)

## 2019-07-20 LAB — CBC
HCT: 23.3 % — ABNORMAL LOW (ref 36.0–46.0)
Hemoglobin: 6.7 g/dL — CL (ref 12.0–15.0)
MCH: 21.1 pg — ABNORMAL LOW (ref 26.0–34.0)
MCHC: 28.8 g/dL — ABNORMAL LOW (ref 30.0–36.0)
MCV: 73.5 fL — ABNORMAL LOW (ref 80.0–100.0)
Platelets: 538 10*3/uL — ABNORMAL HIGH (ref 150–400)
RBC: 3.17 MIL/uL — ABNORMAL LOW (ref 3.87–5.11)
RDW: 18.7 % — ABNORMAL HIGH (ref 11.5–15.5)
WBC: 8.8 10*3/uL (ref 4.0–10.5)
nRBC: 0.2 % (ref 0.0–0.2)

## 2019-07-20 LAB — HEMOGLOBIN AND HEMATOCRIT, BLOOD
HCT: 28.7 % — ABNORMAL LOW (ref 36.0–46.0)
Hemoglobin: 8.6 g/dL — ABNORMAL LOW (ref 12.0–15.0)

## 2019-07-20 LAB — PREPARE RBC (CROSSMATCH)

## 2019-07-20 LAB — ABO/RH: ABO/RH(D): O POS

## 2019-07-20 MED ORDER — SODIUM CHLORIDE 0.9% IV SOLUTION: Freq: Once | INTRAVENOUS | Status: DC

## 2019-07-20 MED ORDER — OXYCODONE HCL 5 MG PO TABS
10.0000 mg | ORAL_TABLET | Freq: Once | ORAL | Status: AC
  Administered 2019-07-20: 12:00:00 10 mg via ORAL
  Filled 2019-07-20: qty 2

## 2019-07-20 MED ORDER — KETOROLAC TROMETHAMINE 15 MG/ML IJ SOLN
15.0000 mg | Freq: Four times a day (QID) | INTRAMUSCULAR | Status: DC
  Administered 2019-07-20 – 2019-07-21 (×4): 15 mg via INTRAVENOUS
  Filled 2019-07-20 (×4): qty 1

## 2019-07-20 MED ORDER — ENOXAPARIN SODIUM 40 MG/0.4ML ~~LOC~~ SOLN
40.0000 mg | SUBCUTANEOUS | Status: DC
  Administered 2019-07-21: 40 mg via SUBCUTANEOUS
  Filled 2019-07-20: qty 0.4

## 2019-07-20 NOTE — Plan of Care (Signed)

## 2019-07-20 NOTE — Progress Notes (Signed)
Patient's hemoglobin is 6.7.  On call MD notified. Orders to transfuse 1 unit PRBC.

## 2019-07-20 NOTE — Progress Notes (Signed)
Started 1 unit PRBC transfusion, no adverse reactions noted, vital signs taken and recorded, will continue to monitor.

## 2019-07-20 NOTE — Progress Notes (Signed)
Orthopaedic Trauma Progress Note  S: Doing okay this AM, having a lot of pain in left ankle and foot. Nerve block has started to wear off from surgery. Right ankle doing great, minimal pain. Hgb low this AM, will receive 1 unit PRBCs.    O:  Vitals:   07/20/19 0022 07/20/19 0403  BP: 135/75 114/70  Pulse: (!) 108 83  Resp: 16 14  Temp: 98.5 F (36.9 C) 98.5 F (36.9 C)  SpO2: 100% 99%    General - Laying in bed, NAD. Pleasant and cooperative  Right Lower Extremity - Dressing clean, dry, intact. Swelling in toes improving. Minimal pain with palpation of ankle or foot. Dorsiflexion/plantaflexion intact without significant discomfort. Compartments soft and compressible. 2+ DP pulse  Left Lower Extremity - Dressing clean, dry, intact. Significant pain with palpation of foot and ankle. Difficulty moving toes secondary to swelling. Hypersensitive to light touch of plantar surface of foot. Normal sensation dorsal surface. Toes warm and well perfused.  Imaging: Stable post op imaging.   Labs:  Results for orders placed or performed during the hospital encounter of 07/19/19 (from the past 24 hour(s))  CBC     Status: Abnormal   Collection Time: 07/19/19  2:52 PM  Result Value Ref Range   WBC 11.3 (H) 4.0 - 10.5 K/uL   RBC 3.61 (L) 3.87 - 5.11 MIL/uL   Hemoglobin 7.5 (L) 12.0 - 15.0 g/dL   HCT 26.9 (L) 36.0 - 46.0 %   MCV 74.5 (L) 80.0 - 100.0 fL   MCH 20.8 (L) 26.0 - 34.0 pg   MCHC 27.9 (L) 30.0 - 36.0 g/dL   RDW 18.8 (H) 11.5 - 15.5 %   Platelets 627 (H) 150 - 400 K/uL   nRBC 0.0 0.0 - 0.2 %  Creatinine, serum     Status: None   Collection Time: 07/19/19  2:52 PM  Result Value Ref Range   Creatinine, Ser 0.91 0.44 - 1.00 mg/dL   GFR calc non Af Amer >60 >60 mL/min   GFR calc Af Amer >60 >60 mL/min  Basic metabolic panel     Status: Abnormal   Collection Time: 07/20/19  1:51 AM  Result Value Ref Range   Sodium 133 (L) 135 - 145 mmol/L   Potassium 4.5 3.5 - 5.1 mmol/L   Chloride 102 98 - 111 mmol/L   CO2 22 22 - 32 mmol/L   Glucose, Bld 100 (H) 70 - 99 mg/dL   BUN 10 6 - 20 mg/dL   Creatinine, Ser 0.79 0.44 - 1.00 mg/dL   Calcium 10.1 8.9 - 10.3 mg/dL   GFR calc non Af Amer >60 >60 mL/min   GFR calc Af Amer >60 >60 mL/min   Anion gap 9 5 - 15  CBC     Status: Abnormal   Collection Time: 07/20/19  1:51 AM  Result Value Ref Range   WBC 8.8 4.0 - 10.5 K/uL   RBC 3.17 (L) 3.87 - 5.11 MIL/uL   Hemoglobin 6.7 (LL) 12.0 - 15.0 g/dL   HCT 23.3 (L) 36.0 - 46.0 %   MCV 73.5 (L) 80.0 - 100.0 fL   MCH 21.1 (L) 26.0 - 34.0 pg   MCHC 28.8 (L) 30.0 - 36.0 g/dL   RDW 18.7 (H) 11.5 - 15.5 %   Platelets 538 (H) 150 - 400 K/uL   nRBC 0.2 0.0 - 0.2 %  Type and screen [place]     Status: None   Collection  Time: 07/20/19  5:45 AM  Result Value Ref Range   ABO/RH(D) O POS    Antibody Screen NEG    Sample Expiration      07/23/2019,2359 Performed at [facility], [place] [address], [place], [place] [address]     Assessment: 48 year old female s/p fall  Injuries: 1. Right trimalleolar ankle fracture s/p ORIF 07/08/19 2. Left Type IIIA open pilon fracture s/p removal of ex-fix and ORIF 07/19/19   Weightbearing: NWB BLE  Insicional and dressing care: will change tomorrow  Orthopedic device(s): CAM walking boots BLE  Okay to come out of boots while in bed, please re-apply boots if out of bed  CV/Blood loss: Acute blood loss anemia, Hgb 6.7 this AM. Will receive 1 unit PRBCs this morning. Slightly tachycardic, likely due to pain  Pain management:  1. Tylenol 1000 mg q 6 hours scheduled 2. Gabapentin 300 mg TID 3. Oxycodone 15 mg q 4 hours PRN 4. Robaxin 500 mg q 6 hours PRN  VTE prophylaxis: Start SCDs. Hold Lovenox today, monitor CBC.  ID: Ancef 2gm post op completed  Foley/Lines: No foley, KVO IVFs  Medical co-morbidities: None  Impediments to Fracture Healing: Polytrauma  Dispo: Plan for discharge home this afternoon or  tomorrow with outpatient follow up with Dr. Doreatha Martin early next week.   Follow - up plan: Tuesday for wound check in office   Millie Shorb A. Carmie [facility] ?([phone]? (phone)
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
## 2019-07-21 DIAGNOSIS — S82872A Displaced pilon fracture of left tibia, initial encounter for closed fracture: Secondary | ICD-10-CM | POA: Diagnosis not present

## 2019-07-21 LAB — TYPE AND SCREEN
ABO/RH(D): O POS
Antibody Screen: NEGATIVE
Unit division: 0

## 2019-07-21 LAB — CBC
HCT: 27.5 % — ABNORMAL LOW (ref 36.0–46.0)
Hemoglobin: 8.1 g/dL — ABNORMAL LOW (ref 12.0–15.0)
MCH: 22.3 pg — ABNORMAL LOW (ref 26.0–34.0)
MCHC: 29.5 g/dL — ABNORMAL LOW (ref 30.0–36.0)
MCV: 75.5 fL — ABNORMAL LOW (ref 80.0–100.0)
Platelets: 532 10*3/uL — ABNORMAL HIGH (ref 150–400)
RBC: 3.64 MIL/uL — ABNORMAL LOW (ref 3.87–5.11)
RDW: 18.9 % — ABNORMAL HIGH (ref 11.5–15.5)
WBC: 6.3 10*3/uL (ref 4.0–10.5)
nRBC: 0 % (ref 0.0–0.2)

## 2019-07-21 LAB — BASIC METABOLIC PANEL
Anion gap: 8 (ref 5–15)
BUN: 8 mg/dL (ref 6–20)
CO2: 23 mmol/L (ref 22–32)
Calcium: 10.4 mg/dL — ABNORMAL HIGH (ref 8.9–10.3)
Chloride: 105 mmol/L (ref 98–111)
Creatinine, Ser: 0.77 mg/dL (ref 0.44–1.00)
GFR calc Af Amer: >60 mL/min (ref >60)
GFR calc non Af Amer: >60 mL/min (ref >60)
Glucose, Bld: 104 mg/dL — ABNORMAL HIGH (ref 70–99)
Potassium: 4.4 mmol/L (ref 3.5–5.1)
Sodium: 136 mmol/L (ref 135–145)

## 2019-07-21 LAB — BPAM RBC
Blood Product Expiration Date: 202009022359
ISSUE DATE / TIME: 202008071241
Unit Type and Rh: 5100

## 2019-07-21 MED ORDER — METHOCARBAMOL 750 MG PO TABS: 750.0000 mg | ORAL_TABLET | Freq: Four times a day (QID) | ORAL | 0 refills | Status: DC | PRN

## 2019-07-21 MED ORDER — OXYCODONE HCL 10 MG PO TABS: 10.0000 mg | ORAL_TABLET | ORAL | 0 refills | Status: DC | PRN

## 2019-07-21 MED ORDER — DOXYCYCLINE MONOHYDRATE 100 MG PO TABS: 100.0000 mg | ORAL_TABLET | Freq: Two times a day (BID) | ORAL | 0 refills | Status: AC

## 2019-07-21 MED ORDER — FERROUS SULFATE 325 (65 FE) MG PO TABS: 325.0000 mg | ORAL_TABLET | Freq: Every day | ORAL | 3 refills | Status: DC

## 2019-07-21 MED ORDER — FERROUS SULFATE 325 (65 FE) MG PO TABS
325.0000 mg | ORAL_TABLET | Freq: Every day | ORAL | Status: DC
  Administered 2019-07-21: 325 mg via ORAL
  Filled 2019-07-21: qty 1

## 2019-07-21 MED ORDER — GABAPENTIN 300 MG PO CAPS: 300.0000 mg | ORAL_CAPSULE | Freq: Three times a day (TID) | ORAL | 0 refills | Status: DC

## 2019-07-21 NOTE — Progress Notes (Signed)
Pt discharge education provided at bedside  Pt IV removed, catheter intact  Pt has all belongings including lower leg and back braces  Pt discharged via wheelchair with NT

## 2019-07-21 NOTE — TOC Transition Note (Signed)
Transition of Care [facility]) - CM/SW Discharge Note   Patient Details  Name: Ana Freeman MRN: [id number] Date of Birth: Aug 18, 1971  [facility] CM/SW Contact:  Claudie Leach, RN Phone Number: 07/21/2019, 8:35 AM   Clinical Narrative:    Patient active with [facility] home health for PT/OT. Resumption of care orders placed.  Patient received all necessary DME last admission.   Final next level of care: [place] Barriers to Discharge: No Barriers Identified   Patient Goals and CMS Choice Patient states their goals for this hospitalization and ongoing recovery are:: to get home CMS Medicare.gov Compare Post Acute Care list provided to:: Patient Choice offered to / list presented to : Patient   Discharge Plan and Services      HH Arranged: PT, OT [facility] Agency: [place] Date [place]: 07/21/19 Time [place]: [phone] Representative spoke with at [place]: Adela Lank

## 2019-08-21 ENCOUNTER — Other Ambulatory Visit: Payer: Self-pay

## 2019-08-21 ENCOUNTER — Ambulatory Visit (INDEPENDENT_AMBULATORY_CARE_PROVIDER_SITE_OTHER): Payer: BC Managed Care – PPO | Admitting: Orthopedic Surgery

## 2019-08-21 ENCOUNTER — Encounter: Payer: Self-pay | Admitting: Orthopedic Surgery

## 2019-08-21 DIAGNOSIS — S81802A Unspecified open wound, left lower leg, initial encounter: Secondary | ICD-10-CM

## 2019-08-21 NOTE — Progress Notes (Addendum)
Office Visit Note   Patient: Ana Freeman           Date of Birth: 09/16/1971           MRN: [id number] Visit Date: 08/21/2019              Requested by: Bartholome Bill, [address] [place],  [place] [address] PCP: Bartholome Bill, MD  Chief Complaint  Patient presents with  . Left Ankle - Wound Check      HPI: Patient is a 48 year old woman status post multiple trauma from a fall with a open left ankle pilon fracture.  Patient is seen for initial evaluation for the traumatic medial malleolar wound.  Assessment & Plan: Visit Diagnoses:  1. Traumatic open wound of left lower leg, initial encounter     Plan: Patient has good pulses she has a wound bed with granulation tissue.  We will start her on the medical compression stockings she will wear the size medium sock around-the-clock wear the sleeve on top during waking hours she will continue with elevation continue with range of motion of her ankle to help the calf pump decrease the swelling wash with soap and water.  Follow-Up Instructions: Return in about 2 weeks (around 09/04/2019).   Ortho Exam  Patient is alert, oriented, no adenopathy, well-dressed, normal affect, normal respiratory effort. Examination patient has venous swelling of the left lower extremity she has a traumatic medial malleolar wound that is 3 cm in diameter that has 50% granulation tissue 50% fibrous exudative tissue.  Doppler was used and she has a very strong biphasic dorsalis pedis and posterior tibial pulse she has pitting edema of the calf and her calf measures 35 cm in circumference.  Imaging: No results found.   Labs: Lab Results  Component Value Date   HGBA1C 5.4 10/27/2015     Lab Results  Component Value Date   ALBUMIN 3.5 07/05/2019   ALBUMIN 4.4 10/05/2018   ALBUMIN 4.1 12/10/2016    Lab Results  Component Value Date   MG 1.9 07/09/2019   Lab Results  Component Value Date   VD25OH 20.68  (L) 12/10/2016   VD25OH 36 01/17/2015    No results found for: PREALBUMIN CBC EXTENDED Latest Ref Rng & Units 07/21/2019 07/20/2019 07/20/2019  WBC 4.0 - 10.5 K/uL 6.3 - 8.8  RBC 3.87 - 5.11 MIL/uL 3.64(L) - 3.17(L)  HGB 12.0 - 15.0 g/dL 8.1(L) 8.6(L) 6.7(LL)  HCT 36.0 - 46.0 % 27.5(L) 28.7(L) 23.3(L)  PLT 150 - 400 K/uL 532(H) - 538(H)     There is no height or weight on file to calculate BMI.  Orders:  No orders of the defined types were placed in this encounter.  No orders of the defined types were placed in this encounter.    Procedures: No procedures performed  Clinical Data: No additional findings.  ROS:  All other systems negative, except as noted in the HPI. Review of Systems  Objective: Vital Signs: There were no vitals taken for this visit.  Specialty Comments:  No specialty comments available.  PMFS History: Patient Active Problem List   Diagnosis Date Noted  . Type III open pilon fracture of left tibia 07/19/2019  . Acute stress disorder   . Nondisplaced fracture of posterior process of right talus 07/09/2019  . Open pilon fracture, left, type III, initial encounter 07/06/2019  . Trimalleolar fracture of ankle, closed, right, initial encounter 07/06/2019  . Fall from  height of greater than 3 feet 07/06/2019  . Closed compression fracture of body of L1 vertebra ([place]) 07/06/2019  . Open ankle fracture 07/05/2019  . Hyperlipidemia 07/19/2018  . Atypical chest pain 07/19/2018  . GERD (gastroesophageal reflux disease) 10/28/2015  . Nonspecific abnormal electrocardiogram (ECG) (EKG) 02/11/2015  . Fibroid uterus 01/17/2015  . Anemia, iron deficiency 12/20/2014   Past Medical History:  Diagnosis Date  . Anemia   . GERD (gastroesophageal reflux disease)   . Headache   . Hyperlipidemia 07/19/2018  . Numbness of lower extremity   . Parathyroid abnormality (HCC)     Family History  Problem Relation Age of Onset  . Hypertension Mother   . Diabetes Father    . Congenital heart disease Father   . Anemia Sister   . Lung cancer Maternal Grandmother   . Ovarian cancer Paternal Grandmother   . Alzheimer's disease Paternal Grandfather     Past Surgical History:  Procedure Laterality Date  . ANKLE CLOSED REDUCTION Right 07/05/2019   Procedure: Closed Reduction Ankle;  Surgeon: Hiram Gash, MD;  Location: [facility];  Service: Orthopedics;  Laterality: Right;  . COLONOSCOPY    . DILATION AND EVACUATION N/A 02/12/2016   Procedure: DILATATION AND EVACUATION WITH SUCTION ;  Surgeon: Eldred Manges, MD;  Location: [facility];  Service: Gynecology;  Laterality: N/A;  . EXTERNAL FIXATION LEG Left 07/05/2019   Procedure: EXTERNAL FIXATION LEFT ANKLE;  Surgeon: Hiram Gash, MD;  Location: [facility];  Service: Orthopedics;  Laterality: Left;  . EXTERNAL FIXATION REMOVAL Left 07/19/2019   Procedure: REMOVAL EXTERNAL FIXATION LEG;  Surgeon: Shona Needles, MD;  Location: [facility];  Service: Orthopedics;  Laterality: Left;  . HYSTEROSCOPY WITH RESECTOSCOPE N/A 02/12/2016   Procedure: HYSTEROSCOPIC MYOMECTOMY WITH RESECTOSCOPE;  Surgeon: Eldred Manges, MD;  Location: [facility];  Service: Gynecology;  Laterality: N/A;  . I&D EXTREMITY Left 07/05/2019   Procedure: IRRIGATION AND DEBRIDEMENT left ankle ;  Surgeon: Hiram Gash, MD;  Location: [facility];  Service: Orthopedics;  Laterality: Left;  . I&D EXTREMITY Left 07/08/2019   Procedure: IRRIGATION AND DEBRIDEMENT EXTREMITY WITH ADJUSTMENT OF EXTERNAL FIXATOR;  Surgeon: Shona Needles, MD;  Location: [facility];  Service: Orthopedics;  Laterality: Left;  Marland Kitchen MASS EXCISION Right 08/30/2017   Procedure: EXCISION RIGHT UPPER ARM MASS;  Surgeon: Coralie Keens, MD;  [facility];  Service: General;  Laterality: Right;  . MYOMECTOMY N/A 02/12/2016   Procedure: ABDOMINAL MYOMECTOMY;  Surgeon: Eldred Manges, MD;  Location: [facility];  Service: Gynecology;  Laterality: N/A;  . OPEN REDUCTION INTERNAL FIXATION (ORIF)  TIBIA/FIBULA FRACTURE Left 07/19/2019   Procedure: OPEN REDUCTION INTERNAL FIXATION (ORIF) LEFT PILON;  Surgeon: Shona Needles, MD;  Location: [facility];  Service: Orthopedics;  Laterality: Left;  . ORIF ANKLE FRACTURE Right 07/08/2019   Procedure: OPEN REDUCTION INTERNAL FIXATION (ORIF) ANKLE FRACTURE;  Surgeon: Shona Needles, MD;  Location: [facility];  Service: Orthopedics;  Laterality: Right;  . UPPER GI ENDOSCOPY    . WISDOM TOOTH EXTRACTION     Social History   Occupational History  . Not on file  Tobacco Use  . Smoking status: Never Smoker  . Smokeless tobacco: Never Used  Substance and Sexual Activity  . Alcohol use: Yes    Alcohol/week: 0.0 standard drinks    Comment: occ  . Drug use: Never  . Sexual activity: Yes    Birth control/protection: Abstinence

## 2019-09-04 ENCOUNTER — Encounter: Payer: Self-pay | Admitting: Orthopedic Surgery

## 2019-09-04 ENCOUNTER — Ambulatory Visit (INDEPENDENT_AMBULATORY_CARE_PROVIDER_SITE_OTHER): Payer: BC Managed Care – PPO | Admitting: Orthopedic Surgery

## 2019-09-04 VITALS — Ht 64.0 in | Wt 167.0 lb

## 2019-09-04 DIAGNOSIS — S81802A Unspecified open wound, left lower leg, initial encounter: Secondary | ICD-10-CM | POA: Diagnosis not present

## 2019-09-04 NOTE — Progress Notes (Signed)
Office Visit Note   Patient: Ana Freeman           Date of Birth: [birth year]           MRN: [id number] Visit Date: 09/04/2019              Requested by: Bartholome Bill, [address],  [place] [address] PCP: Bartholome Bill, MD  Chief Complaint  Patient presents with  . Left Ankle - Routine Post Op    07/19/2019 ORIF left Pilon      HPI: Patient is a 48 year old woman status post open pilon fracture left ankle.  Patient is been wearing medical compression stockings she states she does have some swelling pain at night when she takes off the sleeve states she has been elevating her leg.  Assessment & Plan: Visit Diagnoses:  1. Traumatic open wound of left lower leg, initial encounter     Plan: Patient will continue with the compression sock during the day and sleeve with wearing the sock only at night she will begin weightbearing as instructed by Dr. Doreatha Martin follow-up in 4 weeks for reevaluation.  Follow-Up Instructions: Return in about 4 weeks (around 10/02/2019).   Ortho Exam  Patient is alert, oriented, no adenopathy, well-dressed, normal affect, normal respiratory effort. Examination patient shows continued improvement of the traumatic wound there is approximately 75% granulation tissue there is superficial epithelialization around the wound edges.  A little bit of scab was removed and this had good epithelization beneath the scab.  There is no cellulitis no odor no drainage no signs of infection.  Imaging: No results found.   Labs: Lab Results  Component Value Date   HGBA1C 5.4 10/27/2015     Lab Results  Component Value Date   ALBUMIN 3.5 07/05/2019   ALBUMIN 4.4 10/05/2018   ALBUMIN 4.1 12/10/2016    Lab Results  Component Value Date   MG 1.9 07/09/2019   Lab Results  Component Value Date   VD25OH 20.68 (L) 12/10/2016   VD25OH 36 01/17/2015    No results found for: PREALBUMIN CBC EXTENDED Latest Ref Rng &  Units 07/21/2019 07/20/2019 07/20/2019  WBC 4.0 - 10.5 K/uL 6.3 - 8.8  RBC 3.87 - 5.11 MIL/uL 3.64(L) - 3.17(L)  HGB 12.0 - 15.0 g/dL 8.1(L) 8.6(L) 6.7(LL)  HCT 36.0 - 46.0 % 27.5(L) 28.7(L) 23.3(L)  PLT 150 - 400 K/uL 532(H) - 538(H)     Body mass index is 28.67 kg/m.  Orders:  No orders of the defined types were placed in this encounter.  No orders of the defined types were placed in this encounter.    Procedures: No procedures performed  Clinical Data: No additional findings.  ROS:  All other systems negative, except as noted in the HPI. Review of Systems  Objective: Vital Signs: Ht 5\' 4"  (1.626 m)   Wt 167 lb (75.8 kg)   BMI 28.67 kg/m   Specialty Comments:  No specialty comments available.  PMFS History: Patient Active Problem List   Diagnosis Date Noted  . Type III open pilon fracture of left tibia 07/19/2019  . Acute stress disorder   . Nondisplaced fracture of posterior process of right talus 07/09/2019  . Open pilon fracture, left, type III, initial encounter 07/06/2019  . Trimalleolar fracture of ankle, closed, right, initial encounter 07/06/2019  . Fall from height of greater than 3 feet 07/06/2019  . Closed compression fracture of body of L1 vertebra (HCC)  07/06/2019  . Open ankle fracture 07/05/2019  . Hyperlipidemia 07/19/2018  . Atypical chest pain 07/19/2018  . GERD (gastroesophageal reflux disease) 10/28/2015  . Nonspecific abnormal electrocardiogram (ECG) (EKG) 02/11/2015  . Fibroid uterus 01/17/2015  . Anemia, iron deficiency 12/20/2014   Past Medical History:  Diagnosis Date  . Anemia   . GERD (gastroesophageal reflux disease)   . Headache   . Hyperlipidemia 07/19/2018  . Numbness of lower extremity   . Parathyroid abnormality (HCC)     Family History  Problem Relation Age of Onset  . Hypertension Mother   . Diabetes Father   . Congenital heart disease Father   . Anemia Sister   . Lung cancer Maternal Grandmother   . Ovarian cancer  Paternal Grandmother   . Alzheimer's disease Paternal Grandfather     Past Surgical History:  Procedure Laterality Date  . ANKLE CLOSED REDUCTION Right 07/05/2019   Procedure: Closed Reduction Ankle;  Surgeon: Hiram Gash, MD;  Location: [facility];  Service: Orthopedics;  Laterality: Right;  . COLONOSCOPY    . DILATION AND EVACUATION N/A 02/12/2016   Procedure: DILATATION AND EVACUATION WITH SUCTION ;  Surgeon: Eldred Manges, MD;  Location: [facility];  Service: Gynecology;  Laterality: N/A;  . EXTERNAL FIXATION LEG Left 07/05/2019   Procedure: EXTERNAL FIXATION LEFT ANKLE;  Surgeon: Hiram Gash, MD;  Location: [facility];  Service: Orthopedics;  Laterality: Left;  . EXTERNAL FIXATION REMOVAL Left 07/19/2019   Procedure: REMOVAL EXTERNAL FIXATION LEG;  Surgeon: Shona Needles, MD;  Location: [facility];  Service: Orthopedics;  Laterality: Left;  . HYSTEROSCOPY WITH RESECTOSCOPE N/A 02/12/2016   Procedure: HYSTEROSCOPIC MYOMECTOMY WITH RESECTOSCOPE;  Surgeon: Eldred Manges, MD;  Location: [facility];  Service: Gynecology;  Laterality: N/A;  . I&D EXTREMITY Left 07/05/2019   Procedure: IRRIGATION AND DEBRIDEMENT left ankle ;  Surgeon: Hiram Gash, MD;  Location: [facility];  Service: Orthopedics;  Laterality: Left;  . I&D EXTREMITY Left 07/08/2019   Procedure: IRRIGATION AND DEBRIDEMENT EXTREMITY WITH ADJUSTMENT OF EXTERNAL FIXATOR;  Surgeon: Shona Needles, MD;  Location: [facility];  Service: Orthopedics;  Laterality: Left;  Marland Kitchen MASS EXCISION Right 08/30/2017   Procedure: EXCISION RIGHT UPPER ARM MASS;  Surgeon: Coralie Keens, MD;  Location: [facility];  Service: General;  Laterality: Right;  . MYOMECTOMY N/A 02/12/2016   Procedure: ABDOMINAL MYOMECTOMY;  Surgeon: Eldred Manges, MD;  Location: [facility];  Service: Gynecology;  Laterality: N/A;  . OPEN REDUCTION INTERNAL FIXATION (ORIF) TIBIA/FIBULA FRACTURE Left 07/19/2019   Procedure: OPEN REDUCTION INTERNAL FIXATION (ORIF) LEFT PILON;  Surgeon: Shona Needles, MD;  Location: [facility];  Service: Orthopedics;  Laterality: Left;  . ORIF ANKLE FRACTURE Right 07/08/2019   Procedure: OPEN REDUCTION INTERNAL FIXATION (ORIF) ANKLE FRACTURE;  Surgeon: Shona Needles, MD;  Location: [facility];  Service: Orthopedics;  Laterality: Right;  . UPPER GI ENDOSCOPY    . WISDOM TOOTH EXTRACTION     Social History   Occupational History  . Not on file  Tobacco Use  . Smoking status: Never Smoker  . Smokeless tobacco: Never Used  Substance and Sexual Activity  . Alcohol use: Yes    Alcohol/week: 0.0 standard drinks    Comment: occ  . Drug use: Never  . Sexual activity: Yes    Birth control/protection: Abstinence

## 2019-10-02 ENCOUNTER — Ambulatory Visit (INDEPENDENT_AMBULATORY_CARE_PROVIDER_SITE_OTHER): Payer: BC Managed Care – PPO | Admitting: Orthopedic Surgery

## 2019-10-02 ENCOUNTER — Other Ambulatory Visit: Payer: Self-pay

## 2019-10-02 VITALS — Ht 64.0 in | Wt 167.0 lb

## 2019-10-02 DIAGNOSIS — S81802A Unspecified open wound, left lower leg, initial encounter: Secondary | ICD-10-CM

## 2019-10-03 ENCOUNTER — Encounter: Payer: Self-pay | Admitting: Orthopedic Surgery

## 2019-10-03 NOTE — Progress Notes (Signed)
Office Visit Note   Patient: Ana Freeman           Date of Birth: Aug 22, 1971           MRN: [id number] Visit Date: 10/02/2019              Requested by: Bartholome Bill, [address] [place],  [place] [address] PCP: Bartholome Bill, MD  Chief Complaint  Patient presents with  . Left Ankle - Follow-up      HPI: Patient is a 47 year old woman who presents in follow-up status post internal fixation for open ankle fracture dislocation.  Patient is followed by Dr. Doreatha Martin for the ankle fracture.  She presents in follow-up for the traumatic wound.  She is currently wearing a medical compression stocking she denies any drainage she states she is taking doxycycline.  Patient states she has numbness inferior to the wound as well as numbness plantar laterally as well as dorsally of the foot.  She is currently using a cam walker.   Assessment & Plan: Visit Diagnoses:  1. Traumatic open wound of left lower leg, initial encounter     Plan: Plan: Recommend that she continue with the compression sock she will advance her weightbearing as per Dr. Doreatha Martin.  Follow-Up Instructions: Return in about 4 weeks (around 10/30/2019).   Ortho Exam  Patient is alert, oriented, no adenopathy, well-dressed, normal affect, normal respiratory effort. Examination patient has a strong dorsalis pedis pulse the traumatic wound has healed nicely.  There is 1 small area of a scab approximately 5 mm in diameter.  Imaging: No results found.   Labs: Lab Results  Component Value Date   HGBA1C 5.4 10/27/2015     Lab Results  Component Value Date   ALBUMIN 3.5 07/05/2019   ALBUMIN 4.4 10/05/2018   ALBUMIN 4.1 12/10/2016    Lab Results  Component Value Date   MG 1.9 07/09/2019   Lab Results  Component Value Date   VD25OH 20.68 (L) 12/10/2016   VD25OH 36 01/17/2015    No results found for: PREALBUMIN CBC EXTENDED Latest Ref Rng & Units 07/21/2019 07/20/2019 07/20/2019   WBC 4.0 - 10.5 K/uL 6.3 - 8.8  RBC 3.87 - 5.11 MIL/uL 3.64(L) - 3.17(L)  HGB 12.0 - 15.0 g/dL 8.1(L) 8.6(L) 6.7(LL)  HCT 36.0 - 46.0 % 27.5(L) 28.7(L) 23.3(L)  PLT 150 - 400 K/uL 532(H) - 538(H)     Body mass index is 28.67 kg/m.  Orders:  No orders of the defined types were placed in this encounter.  No orders of the defined types were placed in this encounter.    Procedures: No procedures performed  Clinical Data: No additional findings.  ROS:  All other systems negative, except as noted in the HPI. Review of Systems  Objective: Vital Signs: Ht 5\' 4"  (1.626 m)   Wt 167 lb (75.8 kg)   BMI 28.67 kg/m   Specialty Comments:  No specialty comments available.  PMFS History: Patient Active Problem List   Diagnosis Date Noted  . Type III open pilon fracture of left tibia 07/19/2019  . Acute stress disorder   . Nondisplaced fracture of posterior process of right talus 07/09/2019  . Open pilon fracture, left, type III, initial encounter 07/06/2019  . Trimalleolar fracture of ankle, closed, right, initial encounter 07/06/2019  . Fall from height of greater than 3 feet 07/06/2019  . Closed compression fracture of body of L1 vertebra ([place]) 07/06/2019  .  Open ankle fracture 07/05/2019  . Hyperlipidemia 07/19/2018  . Atypical chest pain 07/19/2018  . GERD (gastroesophageal reflux disease) 10/28/2015  . Nonspecific abnormal electrocardiogram (ECG) (EKG) 02/11/2015  . Fibroid uterus 01/17/2015  . Anemia, iron deficiency 12/20/2014   Past Medical History:  Diagnosis Date  . Anemia   . GERD (gastroesophageal reflux disease)   . Headache   . Hyperlipidemia 07/19/2018  . Numbness of lower extremity   . Parathyroid abnormality (HCC)     Family History  Problem Relation Age of Onset  . Hypertension Mother   . Diabetes Father   . Congenital heart disease Father   . Anemia Sister   . Lung cancer Maternal Grandmother   . Ovarian cancer Paternal Grandmother   .  Alzheimer's disease Paternal Grandfather     Past Surgical History:  Procedure Laterality Date  . ANKLE CLOSED REDUCTION Right 07/05/2019   Procedure: Closed Reduction Ankle;  Surgeon: Hiram Gash, MD;  Location: [facility];  Service: Orthopedics;  Laterality: Right;  . COLONOSCOPY    . DILATION AND EVACUATION N/A 02/12/2016   Procedure: DILATATION AND EVACUATION WITH SUCTION ;  Surgeon: Eldred Manges, MD;  Location: [facility];  Service: Gynecology;  Laterality: N/A;  . EXTERNAL FIXATION LEG Left 07/05/2019   Procedure: EXTERNAL FIXATION LEFT ANKLE;  Surgeon: Hiram Gash, MD;  Location: [facility];  Service: Orthopedics;  Laterality: Left;  . EXTERNAL FIXATION REMOVAL Left 07/19/2019   Procedure: REMOVAL EXTERNAL FIXATION LEG;  Surgeon: Shona Needles, MD;  Location: [facility];  Service: Orthopedics;  Laterality: Left;  . HYSTEROSCOPY WITH RESECTOSCOPE N/A 02/12/2016   Procedure: HYSTEROSCOPIC MYOMECTOMY WITH RESECTOSCOPE;  Surgeon: Eldred Manges, MD;  Location: [facility];  Service: Gynecology;  Laterality: N/A;  . I&D EXTREMITY Left 07/05/2019   Procedure: IRRIGATION AND DEBRIDEMENT left ankle ;  Surgeon: Hiram Gash, MD;  Location: [facility];  Service: Orthopedics;  Laterality: Left;  . I&D EXTREMITY Left 07/08/2019   Procedure: IRRIGATION AND DEBRIDEMENT EXTREMITY WITH ADJUSTMENT OF EXTERNAL FIXATOR;  Surgeon: Shona Needles, MD;  Location: [facility];  Service: Orthopedics;  Laterality: Left;  Marland Kitchen MASS EXCISION Right 08/30/2017   Procedure: EXCISION RIGHT UPPER ARM MASS;  Surgeon: Coralie Keens, MD;  Location: [facility];  Service: General;  Laterality: Right;  . MYOMECTOMY N/A 02/12/2016   Procedure: ABDOMINAL MYOMECTOMY;  Surgeon: Eldred Manges, MD;  Location: [facility];  Service: Gynecology;  Laterality: N/A;  . OPEN REDUCTION INTERNAL FIXATION (ORIF) TIBIA/FIBULA FRACTURE Left 07/19/2019   Procedure: OPEN REDUCTION INTERNAL FIXATION (ORIF) LEFT PILON;  Surgeon: Shona Needles, MD;  Location:  [facility];  Service: Orthopedics;  Laterality: Left;  . ORIF ANKLE FRACTURE Right 07/08/2019   Procedure: OPEN REDUCTION INTERNAL FIXATION (ORIF) ANKLE FRACTURE;  Surgeon: Shona Needles, MD;  Location: [facility];  Service: Orthopedics;  Laterality: Right;  . UPPER GI ENDOSCOPY    . WISDOM TOOTH EXTRACTION     Social History   Occupational History  . Not on file  Tobacco Use  . Smoking status: Never Smoker  . Smokeless tobacco: Never Used  Substance and Sexual Activity  . Alcohol use: Yes    Alcohol/week: 0.0 standard drinks    Comment: occ  . Drug use: Never  . Sexual activity: Yes    Birth control/protection: Abstinence

## 2019-10-09 ENCOUNTER — Other Ambulatory Visit: Payer: Self-pay | Admitting: Obstetrics & Gynecology

## 2019-10-17 ENCOUNTER — Encounter (HOSPITAL_BASED_OUTPATIENT_CLINIC_OR_DEPARTMENT_OTHER): Payer: Self-pay | Admitting: *Deleted

## 2019-10-17 ENCOUNTER — Other Ambulatory Visit: Payer: Self-pay

## 2019-10-20 ENCOUNTER — Other Ambulatory Visit (HOSPITAL_COMMUNITY)
Admission: RE | Admit: 2019-10-20 | Discharge: 2019-10-20 | Disposition: A | Discharge status: Home / Self Care | Payer: BC Managed Care – PPO | Source: Ambulatory Visit | Attending: Obstetrics & Gynecology | Admitting: Obstetrics & Gynecology

## 2019-10-20 DIAGNOSIS — Z01812 Encounter for preprocedural laboratory examination: Secondary | ICD-10-CM | POA: Diagnosis not present

## 2019-10-20 DIAGNOSIS — Z20828 Contact with and (suspected) exposure to other viral communicable diseases: Secondary | ICD-10-CM | POA: Diagnosis not present

## 2019-10-21 LAB — NOVEL CORONAVIRUS, NAA (HOSP ORDER, SEND-OUT TO REF LAB; TAT 18-24 HRS): SARS-CoV-2, NAA: NOT DETECTED

## 2019-10-23 ENCOUNTER — Other Ambulatory Visit: Payer: Self-pay

## 2019-10-23 ENCOUNTER — Encounter (HOSPITAL_BASED_OUTPATIENT_CLINIC_OR_DEPARTMENT_OTHER)
Admission: RE | Admit: 2019-10-23 | Discharge: 2019-10-23 | Disposition: A | Discharge status: Home / Self Care | Payer: BC Managed Care – PPO | Source: Ambulatory Visit | Attending: Obstetrics & Gynecology | Admitting: Obstetrics & Gynecology

## 2019-10-23 DIAGNOSIS — Z01812 Encounter for preprocedural laboratory examination: Secondary | ICD-10-CM | POA: Insufficient documentation

## 2019-10-23 DIAGNOSIS — D250 Submucous leiomyoma of uterus: Secondary | ICD-10-CM | POA: Diagnosis not present

## 2019-10-23 DIAGNOSIS — N920 Excessive and frequent menstruation with regular cycle: Secondary | ICD-10-CM | POA: Diagnosis not present

## 2019-10-23 DIAGNOSIS — D259 Leiomyoma of uterus, unspecified: Secondary | ICD-10-CM | POA: Diagnosis present

## 2019-10-23 LAB — CBC
HCT: 34.7 % — ABNORMAL LOW (ref 36.0–46.0)
Hemoglobin: 10.1 g/dL — ABNORMAL LOW (ref 12.0–15.0)
MCH: 22.1 pg — ABNORMAL LOW (ref 26.0–34.0)
MCHC: 29.1 g/dL — ABNORMAL LOW (ref 30.0–36.0)
MCV: 76.1 fL — ABNORMAL LOW (ref 80.0–100.0)
Platelets: 406 10*3/uL — ABNORMAL HIGH (ref 150–400)
RBC: 4.56 MIL/uL (ref 3.87–5.11)
RDW: 16.6 % — ABNORMAL HIGH (ref 11.5–15.5)
WBC: 4.6 10*3/uL (ref 4.0–10.5)
nRBC: 0 % (ref 0.0–0.2)

## 2019-10-23 NOTE — H&P (Addendum)
Ana Freeman is an 48 y.o. female here for a Hysteroscopic resection of myoma for symptomatic fibroids causing heavy prolonged menses and anemia.  Pertinent Gynecological History: Menses: flow is excessive with use of many pads or tampons on heaviest days Bleeding: intermenstrual bleeding Contraception: Depo-Provera injections and depo DES exposure: unknown Blood transfusions: none Sexually transmitted diseases: no past history Previous GYN Procedures: abdominal myomectomy  Last mammogram: normal Date: 12/2017 Last pap: normal Date: 01/31/2015 OB History: G0, P0   Menstrual History:  Patient's last menstrual period was 09/02/2019 (approximate).    Past Medical History:  Diagnosis Date  . Anemia   . Brain lesion   . GERD (gastroesophageal reflux disease)   . Headache   . Hyperlipidemia 07/19/2018  . Numbness of lower extremity   . Parathyroid abnormality [facility])     Past Surgical History:  Procedure Laterality Date  . ANKLE CLOSED REDUCTION Right 07/05/2019   Procedure: Closed Reduction Ankle;  Surgeon: Hiram Gash, MD;  Location: [place];  Service: Orthopedics;  Laterality: Right;  . COLONOSCOPY    . DILATION AND EVACUATION N/A 02/12/2016   Procedure: DILATATION AND EVACUATION WITH SUCTION ;  Surgeon: Eldred Manges, MD;  Location: [place];  Service: Gynecology;  Laterality: N/A;  . EXTERNAL FIXATION LEG Left 07/05/2019   Procedure: EXTERNAL FIXATION LEFT ANKLE;  Surgeon: Hiram Gash, MD;  Location: [place];  Service: Orthopedics;  Laterality: Left;  . EXTERNAL FIXATION REMOVAL Left 07/19/2019   Procedure: REMOVAL EXTERNAL FIXATION LEG;  Surgeon: Shona Needles, MD;  Location: [place];  Service: Orthopedics;  Laterality: Left;  . HYSTEROSCOPY WITH RESECTOSCOPE N/A 02/12/2016   Procedure: HYSTEROSCOPIC MYOMECTOMY WITH RESECTOSCOPE;  Surgeon: Eldred Manges, MD;  Location: [place] [facility];  Service: Gynecology;  Laterality: N/A;  . I&D EXTREMITY Left 07/05/2019   Procedure: IRRIGATION AND  DEBRIDEMENT left ankle ;  Surgeon: Hiram Gash, MD;  Location: [place];  Service: Orthopedics;  Laterality: Left;  . I&D EXTREMITY Left 07/08/2019   Procedure: IRRIGATION AND DEBRIDEMENT EXTREMITY WITH ADJUSTMENT OF EXTERNAL FIXATOR;  Surgeon: Shona Needles, MD;  Location: [place];  Service: Orthopedics;  Laterality: Left;  Marland Kitchen MASS EXCISION Right 08/30/2017   Procedure: EXCISION RIGHT UPPER ARM MASS;  Surgeon: Coralie Keens, MD;  Location: [place];  Service: General;  Laterality: Right;  . MYOMECTOMY N/A 02/12/2016   Procedure: ABDOMINAL MYOMECTOMY;  Surgeon: Eldred Manges, MD;  Location: [place] [facility];  Service: Gynecology;  Laterality: N/A;  . OPEN REDUCTION INTERNAL FIXATION (ORIF) TIBIA/FIBULA FRACTURE Left 07/19/2019   Procedure: OPEN REDUCTION INTERNAL FIXATION (ORIF) LEFT PILON;  Surgeon: Shona Needles, MD;  Location: [place];  Service: Orthopedics;  Laterality: Left;  . ORIF ANKLE FRACTURE Right 07/08/2019   Procedure: OPEN REDUCTION INTERNAL FIXATION (ORIF) ANKLE FRACTURE;  Surgeon: Shona Needles, MD;  Location: [place];  Service: Orthopedics;  Laterality: Right;  . UPPER GI ENDOSCOPY    . WISDOM TOOTH EXTRACTION      Family History  Problem Relation Age of Onset  . Hypertension Mother   . Diabetes Father   . Congenital heart disease Father   . Anemia Sister   . Lung cancer Maternal Grandmother   . Ovarian cancer Paternal Grandmother   . Alzheimer's disease Paternal Grandfather     Social History:  reports that she has never smoked. She has never used smokeless tobacco. She reports current alcohol use. She reports that she does not use drugs.  Allergies:  No Known Allergies  No medications prior to admission.    ROS  Constitutional: Denies fevers/chills Cardiovascular: Denies chest pain or palpitations Pulmonary: Denies coughing or wheezing Gastrointestinal: Denies nausea, vomiting or diarrhea Genitourinary: Denies pelvic pain, unusual vaginal bleeding,  unusual vaginal discharge, dysuria, urgency or frequency.  Musculoskeletal: Denies muscle or joint aches and pain.  Neurology: Denies abnormal sensations such as tingling or numbness.   Height 5\' 4"  (1.626 m), weight 71.7 kg, last menstrual period 09/02/2019.  Blood pressure 120/68, pulse 80, temperature 99.1 F (37.3 C), temperature source Oral, resp. rate 16, height 5\' 4"  (1.626 m), weight 78.2 kg, last menstrual period 09/02/2019, SpO2 100 %. Physical Exam Constitutional: She is oriented to person, place, and time. She appears well-developed and well-nourished.  HENT:  Head: Normocephalic and atraumatic.  Neck: Normal range of motion.  Cardiovascular: Normal rate, regular rhythm and normal heart sounds.   Respiratory: Effort normal and breath sounds normal.  GI: Soft. Bowel sounds are normal.  Neurological: She is alert and oriented to person, place, and time.  Skin: Skin is warm and dry.  Psychiatric: She has a normal mood and affect. Her behavior is normal.  Results for orders placed or performed during the hospital encounter of 10/24/19 (from the past 24 hour(s))  CBC     Status: Abnormal   Collection Time: 10/23/19  9:00 AM  Result Value Ref Range   WBC 4.6 4.0 - 10.5 K/uL   RBC 4.56 3.87 - 5.11 MIL/uL   Hemoglobin 10.1 (L) 12.0 - 15.0 g/dL   HCT 34.7 (L) 36.0 - 46.0 %   MCV 76.1 (L) 80.0 - 100.0 fL   MCH 22.1 (L) 26.0 - 34.0 pg   MCHC 29.1 (L) 30.0 - 36.0 g/dL   RDW 16.6 (H) 11.5 - 15.5 %   Platelets 406 (H) 150 - 400 K/uL   nRBC 0.0 0.0 - 0.2 %   10/21/2019: COVID Testing negative.   10/23/19: Urine pregnancy test: Pending.   Pelvic ultrasound: 08/29/2019: Uterus 9cm with 2 submucosal fibroids 2.3 and 2.6 cm, 2 cm intramural fibroid. Normal adnexa bilaterally.  Assessment/Plan: 49 y/o P0 with symptomatic uterine fibroids here for hysteroscopic resection of submucosal fibroids.  Admit to Surgical center Npo IV fluids Sign consent form.   [name],  MD 10/23/2019, 11:24 PM

## 2019-10-23 NOTE — Progress Notes (Signed)
Unable to obtain POCT during lab visit, will obtain day of surgery.

## 2019-10-24 ENCOUNTER — Other Ambulatory Visit: Payer: Self-pay

## 2019-10-24 ENCOUNTER — Ambulatory Visit (HOSPITAL_BASED_OUTPATIENT_CLINIC_OR_DEPARTMENT_OTHER): Payer: BC Managed Care – PPO | Admitting: Certified Registered"

## 2019-10-24 ENCOUNTER — Encounter (HOSPITAL_BASED_OUTPATIENT_CLINIC_OR_DEPARTMENT_OTHER)
Admission: RE | Disposition: A | Discharge status: Home / Self Care | Payer: Self-pay | Source: Ambulatory Visit | Attending: Obstetrics & Gynecology

## 2019-10-24 ENCOUNTER — Encounter (HOSPITAL_BASED_OUTPATIENT_CLINIC_OR_DEPARTMENT_OTHER): Payer: Self-pay

## 2019-10-24 ENCOUNTER — Ambulatory Visit (HOSPITAL_BASED_OUTPATIENT_CLINIC_OR_DEPARTMENT_OTHER)
Admission: RE | Admit: 2019-10-24 | Discharge: 2019-10-24 | Disposition: A | Discharge status: Home / Self Care | Payer: BC Managed Care – PPO | Source: Ambulatory Visit | Attending: Obstetrics & Gynecology | Admitting: Obstetrics & Gynecology

## 2019-10-24 DIAGNOSIS — D250 Submucous leiomyoma of uterus: Secondary | ICD-10-CM | POA: Insufficient documentation

## 2019-10-24 DIAGNOSIS — N920 Excessive and frequent menstruation with regular cycle: Secondary | ICD-10-CM | POA: Diagnosis not present

## 2019-10-24 HISTORY — DX: Disorder of brain, unspecified: G93.9

## 2019-10-24 HISTORY — PX: DILATATION & CURETTAGE/HYSTEROSCOPY WITH MYOSURE: SHX6511

## 2019-10-24 LAB — BASIC METABOLIC PANEL
Anion gap: 6 (ref 5–15)
BUN: 6 mg/dL (ref 6–20)
CO2: 20 mmol/L — ABNORMAL LOW (ref 22–32)
Calcium: 9.9 mg/dL (ref 8.9–10.3)
Chloride: 114 mmol/L — ABNORMAL HIGH (ref 98–111)
Creatinine, Ser: 0.76 mg/dL (ref 0.44–1.00)
GFR calc Af Amer: >60 mL/min (ref >60)
GFR calc non Af Amer: >60 mL/min (ref >60)
Glucose, Bld: 90 mg/dL (ref 70–99)
Potassium: 4.6 mmol/L (ref 3.5–5.1)
Sodium: 140 mmol/L (ref 135–145)

## 2019-10-24 LAB — POCT PREGNANCY, URINE: Preg Test, Ur: NEGATIVE

## 2019-10-24 SURGERY — DILATATION & CURETTAGE/HYSTEROSCOPY WITH MYOSURE
Anesthesia: General | Site: Vagina

## 2019-10-24 MED ORDER — SODIUM CHLORIDE 0.9 % IR SOLN
Status: DC | PRN
  Administered 2019-10-24: 3000 mL

## 2019-10-24 MED ORDER — SILVER NITRATE-POT NITRATE 75-25 % EX MISC
CUTANEOUS | Status: DC | PRN
  Administered 2019-10-24: 2

## 2019-10-24 MED ORDER — FENTANYL CITRATE (PF) 100 MCG/2ML IJ SOLN
INTRAMUSCULAR | Status: AC
  Filled 2019-10-24: qty 2

## 2019-10-24 MED ORDER — MIDAZOLAM HCL 2 MG/2ML IJ SOLN
INTRAMUSCULAR | Status: AC
  Filled 2019-10-24: qty 2

## 2019-10-24 MED ORDER — FENTANYL CITRATE (PF) 100 MCG/2ML IJ SOLN
INTRAMUSCULAR | Status: DC | PRN
  Administered 2019-10-24: 25 ug via INTRAVENOUS
  Administered 2019-10-24: 50 ug via INTRAVENOUS
  Administered 2019-10-24: 25 ug via INTRAVENOUS

## 2019-10-24 MED ORDER — KETOROLAC TROMETHAMINE 30 MG/ML IJ SOLN: 30.0000 mg | Freq: Once | INTRAMUSCULAR | Status: DC | PRN

## 2019-10-24 MED ORDER — PROPOFOL 10 MG/ML IV BOLUS
INTRAVENOUS | Status: AC
  Filled 2019-10-24: qty 40

## 2019-10-24 MED ORDER — LIDOCAINE 2% (20 MG/ML) 5 ML SYRINGE
INTRAMUSCULAR | Status: DC | PRN
  Administered 2019-10-24: 40 mg via INTRAVENOUS

## 2019-10-24 MED ORDER — LIDOCAINE-EPINEPHRINE (PF) 1 %-1:200000 IJ SOLN
INTRAMUSCULAR | Status: AC
  Filled 2019-10-24: qty 30

## 2019-10-24 MED ORDER — ACETAMINOPHEN 160 MG/5ML PO SOLN: 325.0000 mg | ORAL | Status: DC | PRN

## 2019-10-24 MED ORDER — FENTANYL CITRATE (PF) 100 MCG/2ML IJ SOLN
25.0000 ug | INTRAMUSCULAR | Status: DC | PRN
  Administered 2019-10-24: 50 ug via INTRAVENOUS

## 2019-10-24 MED ORDER — MEPERIDINE HCL 25 MG/ML IJ SOLN: 6.2500 mg | INTRAMUSCULAR | Status: DC | PRN

## 2019-10-24 MED ORDER — ONDANSETRON HCL 4 MG/2ML IJ SOLN
INTRAMUSCULAR | Status: DC | PRN
  Administered 2019-10-24: 4 mg via INTRAVENOUS

## 2019-10-24 MED ORDER — PROPOFOL 500 MG/50ML IV EMUL
INTRAVENOUS | Status: DC | PRN
  Administered 2019-10-24: 100 ug/kg/min via INTRAVENOUS

## 2019-10-24 MED ORDER — ONDANSETRON HCL 4 MG/2ML IJ SOLN: 4.0000 mg | Freq: Once | INTRAMUSCULAR | Status: DC | PRN

## 2019-10-24 MED ORDER — LIDOCAINE-EPINEPHRINE 1 %-1:100000 IJ SOLN
INTRAMUSCULAR | Status: DC | PRN
  Administered 2019-10-24: 10 mL

## 2019-10-24 MED ORDER — OXYCODONE HCL 5 MG PO TABS
ORAL_TABLET | ORAL | Status: AC
  Filled 2019-10-24: qty 1

## 2019-10-24 MED ORDER — PROPOFOL 10 MG/ML IV BOLUS
INTRAVENOUS | Status: DC | PRN
  Administered 2019-10-24: 30 mg via INTRAVENOUS
  Administered 2019-10-24: 10 mg via INTRAVENOUS
  Administered 2019-10-24: 20 mg via INTRAVENOUS

## 2019-10-24 MED ORDER — LACTATED RINGERS IV SOLN
INTRAVENOUS | Status: DC
  Administered 2019-10-24: 08:00:00 via INTRAVENOUS

## 2019-10-24 MED ORDER — OXYCODONE HCL 5 MG/5ML PO SOLN: 5.0000 mg | Freq: Once | ORAL | Status: AC | PRN

## 2019-10-24 MED ORDER — OXYCODONE HCL 5 MG PO TABS
5.0000 mg | ORAL_TABLET | Freq: Once | ORAL | Status: AC | PRN
  Administered 2019-10-24: 5 mg via ORAL

## 2019-10-24 MED ORDER — LIDOCAINE-EPINEPHRINE 1 %-1:100000 IJ SOLN
INTRAMUSCULAR | Status: AC
  Filled 2019-10-24: qty 1

## 2019-10-24 MED ORDER — MIDAZOLAM HCL 5 MG/5ML IJ SOLN
INTRAMUSCULAR | Status: DC | PRN
  Administered 2019-10-24: 2 mg via INTRAVENOUS

## 2019-10-24 MED ORDER — ACETAMINOPHEN 325 MG PO TABS: 325.0000 mg | ORAL_TABLET | ORAL | Status: DC | PRN

## 2019-10-24 SURGICAL SUPPLY — 19 items
CATH ROBINSON RED A/P 16FR (CATHETERS) ×1 IMPLANT
DEVICE MYOSURE LITE (MISCELLANEOUS) IMPLANT
DEVICE MYOSURE REACH (MISCELLANEOUS) IMPLANT
GLOVE BIOGEL PI IND STRL 7.0 (GLOVE) ×2 IMPLANT
GLOVE BIOGEL PI IND STRL 8 (GLOVE) IMPLANT
GLOVE BIOGEL PI INDICATOR 7.0 (GLOVE) ×4
GLOVE BIOGEL PI INDICATOR 8 (GLOVE) ×2
GLOVE SURG SS PI 6.5 STRL IVOR (GLOVE) ×3 IMPLANT
GOWN STRL REUS W/ TWL LRG LVL3 (GOWN DISPOSABLE) ×2 IMPLANT
GOWN STRL REUS W/TWL LRG LVL3 (GOWN DISPOSABLE) ×6
KIT PROCEDURE FLUENT (KITS) ×3 IMPLANT
MYOSURE XL FIBROID (MISCELLANEOUS) ×3
PACK VAGINAL MINOR WOMEN LF (CUSTOM PROCEDURE TRAY) ×3 IMPLANT
PAD OB MATERNITY 4.3X12.25 (PERSONAL CARE ITEMS) ×3 IMPLANT
PAD PREP 24X48 CUFFED NSTRL (MISCELLANEOUS) ×2 IMPLANT
SEAL ROD LENS SCOPE MYOSURE (ABLATOR) ×3 IMPLANT
SYSTEM TISS REMOVAL MYOSURE XL (MISCELLANEOUS) IMPLANT
TOWEL GREEN STERILE FF (TOWEL DISPOSABLE) ×6 IMPLANT
UNDERPAD 30X36 HEAVY ABSORB (UNDERPADS AND DIAPERS) ×1 IMPLANT

## 2019-10-24 NOTE — Anesthesia Postprocedure Evaluation (Addendum)
Anesthesia Post Note  Patient: Ana Freeman  Procedure(s) Performed: DILATATION & CURETTAGE/HYSTEROSCOPY WITH MYOSURE (N/A Vagina )     Patient location during evaluation: Phase II Anesthesia Type: MAC Level of consciousness: awake Pain management: pain level controlled Vital Signs Assessment: post-procedure vital signs reviewed and stable Respiratory status: spontaneous breathing Cardiovascular status: stable Postop Assessment: no apparent nausea or vomiting Anesthetic complications: no    Last Vitals:  Vitals:   10/24/19 1202 10/24/19 1235  BP: 139/86 133/78  Pulse: 95 86  Resp: 19 20  Temp:  36.7 C  SpO2: 100% 100%    Last Pain:  Vitals:   10/24/19 1235  TempSrc: Oral  PainSc: 6    Pain Goal: Patients Stated Pain Goal: 3 (10/24/19 1134)                 Huston Foley

## 2019-10-24 NOTE — Brief Op Note (Signed)
10/24/2019  10:27 AM  PATIENT:  Ana Freeman  49 y.o. female  PRE-OPERATIVE DIAGNOSIS:  Symptomatic Fibroid  POST-OPERATIVE DIAGNOSIS:  Symptomatic Fibroid  PROCEDURE:  Procedure(s): DILATATION & CURETTAGE/HYSTEROSCOPY WITH MYOSURE (N/A)  SURGEON:  Surgeon(s) and Role:    Waymon Amato, MD - Primary  ASSISTANTS: None  ANESTHESIA:   IV sedation and paracervical block  EBL:  25 mL   FLUID DEFICIT: 2550 ml  BLOOD ADMINISTERED:none  DRAINS: none   LOCAL MEDICATIONS USED:  LIDOCAINE  and Amount: 10 ml  SPECIMEN:  Source of Specimen:  Endometrial currettings, Endometrial fibroids, endocervical currettings.   DISPOSITION OF SPECIMEN:  PATHOLOGY  COUNTS:  YES  TOURNIQUET:  * No tourniquets in log *  DICTATION: .Note written in [place]: Discharge to home after PACU  PATIENT DISPOSITION:  PACU - hemodynamically stable.   Delay start of Pharmacological VTE agent (>24hrs) due to surgical blood loss or risk of bleeding: not applicable  Dr. Alesia Richards.  10/24/2019

## 2019-10-24 NOTE — Addendum Note (Signed)
Addendum  created 10/24/19 1501 by Lyn Hollingshead, MD   Clinical Note Signed

## 2019-10-24 NOTE — Interval H&P Note (Signed)
History and Physical Interval Note:  10/24/2019 9:07 AM  Ana Freeman  has presented today for surgery, with the diagnosis of Symptomatic Fibroid.  The various methods of treatment have been discussed with the patient and family. After consideration of risks, benefits and other options for treatment, the patient has consented to  Procedure(s): [place] (N/A) as a surgical intervention.  The patient's history has been reviewed, patient examined, no change in status, stable for surgery.  I have reviewed the patient's chart and labs.  Questions were answered to the patient's satisfaction.     [name], MD.

## 2019-10-24 NOTE — Discharge Instructions (Signed)
Ms. Adriaunna, 1. Nothing in vagina x 2 weeks.  2. Expect some vaginal bleeding for next several days, call me if with excessive bleeding requiring you to change a pad every hour.  3.  Expect some abdominal cramping, take pain medication as needed.  Call me if pain is intolerable despite medication use. 4. I will see you in the office for your 2 weeks post procedure check.  Sincerely, Dr. Alesia Richards.     Post Anesthesia Home Care Instructions  Activity: Get plenty of rest for the remainder of the day. A responsible individual must stay with you for 24 hours following the procedure.  For the next 24 hours, DO NOT: -Drive a car -Paediatric nurse -Drink alcoholic beverages -Take any medication unless instructed by your physician -Make any legal decisions or sign important papers.  Meals: Start with liquid foods such as gelatin or soup. Progress to regular foods as tolerated. Avoid greasy, spicy, heavy foods. If nausea and/or vomiting occur, drink only clear liquids until the nausea and/or vomiting subsides. Call your physician if vomiting continues.  Special Instructions/Symptoms: Your throat may feel dry or sore from the anesthesia or the breathing tube placed in your throat during surgery. If this causes discomfort, gargle with warm salt water. The discomfort should disappear within 24 hours.  If you had a scopolamine patch placed behind your ear for the management of post- operative nausea and/or vomiting:  1. The medication in the patch is effective for 72 hours, after which it should be removed.  Wrap patch in a tissue and discard in the trash. Wash hands thoroughly with soap and water. 2. You may remove the patch earlier than 72 hours if you experience unpleasant side effects which may include dry mouth, dizziness or visual disturbances. 3. Avoid touching the patch. Wash your hands with soap and water after contact with the patch.

## 2019-10-24 NOTE — OR Nursing (Signed)
Dr. Alesia Richards notified of deficit at [place] of 591mls. Dr. Alesia Richards notified of deficit at [phone] of 1592mls.  Dr. Alesia Richards notified of deficit at [phone] of 236mls.  Dr. Alesia Richards notified of deficit at 0957 2445mls.  Anesthesia notified at 0957 of 2415mls deficit.  Dr. Jillyn Hidden entered OR room at [phone]. Final deficit of 2571mls.

## 2019-10-24 NOTE — Anesthesia Preprocedure Evaluation (Addendum)
Anesthesia Evaluation  Patient identified by MRN, date of birth, ID band Patient awake    Reviewed: Allergy & Precautions, NPO status , Patient's Chart, lab work & pertinent test results  Airway Mallampati: I       Dental no notable dental hx. (+) Teeth Intact   Pulmonary neg pulmonary ROS,    Pulmonary exam normal breath sounds clear to auscultation       Cardiovascular negative cardio ROS Normal cardiovascular exam Rhythm:Regular Rate:Normal     Neuro/Psych  Headaches,    GI/Hepatic Neg liver ROS,   Endo/Other  negative endocrine ROS  Renal/GU negative Renal ROS  negative genitourinary   Musculoskeletal negative musculoskeletal ROS (+)   Abdominal Normal abdominal exam  (+)   Peds  Hematology  (+) Blood dyscrasia, anemia ,   Anesthesia Other Findings   Reproductive/Obstetrics negative OB ROS                            Anesthesia Physical Anesthesia Plan  ASA: II  Anesthesia Plan: MAC   Post-op Pain Management:    Induction:   PONV Risk Score and Plan: 4 or greater and Ondansetron, Dexamethasone and Midazolam  Airway Management Planned: Nasal Cannula, Natural Airway and Simple Face Mask  Additional Equipment: None  Intra-op Plan:   Post-operative Plan:   Informed Consent: I have reviewed the patients History and Physical, chart, labs and discussed the procedure including the risks, benefits and alternatives for the proposed anesthesia with the patient or authorized representative who has indicated his/her understanding and acceptance.       Plan Discussed with: CRNA  Anesthesia Plan Comments:        Anesthesia Quick Evaluation

## 2019-10-24 NOTE — Op Note (Signed)
Ana Freeman DOB: 1971/03/02 MRN: [id number]  PREOP DIAGNOSIS:  1.Prolonged heavy periods: Menorrhagia  2.Fibroid uterus    Post operative diagnosis: Same as above  Procedure: Dilation and Currettage hysteroscopy with Myosure   Surgeon: Dr. Waymon Amato  Assistant: None  Anesthesia: IV sedation.  Paracervical block   Complications: None  Fluid deficit: 2550 cc  EBL: 20 cc  Indications: 48 year-old P0 with a history of fibroids and heavy prolonged periods here for hysteroscopic resection with myosure of submucosal lesions.   Procedure:   Informed consent was obtained from the patient to undergo the procedure including risks of bleeding, infection and  damage to organs.  She was taken to the operating room and anesthesia was administered without difficulty.  She was prepped and draped in the usual sterile fashion. She had voided before entering the OR.  An exam was performed under anesthesia revealing a small anteverted uterus and a closed cervix. There were no palpable adnexal masses.  A graves speculum was used to view the cervix. Single-tooth tenaculum was placed posteriorly on the cervix. 10cc of lidocaine/epinephrine was given as a paracervical block.  The cervix was dilated to #21 pratt dilators and hysteroscope advanced through the internal cervical os.  Myosure diagnostic scope was then placed to view the uterine cavity and the uterus was noted to have two fibroids in right anterior uterus near the ostium and another one on the left posterior wall.  The left ostium could be visualized.  The myosure XL was used to resect the fibroid on the left posterior uterus and then the ones on the right anterior uterus. About 50% of the fibroids on the right anterior uterus was resected at which point the procedure then had to be aborted due to reaching the fluid deficit limit.  On inquiring I was informed the uterine pressure on the Myosure machine had been set to 110 mmHg.  The Myosure was  removed. Polyp forceps was used to remove more fibroid lesion.  Endocervical and Endometrial curretings were obtained.  Myosure scope was placed in again and cavity visualized at 100 mmHg. Tubal ostium on right side could still not be visualized and there was small remnant fibroids on right anterior uterus.  The myosure and all instruments were then removed and the patient was awoken from anesthesia and taken to recovery room in stable condition.  Specimen: Endometrial lesion suspect fibroids and shavings, endometrial and endocervical currettings.   Disposition: Stable to PACU.   Dr. Alesia Richards. 10/24/2019.

## 2019-10-24 NOTE — Transfer of Care (Signed)
Immediate Anesthesia Transfer of Care Note  Patient: Ana Freeman  Procedure(s) Performed: DILATATION & CURETTAGE/HYSTEROSCOPY WITH MYOSURE (N/A Vagina )  Patient Location: PACU  Anesthesia Type:MAC  Level of Consciousness: awake, alert  and oriented  Airway & Oxygen Therapy: Patient Spontanous Breathing and Patient connected to face mask oxygen  Post-op Assessment: Report given to RN and Post -op Vital signs reviewed and stable  Post vital signs: Reviewed and stable  Last Vitals:  Vitals Value Taken Time  BP 117/79 10/24/19 1030  Temp    Pulse 109 10/24/19 1039  Resp 17 10/24/19 1039  SpO2 100 % 10/24/19 1039  Vitals shown include unvalidated device data.  Last Pain:  Vitals:   10/24/19 0816  TempSrc: Oral  PainSc: 0-No pain         Complications: No apparent anesthesia complications

## 2019-10-24 NOTE — Progress Notes (Signed)
Patient ready for discharge per Dr. Jillyn Hidden.

## 2019-10-25 ENCOUNTER — Encounter (HOSPITAL_BASED_OUTPATIENT_CLINIC_OR_DEPARTMENT_OTHER): Payer: Self-pay | Admitting: Obstetrics & Gynecology

## 2019-10-25 LAB — SURGICAL PATHOLOGY

## 2019-10-30 ENCOUNTER — Other Ambulatory Visit: Payer: Self-pay

## 2019-10-30 ENCOUNTER — Ambulatory Visit (INDEPENDENT_AMBULATORY_CARE_PROVIDER_SITE_OTHER): Payer: BC Managed Care – PPO | Admitting: Orthopedic Surgery

## 2019-10-30 ENCOUNTER — Encounter: Payer: Self-pay | Admitting: Orthopedic Surgery

## 2019-10-30 VITALS — Ht 64.0 in | Wt 172.0 lb

## 2019-10-30 DIAGNOSIS — S81802A Unspecified open wound, left lower leg, initial encounter: Secondary | ICD-10-CM

## 2019-10-30 NOTE — Progress Notes (Signed)
Office Visit Note   Patient: Ana Freeman           Date of Birth: 10-30-1971           MRN: [id number] Visit Date: 10/30/2019              Requested by: Bartholome Bill, [place],   [id number] PCP: Bartholome Bill, MD  Chief Complaint  Patient presents with  . Right Ankle - Follow-up    S/p open ankle fx dislocation and internal fixation with Dr. Doreatha Martin management of wound       HPI: Pleasant woman here in follow up for her ankle wound s/p ORIF open Ankle fracture Dislocation. She is pleased  With her progress . Working  With PT . Stiil has swelling. Wearing her VIVE Compression Sock  Assessment & Plan: Visit Diagnoses: No diagnosis found.  Plan: May follow up as needed continue Compression Sock  Follow-Up Instructions: No follow-ups on file.   Ortho Exam  Patient is alert, oriented, no adenopathy, well-dressed, normal affect, normal respiratory effort. Right Ankle: Wound has healed. No surrounding cellulitis. Distal CMS is intact. Ankle Flexion past neutral  Imaging: No results found.   Labs: Lab Results  Component Value Date   HGBA1C 5.4 10/27/2015     Lab Results  Component Value Date   ALBUMIN 3.5 07/05/2019   ALBUMIN 4.4 10/05/2018   ALBUMIN 4.1 12/10/2016    Lab Results  Component Value Date   MG 1.9 07/09/2019   Lab Results  Component Value Date   VD25OH 20.68 (L) 12/10/2016   VD25OH 36 01/17/2015    No results found for: PREALBUMIN CBC EXTENDED Latest Ref Rng & Units 10/23/2019 07/21/2019 07/20/2019  WBC 4.0 - 10.5 K/uL 4.6 6.3 -  RBC 3.87 - 5.11 MIL/uL 4.56 3.64(L) -  HGB 12.0 - 15.0 g/dL 10.1(L) 8.1(L) 8.6(L)  HCT 36.0 - 46.0 % 34.7(L) 27.5(L) 28.7(L)  PLT 150 - 400 K/uL 406(H) 532(H) -     Body mass index is 29.52 kg/m.  Orders:  No orders of the defined types were placed in this encounter.  No orders of the defined types were placed in this encounter.    Procedures: No  procedures performed  Clinical Data: No additional findings.  ROS:  All other systems negative, except as noted in the HPI. Review of Systems  Objective: Vital Signs: Ht 5\' 4"  (1.626 m)   Wt 172 lb (78 kg)   BMI 29.52 kg/m   Specialty Comments:  No specialty comments available.  PMFS History: Patient Active Problem List   Diagnosis Date Noted  . Type III open pilon fracture of left tibia 07/19/2019  . Acute stress disorder   . Nondisplaced fracture of posterior process of right talus 07/09/2019  . Open pilon fracture, left, type III, initial encounter 07/06/2019  . Trimalleolar fracture of ankle, closed, right, initial encounter 07/06/2019  . Fall from height of greater than 3 feet 07/06/2019  . Closed compression fracture of body of L1 vertebra ([place]) 07/06/2019  . Open ankle fracture 07/05/2019  . Hyperlipidemia 07/19/2018  . Atypical chest pain 07/19/2018  . GERD (gastroesophageal reflux disease) 10/28/2015  . Nonspecific abnormal electrocardiogram (ECG) (EKG) 02/11/2015  . Fibroid uterus 01/17/2015  . Anemia, iron deficiency 12/20/2014   Past Medical History:  Diagnosis Date  . Anemia   . Brain lesion   . GERD (gastroesophageal reflux disease)   . Headache   .  Hyperlipidemia 07/19/2018  . Numbness of lower extremity   . Parathyroid abnormality (HCC)     Family History  Problem Relation Age of Onset  . Hypertension Mother   . Diabetes Father   . Congenital heart disease Father   . Anemia Sister   . Lung cancer Maternal Grandmother   . Ovarian cancer Paternal Grandmother   . Alzheimer's disease Paternal Grandfather     Past Surgical History:  Procedure Laterality Date  . ANKLE CLOSED REDUCTION Right 07/05/2019   Procedure: Closed Reduction Ankle;  Surgeon: Hiram Gash, MD;  Location: [place];  Service: Orthopedics;  Laterality: Right;  . COLONOSCOPY    . DILATATION & CURETTAGE/HYSTEROSCOPY WITH MYOSURE N/A 10/24/2019   Procedure: DILATATION &  CURETTAGE/HYSTEROSCOPY WITH MYOSURE;  Surgeon: Waymon Amato, MD;  Location: [place];  Service: Gynecology;  Laterality: N/A;  . DILATION AND EVACUATION N/A 02/12/2016   Procedure: DILATATION AND EVACUATION WITH SUCTION ;  Surgeon: Eldred Manges, MD;  Location: [facility];  Service: Gynecology;  Laterality: N/A;  . EXTERNAL FIXATION LEG Left 07/05/2019   Procedure: EXTERNAL FIXATION LEFT ANKLE;  Surgeon: Hiram Gash, MD;  Location: [place];  Service: Orthopedics;  Laterality: Left;  . EXTERNAL FIXATION REMOVAL Left 07/19/2019   Procedure: REMOVAL EXTERNAL FIXATION LEG;  Surgeon: Shona Needles, MD;  Location: [place];  Service: Orthopedics;  Laterality: Left;  . HYSTEROSCOPY WITH RESECTOSCOPE N/A 02/12/2016   Procedure: HYSTEROSCOPIC MYOMECTOMY WITH RESECTOSCOPE;  Surgeon: Eldred Manges, MD;  Location: [facility];  Service: Gynecology;  Laterality: N/A;  . I&D EXTREMITY Left 07/05/2019   Procedure: IRRIGATION AND DEBRIDEMENT left ankle ;  Surgeon: Hiram Gash, MD;  Location: [place];  Service: Orthopedics;  Laterality: Left;  . I&D EXTREMITY Left 07/08/2019   Procedure: IRRIGATION AND DEBRIDEMENT EXTREMITY WITH ADJUSTMENT OF EXTERNAL FIXATOR;  Surgeon: Shona Needles, MD;  Location: [place];  Service: Orthopedics;  Laterality: Left;  Marland Kitchen MASS EXCISION Right 08/30/2017   Procedure: EXCISION RIGHT UPPER ARM MASS;  Surgeon: Coralie Keens, MD;  Location: [place];  Service: General;  Laterality: Right;  . MYOMECTOMY N/A 02/12/2016   Procedure: ABDOMINAL MYOMECTOMY;  Surgeon: Eldred Manges, MD;  Location: [facility];  Service: Gynecology;  Laterality: N/A;  . OPEN REDUCTION INTERNAL FIXATION (ORIF) TIBIA/FIBULA FRACTURE Left 07/19/2019   Procedure: OPEN REDUCTION INTERNAL FIXATION (ORIF) LEFT PILON;  Surgeon: Shona Needles, MD;  Location: [place];  Service: Orthopedics;  Laterality: Left;  . ORIF ANKLE FRACTURE Right 07/08/2019   Procedure: OPEN REDUCTION INTERNAL FIXATION (ORIF)  ANKLE FRACTURE;  Surgeon: Shona Needles, MD;  Location: [place];  Service: Orthopedics;  Laterality: Right;  . UPPER GI ENDOSCOPY    . WISDOM TOOTH EXTRACTION     Social History   Occupational History  . Not on file  Tobacco Use  . Smoking status: Never Smoker  . Smokeless tobacco: Never Used  Substance and Sexual Activity  . Alcohol use: Yes    Alcohol/week: 0.0 standard drinks    Comment: occ  . Drug use: Never  . Sexual activity: Yes    Birth control/protection: Injection

## 2019-11-27 LAB — HM MAMMOGRAPHY

## 2019-11-29 ENCOUNTER — Encounter: Payer: Self-pay | Admitting: Physical Therapy

## 2019-11-29 ENCOUNTER — Other Ambulatory Visit: Payer: Self-pay

## 2019-11-29 ENCOUNTER — Ambulatory Visit: Payer: BC Managed Care – PPO | Attending: Student | Admitting: Physical Therapy

## 2019-11-29 DIAGNOSIS — R600 Localized edema: Secondary | ICD-10-CM | POA: Diagnosis present

## 2019-11-29 DIAGNOSIS — R262 Difficulty in walking, not elsewhere classified: Secondary | ICD-10-CM | POA: Insufficient documentation

## 2019-11-29 DIAGNOSIS — M25672 Stiffness of left ankle, not elsewhere classified: Secondary | ICD-10-CM | POA: Insufficient documentation

## 2019-11-29 DIAGNOSIS — M6281 Muscle weakness (generalized): Secondary | ICD-10-CM

## 2019-11-29 DIAGNOSIS — M25572 Pain in left ankle and joints of left foot: Secondary | ICD-10-CM | POA: Insufficient documentation

## 2019-11-29 DIAGNOSIS — M25571 Pain in right ankle and joints of right foot: Secondary | ICD-10-CM

## 2019-11-29 DIAGNOSIS — M25671 Stiffness of right ankle, not elsewhere classified: Secondary | ICD-10-CM | POA: Insufficient documentation
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
## 2019-12-04 ENCOUNTER — Ambulatory Visit: Payer: BC Managed Care – PPO | Admitting: Physical Therapy

## 2019-12-04 ENCOUNTER — Other Ambulatory Visit: Payer: Self-pay

## 2019-12-04 DIAGNOSIS — R262 Difficulty in walking, not elsewhere classified: Secondary | ICD-10-CM

## 2019-12-04 DIAGNOSIS — M25571 Pain in right ankle and joints of right foot: Secondary | ICD-10-CM

## 2019-12-04 DIAGNOSIS — M25572 Pain in left ankle and joints of left foot: Secondary | ICD-10-CM

## 2019-12-04 DIAGNOSIS — M6281 Muscle weakness (generalized): Secondary | ICD-10-CM

## 2019-12-04 DIAGNOSIS — R600 Localized edema: Secondary | ICD-10-CM

## 2019-12-04 DIAGNOSIS — M25672 Stiffness of left ankle, not elsewhere classified: Secondary | ICD-10-CM

## 2019-12-04 DIAGNOSIS — M25671 Stiffness of right ankle, not elsewhere classified: Secondary | ICD-10-CM

## 2019-12-04 NOTE — Therapy (Signed)
[place], [place], [address] Phone: [phone]   Fax:  [phone]  Physical Therapy Treatment  Patient Details  Name: Ana Freeman MRN: [id number] Date of Birth: 1971/09/16 Referring Provider (PT): Katha Hamming, MD   Encounter Date: 12/04/2019  PT End of Session - 12/04/19 1132    Visit Number  2    Number of Visits  24    Date for PT Re-Evaluation  02/21/20    Authorization Type  BCBS    PT Start Time  1101    PT Stop Time  1143    PT Time Calculation (min)  42 min       Past Medical History:  Diagnosis Date  . Anemia   . Brain lesion   . GERD (gastroesophageal reflux disease)   . Headache   . Hyperlipidemia 07/19/2018  . Numbness of lower extremity   . Parathyroid abnormality [facility])     Past Surgical History:  Procedure Laterality Date  . ANKLE CLOSED REDUCTION Right 07/05/2019   Procedure: Closed Reduction Ankle;  Surgeon: Hiram Gash, MD;  Location: [place];  Service: Orthopedics;  Laterality: Right;  . COLONOSCOPY    . DILATATION & CURETTAGE/HYSTEROSCOPY WITH MYOSURE N/A 10/24/2019   Procedure: DILATATION & CURETTAGE/HYSTEROSCOPY WITH MYOSURE;  Surgeon: Waymon Amato, MD;  Location: [place];  Service: Gynecology;  Laterality: N/A;  . DILATION AND EVACUATION N/A 02/12/2016   Procedure: DILATATION AND EVACUATION WITH SUCTION ;  Surgeon: Eldred Manges, MD;  Location: [place] [facility];  Service: Gynecology;  Laterality: N/A;  . EXTERNAL FIXATION LEG Left 07/05/2019   Procedure: EXTERNAL FIXATION LEFT ANKLE;  Surgeon: Hiram Gash, MD;  Location: [place];  Service: Orthopedics;  Laterality: Left;  . EXTERNAL FIXATION REMOVAL Left 07/19/2019   Procedure: REMOVAL EXTERNAL FIXATION LEG;  Surgeon: Shona Needles, MD;  Location: [place];  Service: Orthopedics;  Laterality: Left;  . HYSTEROSCOPY WITH RESECTOSCOPE N/A 02/12/2016   Procedure: HYSTEROSCOPIC MYOMECTOMY WITH RESECTOSCOPE;  Surgeon:  Eldred Manges, MD;  Location: [place] [facility];  Service: Gynecology;  Laterality: N/A;  . I & D EXTREMITY Left 07/05/2019   Procedure: IRRIGATION AND DEBRIDEMENT left ankle ;  Surgeon: Hiram Gash, MD;  Location: [place];  Service: Orthopedics;  Laterality: Left;  . I & D EXTREMITY Left 07/08/2019   Procedure: IRRIGATION AND DEBRIDEMENT EXTREMITY WITH ADJUSTMENT OF EXTERNAL FIXATOR;  Surgeon: Shona Needles, MD;  Location: [place];  Service: Orthopedics;  Laterality: Left;  Marland Kitchen MASS EXCISION Right 08/30/2017   Procedure: EXCISION RIGHT UPPER ARM MASS;  Surgeon: Coralie Keens, MD;  Location: [place];  Service: General;  Laterality: Right;  . MYOMECTOMY N/A 02/12/2016   Procedure: ABDOMINAL MYOMECTOMY;  Surgeon: Eldred Manges, MD;  Location: [place] [facility];  Service: Gynecology;  Laterality: N/A;  . OPEN REDUCTION INTERNAL FIXATION (ORIF) TIBIA/FIBULA FRACTURE Left 07/19/2019   Procedure: OPEN REDUCTION INTERNAL FIXATION (ORIF) LEFT PILON;  Surgeon: Shona Needles, MD;  Location: [place];  Service: Orthopedics;  Laterality: Left;  . ORIF ANKLE FRACTURE Right 07/08/2019   Procedure: OPEN REDUCTION INTERNAL FIXATION (ORIF) ANKLE FRACTURE;  Surgeon: Shona Needles, MD;  Location: [place];  Service: Orthopedics;  Laterality: Right;  . UPPER GI ENDOSCOPY    . WISDOM TOOTH EXTRACTION      There were no vitals filed for this visit.  Subjective Assessment - 12/04/19 1105    Subjective  I still have difficulty  with initial stand and walk.    Currently in Pain?  No/denies    Pain Score  0-No pain    Pain Location  Ankle    Pain Orientation  Right    Pain Descriptors / Indicators  Discomfort    Pain Type  Acute pain;Surgical pain    Pain Score  0   up to 7/10   Pain Location  Ankle    Pain Orientation  Right;Left    Pain Descriptors / Indicators  [facility] Adult PT Treatment/Exercise - 12/04/19 0001      Self-Care   Self-Care  Heat/Ice Application     Heat/Ice Application  Elevate ankle above heart with and woithout ice to control edema      Lumbar Exercises: Supine   Pelvic Tilt  20 reps    Bent Knee Raise  20 reps    Bridge  10 reps      Ankle Exercises: Standing   Heel Raises  10 reps    Heel Raises Limitations  small lifts, bilat     Other Standing Ankle Exercises  3 way hip standing, marching , tandem stance     Other Standing Ankle Exercises  6 inch step up x 10, step stretch via lunge into DF       Ankle Exercises: Seated   Heel Raises  20 reps    Toe Raise  20 reps      Ankle Exercises: Stretches   Gastroc Stretch Limitations  runners stretch bilateral                PT Short Term Goals - 11/29/19 2017      PT SHORT TERM GOAL #1   Title  Independent with HEP    Baseline  updated today from home health HEP    Time  6    Period  Weeks    Status  New      PT SHORT TERM GOAL #2   Title  Independent community ambulation without further need for crutch use    Baseline  single crutch use    Time  6    Period  Weeks      PT SHORT TERM GOAL #3   Title  Increase left ankle DF AROM to at least neutral for improved toe clearance with gait    Baseline  -4 deg    Time  6    Period  Weeks    Status  New        PT Long Term Goals - 11/29/19 2018      PT LONG TERM GOAL #1   Title  Improve FOTO outcome measure score to 26% or less impairment    Baseline  41% limited    Time  12    Period  Weeks    Status  New    Target Date  02/21/20      PT LONG TERM GOAL #2   Title  Bilateral ankle strength 5/5 to improve ability for stair navigation at home and for ankle stability with outdoor ambulation over uneven surfaces    Baseline  Right side: EV 4-/5, IV 4/5, DF 4+/5, PF 4/5 (supine), left side: EV 4-/5, IV 4/5, DF 4/5, PF 4-/5 (supine)    Time  12    Period  Weeks    Status  New  Target Date  02/21/20      PT LONG TERM GOAL #3   Title  Tolerate community level ambulation with bilat. ankle pain <2/10 at  worst    Baseline  left 5/10 today, right 2/10 but intermittently higher pain    Time  12    Period  Weeks    Status  New    Target Date  02/21/20      PT LONG TERM GOAL #4   Title  Ascend/descend 15 steps to 2nd floor of home with reciprocal gait    Time  12    Period  Weeks    Status  New    Target Date  02/21/20            Plan - 12/04/19 1240    Clinical Impression Statement  Pt arrives with antlagic gait patteran and reports compliance with HEP. She is only performing theraband on left ankle, asked her to also perform on right. Instructed her in heel stike gait with equal step length. She was able to decrease her gait deviations with cues. Continued with review of HEP and progression to step ups and standing calf stretching. Discussed elevation above her heart to control edema. She is currently not elevating correctly.    PT Next Visit Plan  Recumbent bike, partial squats, pending pain try step up, balance/proprioception with Airex, marches, rocker board, core strengthening, ankle PROM focus left side, gastroc stretches,    PT Home Exercise Plan  ankle Therband 4-way, gastroc stretch longsitting with strap vs. standing if tolerated, standing hip 3-way SLR with Theraband, partial squat, Romberg on cushion, for core strengthening work pelvic tilts, supine marches and standing shoulder extension with Theraband       Patient will benefit from skilled therapeutic intervention in order to improve the following deficits and impairments:  Pain, Impaired flexibility, Decreased strength, Decreased activity tolerance, Increased edema, Decreased range of motion, Difficulty walking, Hypomobility, Decreased mobility, Decreased balance, Impaired sensation  Visit Diagnosis: Pain in left ankle and joints of left foot  Pain in right ankle and joints of right foot  Localized edema  Stiffness of left ankle, not elsewhere classified  Stiffness of right ankle, not elsewhere  classified  Difficulty in walking, not elsewhere classified  Muscle weakness (generalized)     Problem List Patient Active Problem List   Diagnosis Date Noted  . Type III open pilon fracture of left tibia 07/19/2019  . Acute stress disorder   . Nondisplaced fracture of posterior process of right talus 07/09/2019  . Open pilon fracture, left, type III, initial encounter 07/06/2019  . Trimalleolar fracture of ankle, closed, right, initial encounter 07/06/2019  . Fall from height of greater than 3 feet 07/06/2019  . Closed compression fracture of body of L1 vertebra ([place]) 07/06/2019  . Open ankle fracture 07/05/2019  . Hyperlipidemia 07/19/2018  . Atypical chest pain 07/19/2018  . GERD (gastroesophageal reflux disease) 10/28/2015  . Nonspecific abnormal electrocardiogram (ECG) (EKG) 02/11/2015  . Fibroid uterus 01/17/2015  . Anemia, iron deficiency 12/20/2014    Ana Freeman, PTA 12/04/2019, 12:45 PM  [facility] [address] [place], [place], [address] Phone: [phone]   Fax:  [phone]  Name: Ana Freeman MRN: [id number] Date of Birth: [birth year]

## 2019-12-12 ENCOUNTER — Encounter: Payer: Self-pay | Admitting: Physical Therapy

## 2019-12-12 ENCOUNTER — Other Ambulatory Visit: Payer: Self-pay

## 2019-12-12 ENCOUNTER — Ambulatory Visit: Payer: BC Managed Care – PPO | Admitting: Physical Therapy

## 2019-12-12 DIAGNOSIS — M25571 Pain in right ankle and joints of right foot: Secondary | ICD-10-CM

## 2019-12-12 DIAGNOSIS — M25572 Pain in left ankle and joints of left foot: Secondary | ICD-10-CM

## 2019-12-12 DIAGNOSIS — M25671 Stiffness of right ankle, not elsewhere classified: Secondary | ICD-10-CM

## 2019-12-12 DIAGNOSIS — R600 Localized edema: Secondary | ICD-10-CM

## 2019-12-12 DIAGNOSIS — M25672 Stiffness of left ankle, not elsewhere classified: Secondary | ICD-10-CM

## 2019-12-12 DIAGNOSIS — R262 Difficulty in walking, not elsewhere classified: Secondary | ICD-10-CM

## 2019-12-12 DIAGNOSIS — M6281 Muscle weakness (generalized): Secondary | ICD-10-CM

## 2019-12-12 NOTE — Therapy (Signed)
[place], [place], [address] Phone: [phone]   Fax:  [phone]  Physical Therapy Treatment  Patient Details  Name: Ana Freeman MRN: [id number] Date of Birth: 04-Apr-1971 Referring Provider (PT): Katha Hamming, MD   Encounter Date: 12/12/2019  PT End of Session - 12/12/19 1104    Visit Number  3    Number of Visits  24    Date for PT Re-Evaluation  02/21/20    Authorization Type  [employer]    PT Start Time  1100    PT Stop Time  1200    PT Time Calculation (min)  60 min       Past Medical History:  Diagnosis Date  . Anemia   . Brain lesion   . GERD (gastroesophageal reflux disease)   . Headache   . Hyperlipidemia 07/19/2018  . Numbness of lower extremity   . Parathyroid abnormality [facility])     Past Surgical History:  Procedure Laterality Date  . ANKLE CLOSED REDUCTION Right 07/05/2019   Procedure: Closed Reduction Ankle;  Surgeon: Hiram Gash, MD;  Location: [place];  Service: Orthopedics;  Laterality: Right;  . COLONOSCOPY    . DILATATION & CURETTAGE/HYSTEROSCOPY WITH MYOSURE N/A 10/24/2019   Procedure: DILATATION & CURETTAGE/HYSTEROSCOPY WITH MYOSURE;  Surgeon: Waymon Amato, MD;  Location: [place];  Service: Gynecology;  Laterality: N/A;  . DILATION AND EVACUATION N/A 02/12/2016   Procedure: DILATATION AND EVACUATION WITH SUCTION ;  Surgeon: Eldred Manges, MD;  Location: [facility];  Service: Gynecology;  Laterality: N/A;  . EXTERNAL FIXATION LEG Left 07/05/2019   Procedure: EXTERNAL FIXATION LEFT ANKLE;  Surgeon: Hiram Gash, MD;  Location: [place];  Service: Orthopedics;  Laterality: Left;  . EXTERNAL FIXATION REMOVAL Left 07/19/2019   Procedure: REMOVAL EXTERNAL FIXATION LEG;  Surgeon: Shona Needles, MD;  Location: [place];  Service: Orthopedics;  Laterality: Left;  . HYSTEROSCOPY WITH RESECTOSCOPE N/A 02/12/2016   Procedure: HYSTEROSCOPIC MYOMECTOMY WITH RESECTOSCOPE;  Surgeon:  Eldred Manges, MD;  Location: [facility];  Service: Gynecology;  Laterality: N/A;  . I & D EXTREMITY Left 07/05/2019   Procedure: IRRIGATION AND DEBRIDEMENT left ankle ;  Surgeon: Hiram Gash, MD;  Location: [place];  Service: Orthopedics;  Laterality: Left;  . I & D EXTREMITY Left 07/08/2019   Procedure: IRRIGATION AND DEBRIDEMENT EXTREMITY WITH ADJUSTMENT OF EXTERNAL FIXATOR;  Surgeon: Shona Needles, MD;  Location: [employer];  Service: Orthopedics;  Laterality: Left;  Marland Kitchen MASS EXCISION Right 08/30/2017   Procedure: EXCISION RIGHT UPPER ARM MASS;  Surgeon: Coralie Keens, MD;  Location: [place];  Service: General;  Laterality: Right;  . MYOMECTOMY N/A 02/12/2016   Procedure: ABDOMINAL MYOMECTOMY;  Surgeon: Eldred Manges, MD;  Location: [facility];  Service: Gynecology;  Laterality: N/A;  . OPEN REDUCTION INTERNAL FIXATION (ORIF) TIBIA/FIBULA FRACTURE Left 07/19/2019   Procedure: OPEN REDUCTION INTERNAL FIXATION (ORIF) LEFT PILON;  Surgeon: Shona Needles, MD;  Location: [place];  Service: Orthopedics;  Laterality: Left;  . ORIF ANKLE FRACTURE Right 07/08/2019   Procedure: OPEN REDUCTION INTERNAL FIXATION (ORIF) ANKLE FRACTURE;  Surgeon: Shona Needles, MD;  Location: [place];  Service: Orthopedics;  Laterality: Right;  . UPPER GI ENDOSCOPY    . WISDOM TOOTH EXTRACTION      There were no vitals filed for this visit.  Subjective Assessment - 12/12/19 1103    Subjective  No pain now. I  am having less difficulty with initial stand and walk after being at rest.    Currently in Pain?  No/denies         [facility] PT Assessment - 12/12/19 0001      AROM   Right Ankle Dorsiflexion  5    Left Ankle Dorsiflexion  2      PROM   Right Ankle Dorsiflexion  10    Left Ankle Dorsiflexion  5                   [facility] Adult PT Treatment/Exercise - 12/12/19 0001      Ambulation/Gait   Gait Comments  gait without AD< antalgic pattern, improved with cues       Modalities    Modalities  Vasopneumatic      Vasopneumatic   Number Minutes Vasopneumatic   15 minutes    Vasopnuematic Location   Ankle    Vasopneumatic Pressure  Medium    Vasopneumatic Temperature   34      Manual Therapy   Manual therapy comments  PROM x 3 each for DF       Ankle Exercises: Standing   Rocker Board  1 minute    Heel Raises  10 reps   with weight shifting lateral    Heel Raises Limitations  small lifts, bilat     Toe Raise  10 reps    Other Standing Ankle Exercises  3 way hip standing, marching , tandem stance    on foam pad   Other Standing Ankle Exercises  6 inch step up x 20 each, cues for technique and control , 6 inch lateral step up x 15 each       Ankle Exercises: Stretches   Gastroc Stretch Limitations  runners stretch bilateral  30 sec x 2 each                PT Short Term Goals - 11/29/19 2017      PT SHORT TERM GOAL #1   Title  Independent with HEP    Baseline  updated today from home health HEP    Time  6    Period  Weeks    Status  New      PT SHORT TERM GOAL #2   Title  Independent community ambulation without further need for crutch use    Baseline  single crutch use    Time  6    Period  Weeks      PT SHORT TERM GOAL #3   Title  Increase left ankle DF AROM to at least neutral for improved toe clearance with gait    Baseline  -4 deg    Time  6    Period  Weeks    Status  New        PT Long Term Goals - 11/29/19 2018      PT LONG TERM GOAL #1   Title  Improve FOTO outcome measure score to 26% or less impairment    Baseline  41% limited    Time  12    Period  Weeks    Status  New    Target Date  02/21/20      PT LONG TERM GOAL #2   Title  Bilateral ankle strength 5/5 to improve ability for stair navigation at home and for ankle stability with outdoor ambulation over uneven surfaces    Baseline  Right side: EV 4-/5, IV 4/5, DF 4+/5, PF 4/5 (  supine), left side: EV 4-/5, IV 4/5, DF 4/5, PF 4-/5 (supine)    Time  12    Period   Weeks    Status  New    Target Date  02/21/20      PT LONG TERM GOAL #3   Title  Tolerate community level ambulation with bilat. ankle pain <2/10 at worst    Baseline  left 5/10 today, right 2/10 but intermittently higher pain    Time  12    Period  Weeks    Status  New    Target Date  02/21/20      PT LONG TERM GOAL #4   Title  Ascend/descend 15 steps to 2nd floor of home with reciprocal gait    Time  12    Period  Weeks    Status  New    Target Date  02/21/20            Plan - 12/12/19 1225    Clinical Impression Statement  AROM and PROM DF improved. Progressed stabilization with good toleracen. Trial of VASO for left ankle edema and pain. She reported imorovement in tightness due to edema after vaso.    PT Next Visit Plan  Recumbent bike, partial squats, pending pain try step up, balance/proprioception with Airex, marches, rocker board, core strengthening, ankle PROM focus left side, gastroc stretches,    PT Home Exercise Plan  ankle Therband 4-way, gastroc stretch longsitting with strap vs. standing if tolerated, standing hip 3-way SLR with Theraband, partial squat, Romberg on cushion, for core strengthening work pelvic tilts, supine marches and standing shoulder extension with Theraband       Patient will benefit from skilled therapeutic intervention in order to improve the following deficits and impairments:  Pain, Impaired flexibility, Decreased strength, Decreased activity tolerance, Increased edema, Decreased range of motion, Difficulty walking, Hypomobility, Decreased mobility, Decreased balance, Impaired sensation  Visit Diagnosis: Pain in left ankle and joints of left foot  Pain in right ankle and joints of right foot  Localized edema  Stiffness of left ankle, not elsewhere classified  Stiffness of right ankle, not elsewhere classified  Difficulty in walking, not elsewhere classified  Muscle weakness (generalized)     Problem List Patient Active  Problem List   Diagnosis Date Noted  . Type III open pilon fracture of left tibia 07/19/2019  . Acute stress disorder   . Nondisplaced fracture of posterior process of right talus 07/09/2019  . Open pilon fracture, left, type III, initial encounter 07/06/2019  . Trimalleolar fracture of ankle, closed, right, initial encounter 07/06/2019  . Fall from height of greater than 3 feet 07/06/2019  . Closed compression fracture of body of L1 vertebra ([place]) 07/06/2019  . Open ankle fracture 07/05/2019  . Hyperlipidemia 07/19/2018  . Atypical chest pain 07/19/2018  . GERD (gastroesophageal reflux disease) 10/28/2015  . Nonspecific abnormal electrocardiogram (ECG) (EKG) 02/11/2015  . Fibroid uterus 01/17/2015  . Anemia, iron deficiency 12/20/2014    Dorene Ar, PTA 12/12/2019, 12:27 PM  [facility] [address] [place], [place], [address] Phone: [phone]   Fax:  [phone]  Name: Ana Freeman MRN: [id number] Date of Birth: 05-03-1971

## 2019-12-17 ENCOUNTER — Other Ambulatory Visit: Payer: Self-pay

## 2019-12-17 ENCOUNTER — Ambulatory Visit: Payer: BC Managed Care – PPO | Attending: Student | Admitting: Physical Therapy

## 2019-12-17 DIAGNOSIS — M25572 Pain in left ankle and joints of left foot: Secondary | ICD-10-CM

## 2019-12-17 DIAGNOSIS — M25671 Stiffness of right ankle, not elsewhere classified: Secondary | ICD-10-CM | POA: Diagnosis present

## 2019-12-17 DIAGNOSIS — R600 Localized edema: Secondary | ICD-10-CM | POA: Diagnosis present

## 2019-12-17 DIAGNOSIS — R262 Difficulty in walking, not elsewhere classified: Secondary | ICD-10-CM | POA: Diagnosis present

## 2019-12-17 DIAGNOSIS — M6281 Muscle weakness (generalized): Secondary | ICD-10-CM | POA: Diagnosis present

## 2019-12-17 DIAGNOSIS — M25571 Pain in right ankle and joints of right foot: Secondary | ICD-10-CM

## 2019-12-17 DIAGNOSIS — M25672 Stiffness of left ankle, not elsewhere classified: Secondary | ICD-10-CM | POA: Diagnosis present

## 2019-12-17 NOTE — Therapy (Signed)
[place], [place], [address] Phone: [phone]   Fax:  [phone]  Physical Therapy Treatment  Patient Details  Name: Ana Freeman MRN: [id number] Date of Birth: [birth year] Referring Provider (PT): Katha Hamming, MD   Encounter Date: 12/17/2019  PT End of Session - 12/17/19 1103    Visit Number  4    Number of Visits  24    Date for PT Re-Evaluation  02/21/20    Authorization Type  BCBS    PT Start Time  1100    PT Stop Time  1150    PT Time Calculation (min)  50 min       Past Medical History:  Diagnosis Date  . Anemia   . Brain lesion   . GERD (gastroesophageal reflux disease)   . Headache   . Hyperlipidemia 07/19/2018  . Numbness of lower extremity   . Parathyroid abnormality [facility])     Past Surgical History:  Procedure Laterality Date  . ANKLE CLOSED REDUCTION Right 07/05/2019   Procedure: Closed Reduction Ankle;  Surgeon: Hiram Gash, MD;  Location: [place];  Service: Orthopedics;  Laterality: Right;  . COLONOSCOPY    . DILATATION & CURETTAGE/HYSTEROSCOPY WITH MYOSURE N/A 10/24/2019   Procedure: DILATATION & CURETTAGE/HYSTEROSCOPY WITH MYOSURE;  Surgeon: Waymon Amato, MD;  Location: [place];  Service: Gynecology;  Laterality: N/A;  . DILATION AND EVACUATION N/A 02/12/2016   Procedure: DILATATION AND EVACUATION WITH SUCTION ;  Surgeon: Eldred Manges, MD;  Location: [facility];  Service: Gynecology;  Laterality: N/A;  . EXTERNAL FIXATION LEG Left 07/05/2019   Procedure: EXTERNAL FIXATION LEFT ANKLE;  Surgeon: Hiram Gash, MD;  Location: [place];  Service: Orthopedics;  Laterality: Left;  . EXTERNAL FIXATION REMOVAL Left 07/19/2019   Procedure: REMOVAL EXTERNAL FIXATION LEG;  Surgeon: Shona Needles, MD;  Location: [place];  Service: Orthopedics;  Laterality: Left;  . HYSTEROSCOPY WITH RESECTOSCOPE N/A 02/12/2016   Procedure: HYSTEROSCOPIC MYOMECTOMY WITH RESECTOSCOPE;  Surgeon:  Eldred Manges, MD;  Location: [place] [facility];  Service: Gynecology;  Laterality: N/A;  . I & D EXTREMITY Left 07/05/2019   Procedure: IRRIGATION AND DEBRIDEMENT left ankle ;  Surgeon: Hiram Gash, MD;  Location: [place];  Service: Orthopedics;  Laterality: Left;  . I & D EXTREMITY Left 07/08/2019   Procedure: IRRIGATION AND DEBRIDEMENT EXTREMITY WITH ADJUSTMENT OF EXTERNAL FIXATOR;  Surgeon: Shona Needles, MD;  Location: [place];  Service: Orthopedics;  Laterality: Left;  Marland Kitchen MASS EXCISION Right 08/30/2017   Procedure: EXCISION RIGHT UPPER ARM MASS;  Surgeon: Coralie Keens, MD;  Location: [place];  Service: General;  Laterality: Right;  . MYOMECTOMY N/A 02/12/2016   Procedure: ABDOMINAL MYOMECTOMY;  Surgeon: Eldred Manges, MD;  Location: [place] [facility];  Service: Gynecology;  Laterality: N/A;  . OPEN REDUCTION INTERNAL FIXATION (ORIF) TIBIA/FIBULA FRACTURE Left 07/19/2019   Procedure: OPEN REDUCTION INTERNAL FIXATION (ORIF) LEFT PILON;  Surgeon: Shona Needles, MD;  Location: [place];  Service: Orthopedics;  Laterality: Left;  . ORIF ANKLE FRACTURE Right 07/08/2019   Procedure: OPEN REDUCTION INTERNAL FIXATION (ORIF) ANKLE FRACTURE;  Surgeon: Shona Needles, MD;  Location: [place];  Service: Orthopedics;  Laterality: Right;  . UPPER GI ENDOSCOPY    . WISDOM TOOTH EXTRACTION      There were no vitals filed for this visit.  Subjective Assessment - 12/17/19 1103    Subjective  Left foot is good  but the right has occassional sharp pains with walking, No pain now. I am working on heel/toe pattern.    Currently in Pain?  No/denies                       [facility] Adult PT Treatment/Exercise - 12/17/19 0001      Lumbar Exercises: Supine   Pelvic Tilt  20 reps    Bridge  20 reps    Other Supine Lumbar Exercises  knee to chest stretch , LTR       Vasopneumatic   Number Minutes Vasopneumatic   10 minutes    Vasopnuematic Location   Ankle    Vasopneumatic Pressure  Medium     Vasopneumatic Temperature   34      Manual Therapy   Manual therapy comments  PROM x 3 each for DF       Ankle Exercises: Standing   SLS  5 sec trials     Rocker Board  1 minute    Heel Raises  10 reps   with weight shifting lateral    Toe Raise  10 reps    Other Standing Ankle Exercises  3 way hip standing, marching , tandem stance    on foam pad   Other Standing Ankle Exercises  6 inch step up x 20 each, cues for technique and control , 6 inch lateral step up x 15 each       Ankle Exercises: Stretches   Gastroc Stretch Limitations  runners stretch bilateral  30 sec x 2 each     Slant Board Stretch  1 rep               PT Short Term Goals - 11/29/19 2017      PT SHORT TERM GOAL #1   Title  Independent with HEP    Baseline  updated today from home health HEP    Time  6    Period  Weeks    Status  New      PT SHORT TERM GOAL #2   Title  Independent community ambulation without further need for crutch use    Baseline  single crutch use    Time  6    Period  Weeks      PT SHORT TERM GOAL #3   Title  Increase left ankle DF AROM to at least neutral for improved toe clearance with gait    Baseline  -4 deg    Time  6    Period  Weeks    Status  New        PT Long Term Goals - 11/29/19 2018      PT LONG TERM GOAL #1   Title  Improve FOTO outcome measure score to 26% or less impairment    Baseline  41% limited    Time  12    Period  Weeks    Status  New    Target Date  02/21/20      PT LONG TERM GOAL #2   Title  Bilateral ankle strength 5/5 to improve ability for stair navigation at home and for ankle stability with outdoor ambulation over uneven surfaces    Baseline  Right side: EV 4-/5, IV 4/5, DF 4+/5, PF 4/5 (supine), left side: EV 4-/5, IV 4/5, DF 4/5, PF 4-/5 (supine)    Time  12    Period  Weeks    Status  New    Target  Date  02/21/20      PT LONG TERM GOAL #3   Title  Tolerate community level ambulation with bilat. ankle pain <2/10 at worst     Baseline  left 5/10 today, right 2/10 but intermittently higher pain    Time  12    Period  Weeks    Status  New    Target Date  02/21/20      PT LONG TERM GOAL #4   Title  Ascend/descend 15 steps to 2nd floor of home with reciprocal gait    Time  12    Period  Weeks    Status  New    Target Date  02/21/20            Plan - 12/17/19 1126    Clinical Impression Statement  Pt arrives with improved gait pattern. Continued with increased closed chain and proprioception exercises. Increased pain with end range PROM for Left DF. Repeated Vaso as this was beneficial for left ankle.    PT Next Visit Plan  Recumbent bike, partial squats, pending pain try step up, balance/proprioception with Airex, marches, rocker board, core strengthening, ankle PROM focus left side, gastroc stretches,    PT Home Exercise Plan  ankle Therband 4-way, gastroc stretch longsitting with strap vs. standing if tolerated, standing hip 3-way SLR with Theraband, partial squat, Romberg on cushion, for core strengthening work pelvic tilts, supine marches and standing shoulder extension with Theraband       Patient will benefit from skilled therapeutic intervention in order to improve the following deficits and impairments:  Pain, Impaired flexibility, Decreased strength, Decreased activity tolerance, Increased edema, Decreased range of motion, Difficulty walking, Hypomobility, Decreased mobility, Decreased balance, Impaired sensation  Visit Diagnosis: Pain in left ankle and joints of left foot  Pain in right ankle and joints of right foot  Localized edema  Stiffness of left ankle, not elsewhere classified  Stiffness of right ankle, not elsewhere classified  Difficulty in walking, not elsewhere classified  Muscle weakness (generalized)     Problem List Patient Active Problem List   Diagnosis Date Noted  . Type III open pilon fracture of left tibia 07/19/2019  . Acute stress disorder   . Nondisplaced  fracture of posterior process of right talus 07/09/2019  . Open pilon fracture, left, type III, initial encounter 07/06/2019  . Trimalleolar fracture of ankle, closed, right, initial encounter 07/06/2019  . Fall from height of greater than 3 feet 07/06/2019  . Closed compression fracture of body of L1 vertebra ([place]) 07/06/2019  . Open ankle fracture 07/05/2019  . Hyperlipidemia 07/19/2018  . Atypical chest pain 07/19/2018  . GERD (gastroesophageal reflux disease) 10/28/2015  . Nonspecific abnormal electrocardiogram (ECG) (EKG) 02/11/2015  . Fibroid uterus 01/17/2015  . Anemia, iron deficiency 12/20/2014    Dorene Ar, PTA 12/17/2019, 11:44 AM  [facility] [address] [place], [place], [address] Phone: [phone]   Fax:  [phone]  Name: DEMYAH BARBOUR MRN: [id number] Date of Birth: 1971-09-10

## 2019-12-19 ENCOUNTER — Other Ambulatory Visit: Payer: Self-pay

## 2019-12-19 ENCOUNTER — Encounter: Payer: Self-pay | Admitting: Physical Therapy

## 2019-12-19 ENCOUNTER — Ambulatory Visit: Payer: BC Managed Care – PPO | Admitting: Physical Therapy

## 2019-12-19 DIAGNOSIS — M25571 Pain in right ankle and joints of right foot: Secondary | ICD-10-CM

## 2019-12-19 DIAGNOSIS — M25572 Pain in left ankle and joints of left foot: Secondary | ICD-10-CM | POA: Diagnosis not present

## 2019-12-19 NOTE — Therapy (Signed)
[place], [place], [address] Phone: [phone]   Fax:  [phone]  Physical Therapy Treatment  Patient Details  Name: Ana Freeman MRN: [id number] Date of Birth: [birth year] Referring Provider (PT): Katha Hamming, MD   Encounter Date: 12/19/2019  PT End of Session - 12/19/19 1320    Visit Number  5    Number of Visits  24    Date for PT Re-Evaluation  02/21/20    Authorization Type  BCBS    PT Start Time  [id number]    PT Stop Time  1415    PT Time Calculation (min)  60 min       Past Medical History:  Diagnosis Date  . Anemia   . Brain lesion   . GERD (gastroesophageal reflux disease)   . Headache   . Hyperlipidemia 07/19/2018  . Numbness of lower extremity   . Parathyroid abnormality [facility])     Past Surgical History:  Procedure Laterality Date  . ANKLE CLOSED REDUCTION Right 07/05/2019   Procedure: Closed Reduction Ankle;  Surgeon: Hiram Gash, MD;  Location: [place];  Service: Orthopedics;  Laterality: Right;  . COLONOSCOPY    . DILATATION & CURETTAGE/HYSTEROSCOPY WITH MYOSURE N/A 10/24/2019   Procedure: DILATATION & CURETTAGE/HYSTEROSCOPY WITH MYOSURE;  Surgeon: Waymon Amato, MD;  Location: [place];  Service: Gynecology;  Laterality: N/A;  . DILATION AND EVACUATION N/A 02/12/2016   Procedure: DILATATION AND EVACUATION WITH SUCTION ;  Surgeon: Eldred Manges, MD;  Location: [facility];  Service: Gynecology;  Laterality: N/A;  . EXTERNAL FIXATION LEG Left 07/05/2019   Procedure: EXTERNAL FIXATION LEFT ANKLE;  Surgeon: Hiram Gash, MD;  Location: [place];  Service: Orthopedics;  Laterality: Left;  . EXTERNAL FIXATION REMOVAL Left 07/19/2019   Procedure: REMOVAL EXTERNAL FIXATION LEG;  Surgeon: Shona Needles, MD;  Location: [place];  Service: Orthopedics;  Laterality: Left;  . HYSTEROSCOPY WITH RESECTOSCOPE N/A 02/12/2016   Procedure: HYSTEROSCOPIC MYOMECTOMY WITH RESECTOSCOPE;  Surgeon:  Eldred Manges, MD;  Location: [facility];  Service: Gynecology;  Laterality: N/A;  . I & D EXTREMITY Left 07/05/2019   Procedure: IRRIGATION AND DEBRIDEMENT left ankle ;  Surgeon: Hiram Gash, MD;  Location: [place];  Service: Orthopedics;  Laterality: Left;  . I & D EXTREMITY Left 07/08/2019   Procedure: IRRIGATION AND DEBRIDEMENT EXTREMITY WITH ADJUSTMENT OF EXTERNAL FIXATOR;  Surgeon: Shona Needles, MD;  Location: [place];  Service: Orthopedics;  Laterality: Left;  Marland Kitchen MASS EXCISION Right 08/30/2017   Procedure: EXCISION RIGHT UPPER ARM MASS;  Surgeon: Coralie Keens, MD;  Location: [place];  Service: General;  Laterality: Right;  . MYOMECTOMY N/A 02/12/2016   Procedure: ABDOMINAL MYOMECTOMY;  Surgeon: Eldred Manges, MD;  Location: [facility];  Service: Gynecology;  Laterality: N/A;  . OPEN REDUCTION INTERNAL FIXATION (ORIF) TIBIA/FIBULA FRACTURE Left 07/19/2019   Procedure: OPEN REDUCTION INTERNAL FIXATION (ORIF) LEFT PILON;  Surgeon: Shona Needles, MD;  Location: [place];  Service: Orthopedics;  Laterality: Left;  . ORIF ANKLE FRACTURE Right 07/08/2019   Procedure: OPEN REDUCTION INTERNAL FIXATION (ORIF) ANKLE FRACTURE;  Surgeon: Shona Needles, MD;  Location: [place];  Service: Orthopedics;  Laterality: Right;  . UPPER GI ENDOSCOPY    . WISDOM TOOTH EXTRACTION      There were no vitals filed for this visit.  Subjective Assessment - 12/19/19 1329    Subjective  No pain. Feel swollen  on ball of foot , thinckness when I do heel raises    Currently in Pain?  No/denies                       [facility] Adult PT Treatment/Exercise - 12/19/19 0001      Vasopneumatic   Number Minutes Vasopneumatic   15 minutes    Vasopnuematic Location   Ankle    Vasopneumatic Pressure  Medium    Vasopneumatic Temperature   34      Ankle Exercises: Standing   SLS  20 sec right  , 5 sec on left     Rocker Board  1 minute    Heel Raises  20 reps    Heel Raises Limitations  lowering  with more weight to left     Toe Raise  10 reps    Other Standing Ankle Exercises  3 way hip standing, marching , tandem stance    on foam pad   Other Standing Ankle Exercises  6 inch step up x 20 each, cues for technique and control , 6 inch lateral step up x 15 each       Ankle Exercises: Aerobic   Tread Mill  1.4 mph forward, then .4 mph retro       Ankle Exercises: Stretches   Slant Board Stretch  1 rep      Ankle Exercises: Seated   Other Seated Ankle Exercises  towel scrunch    Other Seated Ankle Exercises  toe yoga, self toe flexion and extension stretching               PT Short Term Goals - 11/29/19 2017      PT SHORT TERM GOAL #1   Title  Independent with HEP    Baseline  updated today from home health HEP    Time  6    Period  Weeks    Status  New      PT SHORT TERM GOAL #2   Title  Independent community ambulation without further need for crutch use    Baseline  single crutch use    Time  6    Period  Weeks      PT SHORT TERM GOAL #3   Title  Increase left ankle DF AROM to at least neutral for improved toe clearance with gait    Baseline  -4 deg    Time  6    Period  Weeks    Status  New        PT Long Term Goals - 11/29/19 2018      PT LONG TERM GOAL #1   Title  Improve FOTO outcome measure score to 26% or less impairment    Baseline  41% limited    Time  12    Period  Weeks    Status  New    Target Date  02/21/20      PT LONG TERM GOAL #2   Title  Bilateral ankle strength 5/5 to improve ability for stair navigation at home and for ankle stability with outdoor ambulation over uneven surfaces    Baseline  Right side: EV 4-/5, IV 4/5, DF 4+/5, PF 4/5 (supine), left side: EV 4-/5, IV 4/5, DF 4/5, PF 4-/5 (supine)    Time  12    Period  Weeks    Status  New    Target Date  02/21/20      PT LONG TERM GOAL #3  Title  Tolerate community level ambulation with bilat. ankle pain <2/10 at worst    Baseline  left 5/10 today, right 2/10 but  intermittently higher pain    Time  12    Period  Weeks    Status  New    Target Date  02/21/20      PT LONG TERM GOAL #4   Title  Ascend/descend 15 steps to 2nd floor of home with reciprocal gait    Time  12    Period  Weeks    Status  New    Target Date  02/21/20            Plan - 12/19/19 1403    Clinical Impression Statement  Pt able to rise into DF with cues to engage toes. She does note her toes feel weak, especially on left. Instructed her in seated toe yoga and towel squeezes with decreased active movement noted on the left. Updated HEP. Continued with closed chain strength and proprioception and she tolerated the session well without complaints.    PT Next Visit Plan  Recumbent bike, partial squats, pending pain try step up, balance/proprioception with Airex, marches, rocker board, core strengthening, ankle PROM focus left side, gastroc stretches,    PT Home Exercise Plan  ankle Therband 4-way, gastroc stretch longsitting with strap vs. standing if tolerated, standing hip 3-way SLR with Theraband, partial squat, Romberg on cushion, for core strengthening work pelvic tilts, supine marches and standing shoulder extension with Theraband. Toe yoga, towel scrunches.       Patient will benefit from skilled therapeutic intervention in order to improve the following deficits and impairments:  Pain, Impaired flexibility, Decreased strength, Decreased activity tolerance, Increased edema, Decreased range of motion, Difficulty walking, Hypomobility, Decreased mobility, Decreased balance, Impaired sensation  Visit Diagnosis: Pain in right ankle and joints of right foot  Pain in left ankle and joints of left foot     Problem List Patient Active Problem List   Diagnosis Date Noted  . Type III open pilon fracture of left tibia 07/19/2019  . Acute stress disorder   . Nondisplaced fracture of posterior process of right talus 07/09/2019  . Open pilon fracture, left, type III, initial  encounter 07/06/2019  . Trimalleolar fracture of ankle, closed, right, initial encounter 07/06/2019  . Fall from height of greater than 3 feet 07/06/2019  . Closed compression fracture of body of L1 vertebra ([place]) 07/06/2019  . Open ankle fracture 07/05/2019  . Hyperlipidemia 07/19/2018  . Atypical chest pain 07/19/2018  . GERD (gastroesophageal reflux disease) 10/28/2015  . Nonspecific abnormal electrocardiogram (ECG) (EKG) 02/11/2015  . Fibroid uterus 01/17/2015  . Anemia, iron deficiency 12/20/2014    Dorene Ar, PTA 12/19/2019, 2:07 PM  [facility] [address] [place], [place], [address] Phone: [phone]   Fax:  [phone]  Name: Ana Freeman MRN: [id number] Date of Birth: July 20, 1971

## 2019-12-24 ENCOUNTER — Encounter: Payer: Self-pay | Admitting: Physical Therapy

## 2019-12-24 ENCOUNTER — Other Ambulatory Visit: Payer: Self-pay

## 2019-12-24 ENCOUNTER — Ambulatory Visit: Payer: BC Managed Care – PPO | Admitting: Physical Therapy

## 2019-12-24 DIAGNOSIS — R600 Localized edema: Secondary | ICD-10-CM

## 2019-12-24 DIAGNOSIS — M25671 Stiffness of right ankle, not elsewhere classified: Secondary | ICD-10-CM

## 2019-12-24 DIAGNOSIS — M25672 Stiffness of left ankle, not elsewhere classified: Secondary | ICD-10-CM

## 2019-12-24 DIAGNOSIS — M25572 Pain in left ankle and joints of left foot: Secondary | ICD-10-CM

## 2019-12-24 DIAGNOSIS — M6281 Muscle weakness (generalized): Secondary | ICD-10-CM

## 2019-12-24 DIAGNOSIS — M25571 Pain in right ankle and joints of right foot: Secondary | ICD-10-CM

## 2019-12-24 DIAGNOSIS — R262 Difficulty in walking, not elsewhere classified: Secondary | ICD-10-CM

## 2019-12-24 NOTE — Therapy (Signed)
[place], [place], [address] Phone: [phone]   Fax:  [phone]  Physical Therapy Treatment  Patient Details  Name: Ana Freeman MRN: [id number] Date of Birth: [birth year] Referring Provider (PT): Katha Hamming, MD   Encounter Date: 12/24/2019  PT End of Session - 12/24/19 1159    Visit Number  6    Number of Visits  24    Date for PT Re-Evaluation  02/21/20    Authorization Type  BCBS    PT Start Time  1152   pt. arrived late   PT Stop Time  1231    PT Time Calculation (min)  39 min    Activity Tolerance  Patient tolerated treatment well    Behavior During Therapy  WFL for tasks assessed/performed       Past Medical History:  Diagnosis Date  . Anemia   . Brain lesion   . GERD (gastroesophageal reflux disease)   . Headache   . Hyperlipidemia 07/19/2018  . Numbness of lower extremity   . Parathyroid abnormality [facility])     Past Surgical History:  Procedure Laterality Date  . ANKLE CLOSED REDUCTION Right 07/05/2019   Procedure: Closed Reduction Ankle;  Surgeon: Hiram Gash, MD;  Location: [place];  Service: Orthopedics;  Laterality: Right;  . COLONOSCOPY    . DILATATION & CURETTAGE/HYSTEROSCOPY WITH MYOSURE N/A 10/24/2019   Procedure: DILATATION & CURETTAGE/HYSTEROSCOPY WITH MYOSURE;  Surgeon: Waymon Amato, MD;  Location: [place];  Service: Gynecology;  Laterality: N/A;  . DILATION AND EVACUATION N/A 02/12/2016   Procedure: DILATATION AND EVACUATION WITH SUCTION ;  Surgeon: Eldred Manges, MD;  Location: [place] [facility];  Service: Gynecology;  Laterality: N/A;  . EXTERNAL FIXATION LEG Left 07/05/2019   Procedure: EXTERNAL FIXATION LEFT ANKLE;  Surgeon: Hiram Gash, MD;  Location: [place];  Service: Orthopedics;  Laterality: Left;  . EXTERNAL FIXATION REMOVAL Left 07/19/2019   Procedure: REMOVAL EXTERNAL FIXATION LEG;  Surgeon: Shona Needles, MD;  Location: [place];  Service: Orthopedics;   Laterality: Left;  . HYSTEROSCOPY WITH RESECTOSCOPE N/A 02/12/2016   Procedure: HYSTEROSCOPIC MYOMECTOMY WITH RESECTOSCOPE;  Surgeon: Eldred Manges, MD;  Location: [place] [facility];  Service: Gynecology;  Laterality: N/A;  . I & D EXTREMITY Left 07/05/2019   Procedure: IRRIGATION AND DEBRIDEMENT left ankle ;  Surgeon: Hiram Gash, MD;  Location: [place];  Service: Orthopedics;  Laterality: Left;  . I & D EXTREMITY Left 07/08/2019   Procedure: IRRIGATION AND DEBRIDEMENT EXTREMITY WITH ADJUSTMENT OF EXTERNAL FIXATOR;  Surgeon: Shona Needles, MD;  Location: [place];  Service: Orthopedics;  Laterality: Left;  Marland Kitchen MASS EXCISION Right 08/30/2017   Procedure: EXCISION RIGHT UPPER ARM MASS;  Surgeon: Coralie Keens, MD;  Location: [place];  Service: General;  Laterality: Right;  . MYOMECTOMY N/A 02/12/2016   Procedure: ABDOMINAL MYOMECTOMY;  Surgeon: Eldred Manges, MD;  Location: [facility];  Service: Gynecology;  Laterality: N/A;  . OPEN REDUCTION INTERNAL FIXATION (ORIF) TIBIA/FIBULA FRACTURE Left 07/19/2019   Procedure: OPEN REDUCTION INTERNAL FIXATION (ORIF) LEFT PILON;  Surgeon: Shona Needles, MD;  Location: [place];  Service: Orthopedics;  Laterality: Left;  . ORIF ANKLE FRACTURE Right 07/08/2019   Procedure: OPEN REDUCTION INTERNAL FIXATION (ORIF) ANKLE FRACTURE;  Surgeon: Shona Needles, MD;  Location: [place];  Service: Orthopedics;  Laterality: Right;  . UPPER GI ENDOSCOPY    . WISDOM TOOTH EXTRACTION  There were no vitals filed for this visit.  Subjective Assessment - 12/24/19 1155    Subjective  No pain pre.tx. Still having some numbness issues in toes.    Currently in Pain?  No/denies         [facility] PT Assessment - 12/24/19 0001      Strength   Right Ankle Dorsiflexion  4+/5    Right Ankle Inversion  4/5    Right Ankle Eversion  4+/5    Left Ankle Dorsiflexion  4/5    Left Ankle Inversion  4/5    Left Ankle Eversion  4+/5                   [facility] Adult PT  Treatment/Exercise - 12/24/19 0001      Vasopneumatic   Number Minutes Vasopneumatic   --   pt. declined vaso/cryo today     Ankle Exercises: Aerobic   Tread Mill  1.4-1.8 mph x 4 min, 0.4-0.6 mpf x 3 min reverse      Ankle Exercises: Stretches   Slant Board Stretch  3 reps;30 seconds      Ankle Exercises: Standing   SLS  --   "slow march" alt. SLS x 20 ea. bilat.   Rocker Board  2 minutes   1 min-dynamic balance fw/rev wooden board,lateral blue board   Rebounder  --   30 throws with feet together on Airex with 1000 g ball   Heel Raises  20 reps   on Airex   Toe Raise  10 reps   on Airex   Other Standing Ankle Exercises  Pall off press with doubled green band feet together on Airex x 10 ea. direction, shoulder ext with Theraband on Airex green band x20, TRX squat 2x10    Other Standing Ankle Exercises  BOSU blue side step ups x 10 ea. bilat., lateral step up 6 in. step 2x10 ea. bilat., mini lunge x 10 ea. bilat.             PT Education - 12/24/19 1247    Education Details  exercises, progress    Person(s) Educated  Patient    Methods  Explanation;Demonstration;Verbal cues    Comprehension  Verbalized understanding       PT Short Term Goals - 11/29/19 2017      PT SHORT TERM GOAL #1   Title  Independent with HEP    Baseline  updated today from home health HEP    Time  6    Period  Weeks    Status  New      PT SHORT TERM GOAL #2   Title  Independent community ambulation without further need for crutch use    Baseline  single crutch use    Time  6    Period  Weeks      PT SHORT TERM GOAL #3   Title  Increase left ankle DF AROM to at least neutral for improved toe clearance with gait    Baseline  -4 deg    Time  6    Period  Weeks    Status  New        PT Long Term Goals - 12/24/19 1250      PT LONG TERM GOAL #1   Title  Improve FOTO outcome measure score to 26% or less impairment    Baseline  41% limited at eval    Time  12    Period  Weeks  Status  On-going      PT LONG TERM GOAL #2   Title  Bilateral ankle strength 5/5 to improve ability for stair navigation at home and for ankle stability with outdoor ambulation over uneven surfaces    Baseline  see objective, goal ongoing    Time  12    Period  Weeks    Status  On-going      PT LONG TERM GOAL #3   Title  Tolerate community level ambulation with bilat. ankle pain <2/10 at worst    Baseline  pain improving but goal ongoing    Time  12    Period  Weeks    Status  On-going      PT LONG TERM GOAL #4   Title  Ascend/descend 15 steps to 2nd floor of home with reciprocal gait    Baseline  unable to descend with reciprocal gait    Time  12    Period  Weeks    Status  On-going            Plan - 12/24/19 1247    Clinical Impression Statement  Continued progression of closed chain strengthening and balance/proprioceptive challenges. Balance impacted by limited toe mobility with LE nerve healing ongoing but progressing with strength gains though still limited wity ability for functional activities including descending>ascending stairs with inbaility to perform with reciprocal gait.    Personal Factors and Comorbidities  Other;Time since onset of injury/illness/exacerbation    Examination-Activity Limitations  Squat;Stairs;Locomotion Level;Stand;Carry    Examination-Participation Restrictions  Laundry;Shop;Cleaning;Community Activity;Driving    Stability/Clinical Decision Making  Evolving/Moderate complexity    Clinical Decision Making  Moderate    Rehab Potential  Good    PT Frequency  --   1-2x/week   PT Duration  12 weeks    PT Treatment/Interventions  ADLs/Self Care Home Management;Electrical Stimulation;Cryotherapy;Stair training;Gait training;Functional mobility training;Neuromuscular re-education;Therapeutic activities;Therapeutic exercise;Patient/family education;Balance training;Manual techniques;Passive range of motion;Taping;Vasopneumatic Device;Moist Heat     PT Next Visit Plan  continue/progress closed chain activities and balance/proprioceptive challenges as tolerated    PT Home Exercise Plan  ankle Therband 4-way, gastroc stretch longsitting with strap vs. standing if tolerated, standing hip 3-way SLR with Theraband, partial squat, Romberg on cushion, for core strengthening work pelvic tilts, supine marches and standing shoulder extension with Theraband. Toe yoga, towel scrunches.    Consulted and Agree with Plan of Care  Patient       Patient will benefit from skilled therapeutic intervention in order to improve the following deficits and impairments:  Pain, Impaired flexibility, Decreased strength, Decreased activity tolerance, Increased edema, Decreased range of motion, Difficulty walking, Hypomobility, Decreased mobility, Decreased balance, Impaired sensation  Visit Diagnosis: Pain in right ankle and joints of right foot  Pain in left ankle and joints of left foot  Localized edema  Stiffness of left ankle, not elsewhere classified  Stiffness of right ankle, not elsewhere classified  Difficulty in walking, not elsewhere classified  Muscle weakness (generalized)     Problem List Patient Active Problem List   Diagnosis Date Noted  . Type III open pilon fracture of left tibia 07/19/2019  . Acute stress disorder   . Nondisplaced fracture of posterior process of right talus 07/09/2019  . Open pilon fracture, left, type III, initial encounter 07/06/2019  . Trimalleolar fracture of ankle, closed, right, initial encounter 07/06/2019  . Fall from height of greater than 3 feet 07/06/2019  . Closed compression fracture of body of L1 vertebra ([place]) 07/06/2019  .  Open ankle fracture 07/05/2019  . Hyperlipidemia 07/19/2018  . Atypical chest pain 07/19/2018  . GERD (gastroesophageal reflux disease) 10/28/2015  . Nonspecific abnormal electrocardiogram (ECG) (EKG) 02/11/2015  . Fibroid uterus 01/17/2015  . Anemia, iron deficiency 12/20/2014     Beaulah Dinning, PT, DPT 12/24/19 12:52 PM  [facility] [address] [place], [place], [address] Phone: [phone]   Fax:  [phone]  Name: Ana Freeman MRN: [id number] Date of Birth: [birth year]

## 2019-12-26 ENCOUNTER — Other Ambulatory Visit: Payer: Self-pay

## 2019-12-26 ENCOUNTER — Ambulatory Visit: Payer: BC Managed Care – PPO | Admitting: Physical Therapy

## 2019-12-26 ENCOUNTER — Encounter: Payer: Self-pay | Admitting: Physical Therapy

## 2019-12-26 DIAGNOSIS — M25672 Stiffness of left ankle, not elsewhere classified: Secondary | ICD-10-CM

## 2019-12-26 DIAGNOSIS — R600 Localized edema: Secondary | ICD-10-CM

## 2019-12-26 DIAGNOSIS — M25572 Pain in left ankle and joints of left foot: Secondary | ICD-10-CM | POA: Diagnosis not present

## 2019-12-26 DIAGNOSIS — M25571 Pain in right ankle and joints of right foot: Secondary | ICD-10-CM

## 2019-12-26 DIAGNOSIS — M25671 Stiffness of right ankle, not elsewhere classified: Secondary | ICD-10-CM

## 2019-12-26 DIAGNOSIS — M6281 Muscle weakness (generalized): Secondary | ICD-10-CM

## 2019-12-26 DIAGNOSIS — R262 Difficulty in walking, not elsewhere classified: Secondary | ICD-10-CM

## 2019-12-26 NOTE — Therapy (Signed)
[place], [place], [address] Phone: [phone]   Fax:  [phone]  Physical Therapy Treatment  Patient Details  Name: Ana Freeman MRN: [id number] Date of Birth: [birth year] Referring Provider (PT): Katha Hamming, MD   Encounter Date: 12/26/2019  PT End of Session - 12/26/19 1234    Visit Number  7    Number of Visits  24    Date for PT Re-Evaluation  02/21/20    Authorization Type  BCBS    PT Start Time  [id number]    PT Stop Time  1232    PT Time Calculation (min)  47 min    Activity Tolerance  Patient tolerated treatment well    Behavior During Therapy  [facility] for tasks assessed/performed       Past Medical History:  Diagnosis Date  . Anemia   . Brain lesion   . GERD (gastroesophageal reflux disease)   . Headache   . Hyperlipidemia 07/19/2018  . Numbness of lower extremity   . Parathyroid abnormality [facility])     Past Surgical History:  Procedure Laterality Date  . ANKLE CLOSED REDUCTION Right 07/05/2019   Procedure: Closed Reduction Ankle;  Surgeon: Hiram Gash, MD;  Location: [place];  Service: Orthopedics;  Laterality: Right;  . COLONOSCOPY    . DILATATION & CURETTAGE/HYSTEROSCOPY WITH MYOSURE N/A 10/24/2019   Procedure: DILATATION & CURETTAGE/HYSTEROSCOPY WITH MYOSURE;  Surgeon: Waymon Amato, MD;  Location: [place];  Service: Gynecology;  Laterality: N/A;  . DILATION AND EVACUATION N/A 02/12/2016   Procedure: DILATATION AND EVACUATION WITH SUCTION ;  Surgeon: Eldred Manges, MD;  Location: [facility];  Service: Gynecology;  Laterality: N/A;  . EXTERNAL FIXATION LEG Left 07/05/2019   Procedure: EXTERNAL FIXATION LEFT ANKLE;  Surgeon: Hiram Gash, MD;  Location: [place];  Service: Orthopedics;  Laterality: Left;  . EXTERNAL FIXATION REMOVAL Left 07/19/2019   Procedure: REMOVAL EXTERNAL FIXATION LEG;  Surgeon: Shona Needles, MD;  Location: [place];  Service: Orthopedics;  Laterality: Left;   . HYSTEROSCOPY WITH RESECTOSCOPE N/A 02/12/2016   Procedure: HYSTEROSCOPIC MYOMECTOMY WITH RESECTOSCOPE;  Surgeon: Eldred Manges, MD;  Location: [facility];  Service: Gynecology;  Laterality: N/A;  . I & D EXTREMITY Left 07/05/2019   Procedure: IRRIGATION AND DEBRIDEMENT left ankle ;  Surgeon: Hiram Gash, MD;  Location: [place];  Service: Orthopedics;  Laterality: Left;  . I & D EXTREMITY Left 07/08/2019   Procedure: IRRIGATION AND DEBRIDEMENT EXTREMITY WITH ADJUSTMENT OF EXTERNAL FIXATOR;  Surgeon: Shona Needles, MD;  Location: [place];  Service: Orthopedics;  Laterality: Left;  [place] Kitchen MASS EXCISION Right 08/30/2017   Procedure: EXCISION RIGHT UPPER ARM MASS;  Surgeon: Coralie Keens, MD;  Location: [place];  Service: General;  Laterality: Right;  . MYOMECTOMY N/A 02/12/2016   Procedure: ABDOMINAL MYOMECTOMY;  Surgeon: Eldred Manges, MD;  Location: [facility];  Service: Gynecology;  Laterality: N/A;  . OPEN REDUCTION INTERNAL FIXATION (ORIF) TIBIA/FIBULA FRACTURE Left 07/19/2019   Procedure: OPEN REDUCTION INTERNAL FIXATION (ORIF) LEFT PILON;  Surgeon: Shona Needles, MD;  Location: [place];  Service: Orthopedics;  Laterality: Left;  . ORIF ANKLE FRACTURE Right 07/08/2019   Procedure: OPEN REDUCTION INTERNAL FIXATION (ORIF) ANKLE FRACTURE;  Surgeon: Shona Needles, MD;  Location: [place];  Service: Orthopedics;  Laterality: Right;  . UPPER GI ENDOSCOPY    . WISDOM TOOTH EXTRACTION      There were  no vitals filed for this visit.  Subjective Assessment - 12/26/19 1153    Subjective  No major soreness after last session. Pt. noting some improvement with toe ROM with "toe yoga" exercise for HEP.                       [facility] Adult PT Treatment/Exercise - 12/26/19 0001      Exercises   Exercises  Knee/Hip      Knee/Hip Exercises: Standing   Lateral Step Up  Right;Left;2 sets;10 reps;Hand Hold: 0;Step Height: 6"    Forward Step Up  Right;Left;15 reps    Forward Step Up  Limitations  on Blue side BOSU at counter      Manual Therapy   Manual Therapy  Soft tissue mobilization    Soft tissue mobilization  STM/IASTM with roller bilat. gastroc/soleus, peroneals and tibialis anterior   soft pink roll     Ankle Exercises: Aerobic   Tread Mill  1.6-1.8 mph x 5 min fw, 3 min 0.5-0.8 mph reverse      Ankle Exercises: Stretches   Set designer  3 reps;30 seconds      Ankle Exercises: Standing   Rocker Board  2 minutes   1 min-dynamic balance fw/rev wooden board,lateral blue board   Rebounder  --   30 throws with feet together on Airex with 1000 g ball   Heel Raises  Left;20 reps   Airex heel-toe raises   Toe Raise  --   see above under heel raises   Other Standing Ankle Exercises  Theraband ext with blue band on Airex pad 2x10, pall off press with blue band feet together on Airex x 15 ea. way, TRX squat 2x10    Other Standing Ankle Exercises  Reverse BOSU standing balance at counter 20 sec x 3 with no UE support CGA      Ankle Exercises: Seated   BAPS  Sitting;Level 2;10 reps   10x ea. direction bilat.            PT Education - 12/26/19 1234    Education Details  nerve healing time, exercises    Person(s) Educated  Patient    Methods  Explanation;Verbal cues    Comprehension  Verbalized understanding;Returned demonstration       PT Short Term Goals - 11/29/19 2017      PT SHORT TERM GOAL #1   Title  Independent with HEP    Baseline  updated today from home health HEP    Time  6    Period  Weeks    Status  New      PT SHORT TERM GOAL #2   Title  Independent community ambulation without further need for crutch use    Baseline  single crutch use    Time  6    Period  Weeks      PT SHORT TERM GOAL #3   Title  Increase left ankle DF AROM to at least neutral for improved toe clearance with gait    Baseline  -4 deg    Time  6    Period  Weeks    Status  New        PT Long Term Goals - 12/24/19 1250      PT LONG TERM GOAL #1    Title  Improve FOTO outcome measure score to 26% or less impairment    Baseline  41% limited at eval    Time  12  Period  Weeks    Status  On-going      PT LONG TERM GOAL #2   Title  Bilateral ankle strength 5/5 to improve ability for stair navigation at home and for ankle stability with outdoor ambulation over uneven surfaces    Baseline  see objective, goal ongoing    Time  12    Period  Weeks    Status  On-going      PT LONG TERM GOAL #3   Title  Tolerate community level ambulation with bilat. ankle pain <2/10 at worst    Baseline  pain improving but goal ongoing    Time  12    Period  Weeks    Status  On-going      PT LONG TERM GOAL #4   Title  Ascend/descend 15 steps to 2nd floor of home with reciprocal gait    Baseline  unable to descend with reciprocal gait    Time  12    Period  Weeks    Status  On-going            Plan - 12/26/19 1235    Clinical Impression Statement  Added manual tx. to lower leg muscles to help decrease tightness and muscular component of pain. Otherwise continued progression of balance/proprioceptive challenges and closed chain strengthening focus with good tolerance. Pt. continuing to make good gradual progress re: therapy goals from baseline  status.    Personal Factors and Comorbidities  Other;Time since onset of injury/illness/exacerbation    Examination-Activity Limitations  Squat;Stairs;Locomotion Level;Stand;Carry    Examination-Participation Restrictions  Laundry;Shop;Cleaning;Community Activity;Driving    Stability/Clinical Decision Making  Evolving/Moderate complexity    Clinical Decision Making  Moderate    Rehab Potential  Good    PT Frequency  --   1-2x/week   PT Duration  12 weeks    PT Treatment/Interventions  ADLs/Self Care Home Management;Electrical Stimulation;Cryotherapy;Stair training;Gait training;Functional mobility training;Neuromuscular re-education;Therapeutic activities;Therapeutic exercise;Patient/family  education;Balance training;Manual techniques;Passive range of motion;Taping;Vasopneumatic Device;Moist Heat    PT Next Visit Plan  continue/progress closed chain activities and balance/proprioceptive challenges as tolerated, continue manual/foam roll as found beneficial    PT Home Exercise Plan  ankle Therband 4-way, gastroc stretch longsitting with strap vs. standing if tolerated, standing hip 3-way SLR with Theraband, partial squat, Romberg on cushion, for core strengthening work pelvic tilts, supine marches and standing shoulder extension with Theraband. Toe yoga, towel scrunches.    Consulted and Agree with Plan of Care  Patient       Patient will benefit from skilled therapeutic intervention in order to improve the following deficits and impairments:  Pain, Impaired flexibility, Decreased strength, Decreased activity tolerance, Increased edema, Decreased range of motion, Difficulty walking, Hypomobility, Decreased mobility, Decreased balance, Impaired sensation  Visit Diagnosis: Pain in right ankle and joints of right foot  Pain in left ankle and joints of left foot  Localized edema  Stiffness of left ankle, not elsewhere classified  Stiffness of right ankle, not elsewhere classified  Difficulty in walking, not elsewhere classified  Muscle weakness (generalized)     Problem List Patient Active Problem List   Diagnosis Date Noted  . Type III open pilon fracture of left tibia 07/19/2019  . Acute stress disorder   . Nondisplaced fracture of posterior process of right talus 07/09/2019  . Open pilon fracture, left, type III, initial encounter 07/06/2019  . Trimalleolar fracture of ankle, closed, right, initial encounter 07/06/2019  . Fall from height of greater than 3 feet 07/06/2019  .  Closed compression fracture of body of L1 vertebra ([place]) 07/06/2019  . Open ankle fracture 07/05/2019  . Hyperlipidemia 07/19/2018  . Atypical chest pain 07/19/2018  . GERD (gastroesophageal  reflux disease) 10/28/2015  . Nonspecific abnormal electrocardiogram (ECG) (EKG) 02/11/2015  . Fibroid uterus 01/17/2015  . Anemia, iron deficiency 12/20/2014    Beaulah Dinning, PT, DPT 12/26/19 12:38 PM  [facility] [address] [place], [place], [address] Phone: [phone]   Fax:  [phone]  Name: Ana Freeman MRN: [id number] Date of Birth: 07/23/1971

## 2019-12-31 ENCOUNTER — Ambulatory Visit: Payer: BC Managed Care – PPO | Admitting: Physical Therapy

## 2019-12-31 ENCOUNTER — Encounter: Payer: Self-pay | Admitting: Physical Therapy

## 2019-12-31 ENCOUNTER — Other Ambulatory Visit: Payer: Self-pay

## 2019-12-31 DIAGNOSIS — M25572 Pain in left ankle and joints of left foot: Secondary | ICD-10-CM | POA: Diagnosis not present

## 2019-12-31 DIAGNOSIS — R262 Difficulty in walking, not elsewhere classified: Secondary | ICD-10-CM

## 2019-12-31 DIAGNOSIS — M25571 Pain in right ankle and joints of right foot: Secondary | ICD-10-CM

## 2019-12-31 DIAGNOSIS — M25671 Stiffness of right ankle, not elsewhere classified: Secondary | ICD-10-CM

## 2019-12-31 DIAGNOSIS — M6281 Muscle weakness (generalized): Secondary | ICD-10-CM

## 2019-12-31 DIAGNOSIS — M25672 Stiffness of left ankle, not elsewhere classified: Secondary | ICD-10-CM

## 2019-12-31 DIAGNOSIS — R600 Localized edema: Secondary | ICD-10-CM

## 2019-12-31 NOTE — Therapy (Signed)
[place], [place], [address] Phone: [phone]   Fax:  [phone]  Physical Therapy Treatment  Patient Details  Name: Ana Freeman MRN: [id number] Date of Birth: [birth year] Referring Provider (PT): Katha Hamming, MD   Encounter Date: 12/31/2019  PT End of Session - 12/31/19 1106    Visit Number  8    Number of Visits  24    Date for PT Re-Evaluation  02/21/20    Authorization Type  BCBS    PT Start Time  1103    PT Stop Time  1143    PT Time Calculation (min)  40 min       Past Medical History:  Diagnosis Date  . Anemia   . Brain lesion   . GERD (gastroesophageal reflux disease)   . Headache   . Hyperlipidemia 07/19/2018  . Numbness of lower extremity   . Parathyroid abnormality [facility])     Past Surgical History:  Procedure Laterality Date  . ANKLE CLOSED REDUCTION Right 07/05/2019   Procedure: Closed Reduction Ankle;  Surgeon: Hiram Gash, MD;  Location: [place];  Service: Orthopedics;  Laterality: Right;  . COLONOSCOPY    . DILATATION & CURETTAGE/HYSTEROSCOPY WITH MYOSURE N/A 10/24/2019   Procedure: DILATATION & CURETTAGE/HYSTEROSCOPY WITH MYOSURE;  Surgeon: Waymon Amato, MD;  Location: [place];  Service: Gynecology;  Laterality: N/A;  . DILATION AND EVACUATION N/A 02/12/2016   Procedure: DILATATION AND EVACUATION WITH SUCTION ;  Surgeon: Eldred Manges, MD;  Location: [facility];  Service: Gynecology;  Laterality: N/A;  . EXTERNAL FIXATION LEG Left 07/05/2019   Procedure: EXTERNAL FIXATION LEFT ANKLE;  Surgeon: Hiram Gash, MD;  Location: [place];  Service: Orthopedics;  Laterality: Left;  . EXTERNAL FIXATION REMOVAL Left 07/19/2019   Procedure: REMOVAL EXTERNAL FIXATION LEG;  Surgeon: Shona Needles, MD;  Location: [place];  Service: Orthopedics;  Laterality: Left;  . HYSTEROSCOPY WITH RESECTOSCOPE N/A 02/12/2016   Procedure: HYSTEROSCOPIC MYOMECTOMY WITH RESECTOSCOPE;  Surgeon:  Eldred Manges, MD;  Location: [facility];  Service: Gynecology;  Laterality: N/A;  . I & D EXTREMITY Left 07/05/2019   Procedure: IRRIGATION AND DEBRIDEMENT left ankle ;  Surgeon: Hiram Gash, MD;  Location: [place];  Service: Orthopedics;  Laterality: Left;  . I & D EXTREMITY Left 07/08/2019   Procedure: IRRIGATION AND DEBRIDEMENT EXTREMITY WITH ADJUSTMENT OF EXTERNAL FIXATOR;  Surgeon: Shona Needles, MD;  Location: [place];  Service: Orthopedics;  Laterality: Left;  Marland Kitchen MASS EXCISION Right 08/30/2017   Procedure: EXCISION RIGHT UPPER ARM MASS;  Surgeon: Coralie Keens, MD;  Location: [place];  Service: General;  Laterality: Right;  . MYOMECTOMY N/A 02/12/2016   Procedure: ABDOMINAL MYOMECTOMY;  Surgeon: Eldred Manges, MD;  Location: [facility];  Service: Gynecology;  Laterality: N/A;  . OPEN REDUCTION INTERNAL FIXATION (ORIF) TIBIA/FIBULA FRACTURE Left 07/19/2019   Procedure: OPEN REDUCTION INTERNAL FIXATION (ORIF) LEFT PILON;  Surgeon: Shona Needles, MD;  Location: [place];  Service: Orthopedics;  Laterality: Left;  . ORIF ANKLE FRACTURE Right 07/08/2019   Procedure: OPEN REDUCTION INTERNAL FIXATION (ORIF) ANKLE FRACTURE;  Surgeon: Shona Needles, MD;  Location: [place];  Service: Orthopedics;  Laterality: Right;  . UPPER GI ENDOSCOPY    . WISDOM TOOTH EXTRACTION      There were no vitals filed for this visit.  Subjective Assessment - 12/31/19 1105    Subjective  Foam roller treatment felt  good. No major soreness after last session.    Currently in Pain?  No/denies                       [facility] Adult PT Treatment/Exercise - 12/31/19 0001      Knee/Hip Exercises: Standing   Lateral Step Up  Right;Left;2 sets;10 reps;Hand Hold: 0;Step Height: 6"    Forward Step Up  Right;Left;2 sets;10 reps;20 reps;Step Height: 6"    Forward Step Up Limitations  foam pad on top of 6 inch step     Other Standing Knee Exercises  10# KB squats x 15        Ankle Exercises: Aerobic    Tread Mill  1.6-1.8 mph x 5 min fw, 3 min 0.5-0.8 mph reverse   grade 3      Ankle Exercises: Stretches   Slant Board Stretch  3 reps;30 seconds      Ankle Exercises: Standing   Rocker Board  2 minutes   1 min-dynamic balance fw/rev wooden board,lateral blue board   Heel Raises  20 reps    Toe Raise  20 reps    Heel Walk (Round Trip)  10 ft x 2 -difficult on left     Toe Walk (Round Trip)  10 ft x 2     Other Standing Ankle Exercises  Theraband ext with blue band on Airex pad 2x10, pall off press with blue band feet together on Airex x 15 ea. way, TRX squat 2x10               PT Short Term Goals - 11/29/19 2017      PT SHORT TERM GOAL #1   Title  Independent with HEP    Baseline  updated today from home health HEP    Time  6    Period  Weeks    Status  New      PT SHORT TERM GOAL #2   Title  Independent community ambulation without further need for crutch use    Baseline  single crutch use    Time  6    Period  Weeks      PT SHORT TERM GOAL #3   Title  Increase left ankle DF AROM to at least neutral for improved toe clearance with gait    Baseline  -4 deg    Time  6    Period  Weeks    Status  New        PT Long Term Goals - 12/24/19 1250      PT LONG TERM GOAL #1   Title  Improve FOTO outcome measure score to 26% or less impairment    Baseline  41% limited at eval    Time  12    Period  Weeks    Status  On-going      PT LONG TERM GOAL #2   Title  Bilateral ankle strength 5/5 to improve ability for stair navigation at home and for ankle stability with outdoor ambulation over uneven surfaces    Baseline  see objective, goal ongoing    Time  12    Period  Weeks    Status  On-going      PT LONG TERM GOAL #3   Title  Tolerate community level ambulation with bilat. ankle pain <2/10 at worst    Baseline  pain improving but goal ongoing    Time  12    Period  Weeks  Status  On-going      PT LONG TERM GOAL #4   Title  Ascend/descend 15 steps to  2nd floor of home with reciprocal gait    Baseline  unable to descend with reciprocal gait    Time  12    Period  Weeks    Status  On-going            Plan - 12/31/19 1155    Clinical Impression Statement  Pt reports no major soreness after last session and manual tx felt good. Continued with progressive closed chain LE strength and balance. She tolerated session well. Weakness in PF on Left.    PT Next Visit Plan  continue/progress closed chain activities and balance/proprioceptive challenges as tolerated, continue manual/foam roll as found beneficial    PT Home Exercise Plan  ankle Therband 4-way, gastroc stretch longsitting with strap vs. standing if tolerated, standing hip 3-way SLR with Theraband, partial squat, Romberg on cushion, for core strengthening work pelvic tilts, supine marches and standing shoulder extension with Theraband. Toe yoga, towel scrunches.       Patient will benefit from skilled therapeutic intervention in order to improve the following deficits and impairments:  Pain, Impaired flexibility, Decreased strength, Decreased activity tolerance, Increased edema, Decreased range of motion, Difficulty walking, Hypomobility, Decreased mobility, Decreased balance, Impaired sensation  Visit Diagnosis: Pain in right ankle and joints of right foot  Pain in left ankle and joints of left foot  Localized edema  Stiffness of left ankle, not elsewhere classified  Stiffness of right ankle, not elsewhere classified  Difficulty in walking, not elsewhere classified  Muscle weakness (generalized)     Problem List Patient Active Problem List   Diagnosis Date Noted  . Type III open pilon fracture of left tibia 07/19/2019  . Acute stress disorder   . Nondisplaced fracture of posterior process of right talus 07/09/2019  . Open pilon fracture, left, type III, initial encounter 07/06/2019  . Trimalleolar fracture of ankle, closed, right, initial encounter 07/06/2019  .  Fall from height of greater than 3 feet 07/06/2019  . Closed compression fracture of body of L1 vertebra ([place]) 07/06/2019  . Open ankle fracture 07/05/2019  . Hyperlipidemia 07/19/2018  . Atypical chest pain 07/19/2018  . GERD (gastroesophageal reflux disease) 10/28/2015  . Nonspecific abnormal electrocardiogram (ECG) (EKG) 02/11/2015  . Fibroid uterus 01/17/2015  . Anemia, iron deficiency 12/20/2014    Dorene Ar, PTA 12/31/2019, 12:01 PM  [facility] [address] [place], [place], [address] Phone: [phone]   Fax:  [phone]  Name: Ana Freeman MRN: [id number] Date of Birth: [birth year]

## 2020-01-02 ENCOUNTER — Ambulatory Visit: Payer: BC Managed Care – PPO | Admitting: Physical Therapy

## 2020-01-07 ENCOUNTER — Ambulatory Visit: Payer: BC Managed Care – PPO | Admitting: Physical Therapy

## 2020-01-07 ENCOUNTER — Encounter: Payer: Self-pay | Admitting: Physical Therapy

## 2020-01-07 ENCOUNTER — Other Ambulatory Visit: Payer: Self-pay

## 2020-01-07 DIAGNOSIS — M25572 Pain in left ankle and joints of left foot: Secondary | ICD-10-CM

## 2020-01-07 DIAGNOSIS — M25571 Pain in right ankle and joints of right foot: Secondary | ICD-10-CM

## 2020-01-07 DIAGNOSIS — R262 Difficulty in walking, not elsewhere classified: Secondary | ICD-10-CM

## 2020-01-07 DIAGNOSIS — M25672 Stiffness of left ankle, not elsewhere classified: Secondary | ICD-10-CM

## 2020-01-07 DIAGNOSIS — R600 Localized edema: Secondary | ICD-10-CM

## 2020-01-07 DIAGNOSIS — M25671 Stiffness of right ankle, not elsewhere classified: Secondary | ICD-10-CM

## 2020-01-07 DIAGNOSIS — M6281 Muscle weakness (generalized): Secondary | ICD-10-CM

## 2020-01-07 NOTE — Therapy (Signed)
[place], [place], [address] Phone: [phone]   Fax:  [phone]  Physical Therapy Treatment  Patient Details  Name: Ana Freeman MRN: [id number] Date of Birth: 1971-11-25 Referring Provider (PT): Katha Hamming, MD   Encounter Date: 01/07/2020  PT End of Session - 01/07/20 [id number]    Visit Number  9    Number of Visits  24    Date for PT Re-Evaluation  02/21/20    Authorization Type  BCBS    PT Start Time  1632    PT Stop Time  [id number]    PT Time Calculation (min)  43 min    Activity Tolerance  Patient tolerated treatment well    Behavior During Therapy  [facility] for tasks assessed/performed       Past Medical History:  Diagnosis Date  . Anemia   . Brain lesion   . GERD (gastroesophageal reflux disease)   . Headache   . Hyperlipidemia 07/19/2018  . Numbness of lower extremity   . Parathyroid abnormality [facility])     Past Surgical History:  Procedure Laterality Date  . ANKLE CLOSED REDUCTION Right 07/05/2019   Procedure: Closed Reduction Ankle;  Surgeon: Hiram Gash, MD;  Location: [place];  Service: Orthopedics;  Laterality: Right;  . COLONOSCOPY    . DILATATION & CURETTAGE/HYSTEROSCOPY WITH MYOSURE N/A 10/24/2019   Procedure: DILATATION & CURETTAGE/HYSTEROSCOPY WITH MYOSURE;  Surgeon: Waymon Amato, MD;  Location: [place];  Service: Gynecology;  Laterality: N/A;  . DILATION AND EVACUATION N/A 02/12/2016   Procedure: DILATATION AND EVACUATION WITH SUCTION ;  Surgeon: Eldred Manges, MD;  Location: [place] [facility];  Service: Gynecology;  Laterality: N/A;  . EXTERNAL FIXATION LEG Left 07/05/2019   Procedure: EXTERNAL FIXATION LEFT ANKLE;  Surgeon: Hiram Gash, MD;  Location: [place];  Service: Orthopedics;  Laterality: Left;  . EXTERNAL FIXATION REMOVAL Left 07/19/2019   Procedure: REMOVAL EXTERNAL FIXATION LEG;  Surgeon: Shona Needles, MD;  Location: [place];  Service: Orthopedics;  Laterality: Left;   . HYSTEROSCOPY WITH RESECTOSCOPE N/A 02/12/2016   Procedure: HYSTEROSCOPIC MYOMECTOMY WITH RESECTOSCOPE;  Surgeon: Eldred Manges, MD;  Location: [place] [facility];  Service: Gynecology;  Laterality: N/A;  . I & D EXTREMITY Left 07/05/2019   Procedure: IRRIGATION AND DEBRIDEMENT left ankle ;  Surgeon: Hiram Gash, MD;  Location: [place];  Service: Orthopedics;  Laterality: Left;  . I & D EXTREMITY Left 07/08/2019   Procedure: IRRIGATION AND DEBRIDEMENT EXTREMITY WITH ADJUSTMENT OF EXTERNAL FIXATOR;  Surgeon: Shona Needles, MD;  Location: [place];  Service: Orthopedics;  Laterality: Left;  Marland Kitchen MASS EXCISION Right 08/30/2017   Procedure: EXCISION RIGHT UPPER ARM MASS;  Surgeon: Coralie Keens, MD;  Location: [place];  Service: General;  Laterality: Right;  . MYOMECTOMY N/A 02/12/2016   Procedure: ABDOMINAL MYOMECTOMY;  Surgeon: Eldred Manges, MD;  Location: [place] [facility];  Service: Gynecology;  Laterality: N/A;  . OPEN REDUCTION INTERNAL FIXATION (ORIF) TIBIA/FIBULA FRACTURE Left 07/19/2019   Procedure: OPEN REDUCTION INTERNAL FIXATION (ORIF) LEFT PILON;  Surgeon: Shona Needles, MD;  Location: [place];  Service: Orthopedics;  Laterality: Left;  . ORIF ANKLE FRACTURE Right 07/08/2019   Procedure: OPEN REDUCTION INTERNAL FIXATION (ORIF) ANKLE FRACTURE;  Surgeon: Shona Needles, MD;  Location: [place];  Service: Orthopedics;  Laterality: Right;  . UPPER GI ENDOSCOPY    . WISDOM TOOTH EXTRACTION      There were  no vitals filed for this visit.  Subjective Assessment - 01/07/20 1635    Subjective  Pt. dealing with some TMJ issues but ankle feeling in term of pain. Left>right LE weakness is predominant factor limiting functional status.    Currently in Pain?  No/denies         [facility] PT Assessment - 01/07/20 0001      Observation/Other Assessments   Focus on Therapeutic Outcomes (FOTO)   33% limited                   [facility] Adult PT Treatment/Exercise - 01/07/20 0001      Knee/Hip  Exercises: Machines for Strengthening   Cybex Leg Press  60 lbs. bilat. LE 3x10, also performed heel raises 60 lbs. 2x10      Knee/Hip Exercises: Standing   Lateral Step Up Limitations  lateral step up/over 6 in. step x 20 reps    Functional Squat Limitations  TRX squat 2x10    Other Standing Knee Exercises  TRX reverse lunge x 10 ea. bilat.      Manual Therapy   Soft tissue mobilization  IASTM/roller use bilat. gastroc region      Ankle Exercises: Aerobic   Elliptical  L1 x 5 min started grade 10 but decreased to between 3-5 due to fatigue      Ankle Exercises: Stretches   Slant Board Stretch  3 reps;30 seconds      Ankle Exercises: Standing   Rocker Board  2 minutes   1 min-dynamic balance fw/rev wooden board,lateral blue board   Heel Raises  Both;20 reps   on Airex with 6 lb. DB ea. UE bilat.   Other Standing Ankle Exercises  walking with cable resistance 10 lbs. fw/rev 3 ea. and side stepping x 2 ea. bilat.               PT Short Term Goals - 11/29/19 2017      PT SHORT TERM GOAL #1   Title  Independent with HEP    Baseline  updated today from home health HEP    Time  6    Period  Weeks    Status  New      PT SHORT TERM GOAL #2   Title  Independent community ambulation without further need for crutch use    Baseline  single crutch use    Time  6    Period  Weeks      PT SHORT TERM GOAL #3   Title  Increase left ankle DF AROM to at least neutral for improved toe clearance with gait    Baseline  -4 deg    Time  6    Period  Weeks    Status  New        PT Long Term Goals - 01/07/20 2006      PT LONG TERM GOAL #1   Title  Improve FOTO outcome measure score to 26% or less impairment    Baseline  41% limited at eval, current limitation 33%    Time  12    Period  Weeks    Status  On-going      PT LONG TERM GOAL #2   Title  Bilateral ankle strength 5/5 to improve ability for stair navigation at home and for ankle stability with outdoor ambulation over  uneven surfaces    Baseline  see objective, goal ongoing    Time  12    Period  Weeks  Status  On-going      PT LONG TERM GOAL #3   Title  Tolerate community level ambulation with bilat. ankle pain <2/10 at worst    Baseline  pain improving but goal ongoing    Time  12    Period  Weeks    Status  On-going      PT LONG TERM GOAL #4   Title  Ascend/descend 15 steps to 2nd floor of home with reciprocal gait    Baseline  unable to descend with reciprocal gait    Time  12    Period  Weeks    Status  On-going            Plan - 01/07/20 2002    Clinical Impression Statement  Added machine strengthening today to standing exercises and progressed CKC activities with partial lunge. Strength improving but still with left>right LE weakness causing functional limitations for mobility.    Personal Factors and Comorbidities  Other;Time since onset of injury/illness/exacerbation    Examination-Activity Limitations  Squat;Stairs;Locomotion Level;Stand;Carry    Examination-Participation Restrictions  Laundry;Shop;Cleaning;Community Activity;Driving    Stability/Clinical Decision Making  Evolving/Moderate complexity    Clinical Decision Making  Moderate    Rehab Potential  Good    PT Frequency  --   1-2x/week   PT Duration  12 weeks    PT Treatment/Interventions  ADLs/Self Care Home Management;Electrical Stimulation;Cryotherapy;Stair training;Gait training;Functional mobility training;Neuromuscular re-education;Therapeutic activities;Therapeutic exercise;Patient/family education;Balance training;Manual techniques;Passive range of motion;Taping;Vasopneumatic Device;Moist Heat;Visual/perceptual remediation/compensation    PT Next Visit Plan  continue/progress closed chain activities and balance/proprioceptive challenges as tolerated, continue manual/foam roll as found beneficial    PT Home Exercise Plan  ankle Theraband 4-way, gastroc stretch longsitting with strap vs. standing if tolerated,  standing hip 3-way SLR with Theraband, partial squat, Romberg on cushion, for core strengthening work pelvic tilts, supine marches and standing shoulder extension with Theraband. Toe yoga, towel scrunches.    Consulted and Agree with Plan of Care  Patient       Patient will benefit from skilled therapeutic intervention in order to improve the following deficits and impairments:  Pain, Impaired flexibility, Decreased strength, Decreased activity tolerance, Increased edema, Decreased range of motion, Difficulty walking, Hypomobility, Decreased mobility, Decreased balance, Impaired sensation  Visit Diagnosis: Pain in right ankle and joints of right foot  Pain in left ankle and joints of left foot  Localized edema  Stiffness of left ankle, not elsewhere classified  Stiffness of right ankle, not elsewhere classified  Difficulty in walking, not elsewhere classified  Muscle weakness (generalized)     Problem List Patient Active Problem List   Diagnosis Date Noted  . Type III open pilon fracture of left tibia 07/19/2019  . Acute stress disorder   . Nondisplaced fracture of posterior process of right talus 07/09/2019  . Open pilon fracture, left, type III, initial encounter 07/06/2019  . Trimalleolar fracture of ankle, closed, right, initial encounter 07/06/2019  . Fall from height of greater than 3 feet 07/06/2019  . Closed compression fracture of body of L1 vertebra ([place]) 07/06/2019  . Open ankle fracture 07/05/2019  . Hyperlipidemia 07/19/2018  . Atypical chest pain 07/19/2018  . GERD (gastroesophageal reflux disease) 10/28/2015  . Nonspecific abnormal electrocardiogram (ECG) (EKG) 02/11/2015  . Fibroid uterus 01/17/2015  . Anemia, iron deficiency 12/20/2014    Beaulah Dinning, PT, DPT 01/07/20 8:07 PM  [facility] [address] [place], [place], [address] Phone: [phone]   Fax:  [phone]  Name: Ana Freeman MRN: [id number] Date of Birth: 08/15/1971

## 2020-01-09 ENCOUNTER — Encounter: Payer: Self-pay | Admitting: Physical Therapy

## 2020-01-09 ENCOUNTER — Other Ambulatory Visit: Payer: Self-pay

## 2020-01-09 ENCOUNTER — Ambulatory Visit: Payer: BC Managed Care – PPO | Admitting: Physical Therapy

## 2020-01-09 DIAGNOSIS — R262 Difficulty in walking, not elsewhere classified: Secondary | ICD-10-CM

## 2020-01-09 DIAGNOSIS — M25671 Stiffness of right ankle, not elsewhere classified: Secondary | ICD-10-CM

## 2020-01-09 DIAGNOSIS — M25572 Pain in left ankle and joints of left foot: Secondary | ICD-10-CM | POA: Diagnosis not present

## 2020-01-09 DIAGNOSIS — M6281 Muscle weakness (generalized): Secondary | ICD-10-CM

## 2020-01-09 DIAGNOSIS — M25672 Stiffness of left ankle, not elsewhere classified: Secondary | ICD-10-CM

## 2020-01-09 DIAGNOSIS — R600 Localized edema: Secondary | ICD-10-CM

## 2020-01-09 DIAGNOSIS — M25571 Pain in right ankle and joints of right foot: Secondary | ICD-10-CM

## 2020-01-09 NOTE — Therapy (Signed)
[place], [place], [address] Phone: [phone]   Fax:  [phone]  Physical Therapy Treatment  Patient Details  Name: Ana Freeman MRN: [id number] Date of Birth: 11/18/1971 Referring Provider (PT): Katha Hamming, MD   Encounter Date: 01/09/2020  PT End of Session - 01/09/20 1234    Visit Number  10    Number of Visits  24    Date for PT Re-Evaluation  02/21/20    Authorization Type  BCBS    PT Start Time  1147    PT Stop Time  1230    PT Time Calculation (min)  43 min    Activity Tolerance  Patient tolerated treatment well    Behavior During Therapy  [facility] for tasks assessed/performed       Past Medical History:  Diagnosis Date  . Anemia   . Brain lesion   . GERD (gastroesophageal reflux disease)   . Headache   . Hyperlipidemia 07/19/2018  . Numbness of lower extremity   . Parathyroid abnormality [facility])     Past Surgical History:  Procedure Laterality Date  . ANKLE CLOSED REDUCTION Right 07/05/2019   Procedure: Closed Reduction Ankle;  Surgeon: Hiram Gash, MD;  Location: [place];  Service: Orthopedics;  Laterality: Right;  . COLONOSCOPY    . DILATATION & CURETTAGE/HYSTEROSCOPY WITH MYOSURE N/A 10/24/2019   Procedure: DILATATION & CURETTAGE/HYSTEROSCOPY WITH MYOSURE;  Surgeon: Waymon Amato, MD;  Location: [place];  Service: Gynecology;  Laterality: N/A;  . DILATION AND EVACUATION N/A 02/12/2016   Procedure: DILATATION AND EVACUATION WITH SUCTION ;  Surgeon: Eldred Manges, MD;  Location: [place] [facility];  Service: Gynecology;  Laterality: N/A;  . EXTERNAL FIXATION LEG Left 07/05/2019   Procedure: EXTERNAL FIXATION LEFT ANKLE;  Surgeon: Hiram Gash, MD;  Location: [place];  Service: Orthopedics;  Laterality: Left;  . EXTERNAL FIXATION REMOVAL Left 07/19/2019   Procedure: REMOVAL EXTERNAL FIXATION LEG;  Surgeon: Shona Needles, MD;  Location: [place];  Service: Orthopedics;  Laterality:  Left;  . HYSTEROSCOPY WITH RESECTOSCOPE N/A 02/12/2016   Procedure: HYSTEROSCOPIC MYOMECTOMY WITH RESECTOSCOPE;  Surgeon: Eldred Manges, MD;  Location: [facility];  Service: Gynecology;  Laterality: N/A;  . I & D EXTREMITY Left 07/05/2019   Procedure: IRRIGATION AND DEBRIDEMENT left ankle ;  Surgeon: Hiram Gash, MD;  Location: [place];  Service: Orthopedics;  Laterality: Left;  . I & D EXTREMITY Left 07/08/2019   Procedure: IRRIGATION AND DEBRIDEMENT EXTREMITY WITH ADJUSTMENT OF EXTERNAL FIXATOR;  Surgeon: Shona Needles, MD;  Location: [place];  Service: Orthopedics;  Laterality: Left;  Marland Kitchen MASS EXCISION Right 08/30/2017   Procedure: EXCISION RIGHT UPPER ARM MASS;  Surgeon: Coralie Keens, MD;  Location: [place];  Service: General;  Laterality: Right;  . MYOMECTOMY N/A 02/12/2016   Procedure: ABDOMINAL MYOMECTOMY;  Surgeon: Eldred Manges, MD;  Location: [place] [facility];  Service: Gynecology;  Laterality: N/A;  . OPEN REDUCTION INTERNAL FIXATION (ORIF) TIBIA/FIBULA FRACTURE Left 07/19/2019   Procedure: OPEN REDUCTION INTERNAL FIXATION (ORIF) LEFT PILON;  Surgeon: Shona Needles, MD;  Location: [place];  Service: Orthopedics;  Laterality: Left;  . ORIF ANKLE FRACTURE Right 07/08/2019   Procedure: OPEN REDUCTION INTERNAL FIXATION (ORIF) ANKLE FRACTURE;  Surgeon: Shona Needles, MD;  Location: [place];  Service: Orthopedics;  Laterality: Right;  . UPPER GI ENDOSCOPY    . WISDOM TOOTH EXTRACTION      There were  no vitals filed for this visit.  Subjective Assessment - 01/09/20 1147    Subjective  No major soreness after last session with exercise progression. No new complaints/concerns otherwise this AM.                       [facility] Adult PT Treatment/Exercise - 01/09/20 0001      Knee/Hip Exercises: Machines for Strengthening   Cybex Leg Press  60 lbs. bilat. LE 3x10, also performed heel raises 60 lbs. 2x10      Knee/Hip Exercises: Standing   Forward Step Up  Right;Left;20  reps;Hand Hold: 1;Step Height: 8"    Step Down  Right;Left;1 set;10 reps;Hand Hold: 0;Step Height: 2"    Functional Squat Limitations  "Touch and go" front squat with 10 lb. KB 2x10 to incline wedge on low mat      Manual Therapy   Soft tissue mobilization  IASTM with "tiger tail" roller bilat. gastrocs and tibialis anterior      Ankle Exercises: Aerobic   Elliptical  L1 x 5 min ramp 5      Ankle Exercises: Stretches   Slant Board Stretch  3 reps;30 seconds      Ankle Exercises: Standing   Rocker Board  2 minutes   blue"circle"board1 min-dynamic balance fw/rev, side to side   Rebounder  30 throws with 1000g ball in partial tandem stance on Airex    Heel Raises  Both;20 reps   heel/toe raises with 6 lb. weight ea. UE   Other Standing Ankle Exercises  Pall off press on Airex partial tandem stance green band x 15 ea. way    Other Standing Ankle Exercises  walking with cable resistance 10 lbs. fw/rev 3 ea. and side stepping x 2 ea. bilat.             PT Education - 01/09/20 1233    Education Details  exercises, POC    Person(s) Educated  Patient    Methods  Explanation;Demonstration    Comprehension  Verbalized understanding;Returned demonstration       PT Short Term Goals - 11/29/19 2017      PT SHORT TERM GOAL #1   Title  Independent with HEP    Baseline  updated today from home health HEP    Time  6    Period  Weeks    Status  New      PT SHORT TERM GOAL #2   Title  Independent community ambulation without further need for crutch use    Baseline  single crutch use    Time  6    Period  Weeks      PT SHORT TERM GOAL #3   Title  Increase left ankle DF AROM to at least neutral for improved toe clearance with gait    Baseline  -4 deg    Time  6    Period  Weeks    Status  New        PT Long Term Goals - 01/07/20 2006      PT LONG TERM GOAL #1   Title  Improve FOTO outcome measure score to 26% or less impairment    Baseline  41% limited at eval, current  limitation 33%    Time  12    Period  Weeks    Status  On-going      PT LONG TERM GOAL #2   Title  Bilateral ankle strength 5/5 to improve ability for stair  navigation at home and for ankle stability with outdoor ambulation over uneven surfaces    Baseline  see objective, goal ongoing    Time  12    Period  Weeks    Status  On-going      PT LONG TERM GOAL #3   Title  Tolerate community level ambulation with bilat. ankle pain <2/10 at worst    Baseline  pain improving but goal ongoing    Time  12    Period  Weeks    Status  On-going      PT LONG TERM GOAL #4   Title  Ascend/descend 15 steps to 2nd floor of home with reciprocal gait    Baseline  unable to descend with reciprocal gait    Time  12    Period  Weeks    Status  On-going            Plan - 01/09/20 1234    Clinical Impression Statement  Continued CKC activity progression with addition step down-able with some mild soreness on 2 in. step but still functionally limited with "step down" for stair navigation. Continued previous work on LE strengthening and manual tx. to address local muscle tightness with good tolerance. Overall pt. continues to make gradual progress regarding therapy goals.    Personal Factors and Comorbidities  Other;Time since onset of injury/illness/exacerbation    Examination-Activity Limitations  Squat;Stairs;Locomotion Level;Stand;Carry    Examination-Participation Restrictions  Laundry;Shop;Cleaning;Community Activity;Driving    Stability/Clinical Decision Making  Evolving/Moderate complexity    Clinical Decision Making  Moderate    Rehab Potential  Good    PT Frequency  --   1-2x/week   PT Duration  12 weeks    PT Treatment/Interventions  ADLs/Self Care Home Management;Electrical Stimulation;Cryotherapy;Stair training;Gait training;Functional mobility training;Neuromuscular re-education;Therapeutic activities;Therapeutic exercise;Patient/family education;Balance training;Manual  techniques;Passive range of motion;Taping;Vasopneumatic Device;Moist Heat;Visual/perceptual remediation/compensation    PT Next Visit Plan  continue/progress closed chain activities and balance/proprioceptive challenges as tolerated, continue manual/foam roll as found beneficial    PT Home Exercise Plan  ankle Theraband 4-way, gastroc stretch longsitting with strap vs. standing if tolerated, standing hip 3-way SLR with Theraband, partial squat, Romberg on cushion, for core strengthening work pelvic tilts, supine marches and standing shoulder extension with Theraband. Toe yoga, towel scrunches.    Consulted and Agree with Plan of Care  Patient       Patient will benefit from skilled therapeutic intervention in order to improve the following deficits and impairments:  Pain, Impaired flexibility, Decreased strength, Decreased activity tolerance, Increased edema, Decreased range of motion, Difficulty walking, Hypomobility, Decreased mobility, Decreased balance, Impaired sensation  Visit Diagnosis: Pain in right ankle and joints of right foot  Pain in left ankle and joints of left foot  Localized edema  Stiffness of left ankle, not elsewhere classified  Stiffness of right ankle, not elsewhere classified  Difficulty in walking, not elsewhere classified  Muscle weakness (generalized)     Problem List Patient Active Problem List   Diagnosis Date Noted  . Type III open pilon fracture of left tibia 07/19/2019  . Acute stress disorder   . Nondisplaced fracture of posterior process of right talus 07/09/2019  . Open pilon fracture, left, type III, initial encounter 07/06/2019  . Trimalleolar fracture of ankle, closed, right, initial encounter 07/06/2019  . Fall from height of greater than 3 feet 07/06/2019  . Closed compression fracture of body of L1 vertebra ([place]) 07/06/2019  . Open ankle fracture 07/05/2019  . Hyperlipidemia 07/19/2018  .  Atypical chest pain 07/19/2018  . GERD  (gastroesophageal reflux disease) 10/28/2015  . Nonspecific abnormal electrocardiogram (ECG) (EKG) 02/11/2015  . Fibroid uterus 01/17/2015  . Anemia, iron deficiency 12/20/2014    Beaulah Dinning, PT, DPT 01/09/20 12:37 PM  [facility] [address] [place], [place], [address] Phone: [phone]   Fax:  [phone]  Name: Ana Freeman MRN: [id number] Date of Birth: Jun 07, 1971

## 2020-01-21 ENCOUNTER — Ambulatory Visit: Payer: BC Managed Care – PPO | Attending: Student | Admitting: Physical Therapy

## 2020-01-21 ENCOUNTER — Other Ambulatory Visit: Payer: Self-pay

## 2020-01-21 DIAGNOSIS — M25671 Stiffness of right ankle, not elsewhere classified: Secondary | ICD-10-CM | POA: Insufficient documentation

## 2020-01-21 DIAGNOSIS — M25572 Pain in left ankle and joints of left foot: Secondary | ICD-10-CM | POA: Diagnosis present

## 2020-01-21 DIAGNOSIS — M25672 Stiffness of left ankle, not elsewhere classified: Secondary | ICD-10-CM | POA: Diagnosis present

## 2020-01-21 DIAGNOSIS — M6281 Muscle weakness (generalized): Secondary | ICD-10-CM | POA: Diagnosis present

## 2020-01-21 DIAGNOSIS — M25571 Pain in right ankle and joints of right foot: Secondary | ICD-10-CM | POA: Diagnosis not present

## 2020-01-21 DIAGNOSIS — R262 Difficulty in walking, not elsewhere classified: Secondary | ICD-10-CM

## 2020-01-21 DIAGNOSIS — R600 Localized edema: Secondary | ICD-10-CM | POA: Diagnosis present

## 2020-01-21 NOTE — Therapy (Signed)
[place], [place], [address] Phone: [phone]   Fax:  [phone]  Physical Therapy Treatment  Patient Details  Name: Ana Freeman MRN: [id number] Date of Birth: 1971/08/17 Referring Provider (PT): Katha Hamming, MD   Encounter Date: 01/21/2020  PT End of Session - 01/21/20 1254    Visit Number  11    Number of Visits  24    Date for PT Re-Evaluation  02/21/20    Authorization Type  BCBS    PT Start Time  [id number]    PT Stop Time  1231    PT Time Calculation (min)  38 min       Past Medical History:  Diagnosis Date  . Anemia   . Brain lesion   . GERD (gastroesophageal reflux disease)   . Headache   . Hyperlipidemia 07/19/2018  . Numbness of lower extremity   . Parathyroid abnormality [facility])     Past Surgical History:  Procedure Laterality Date  . ANKLE CLOSED REDUCTION Right 07/05/2019   Procedure: Closed Reduction Ankle;  Surgeon: Hiram Gash, MD;  Location: [place];  Service: Orthopedics;  Laterality: Right;  . COLONOSCOPY    . DILATATION & CURETTAGE/HYSTEROSCOPY WITH MYOSURE N/A 10/24/2019   Procedure: DILATATION & CURETTAGE/HYSTEROSCOPY WITH MYOSURE;  Surgeon: Waymon Amato, MD;  Location: [place];  Service: Gynecology;  Laterality: N/A;  . DILATION AND EVACUATION N/A 02/12/2016   Procedure: DILATATION AND EVACUATION WITH SUCTION ;  Surgeon: Eldred Manges, MD;  Location: [place] [facility];  Service: Gynecology;  Laterality: N/A;  . EXTERNAL FIXATION LEG Left 07/05/2019   Procedure: EXTERNAL FIXATION LEFT ANKLE;  Surgeon: Hiram Gash, MD;  Location: [place];  Service: Orthopedics;  Laterality: Left;  . EXTERNAL FIXATION REMOVAL Left 07/19/2019   Procedure: REMOVAL EXTERNAL FIXATION LEG;  Surgeon: Shona Needles, MD;  Location: [place];  Service: Orthopedics;  Laterality: Left;  . HYSTEROSCOPY WITH RESECTOSCOPE N/A 02/12/2016   Procedure: HYSTEROSCOPIC MYOMECTOMY WITH RESECTOSCOPE;  Surgeon:  Eldred Manges, MD;  Location: [place] [facility];  Service: Gynecology;  Laterality: N/A;  . I & D EXTREMITY Left 07/05/2019   Procedure: IRRIGATION AND DEBRIDEMENT left ankle ;  Surgeon: Hiram Gash, MD;  Location: [place];  Service: Orthopedics;  Laterality: Left;  . I & D EXTREMITY Left 07/08/2019   Procedure: IRRIGATION AND DEBRIDEMENT EXTREMITY WITH ADJUSTMENT OF EXTERNAL FIXATOR;  Surgeon: Shona Needles, MD;  Location: [place];  Service: Orthopedics;  Laterality: Left;  Marland Kitchen MASS EXCISION Right 08/30/2017   Procedure: EXCISION RIGHT UPPER ARM MASS;  Surgeon: Coralie Keens, MD;  Location: [place];  Service: General;  Laterality: Right;  . MYOMECTOMY N/A 02/12/2016   Procedure: ABDOMINAL MYOMECTOMY;  Surgeon: Eldred Manges, MD;  Location: [place] [facility];  Service: Gynecology;  Laterality: N/A;  . OPEN REDUCTION INTERNAL FIXATION (ORIF) TIBIA/FIBULA FRACTURE Left 07/19/2019   Procedure: OPEN REDUCTION INTERNAL FIXATION (ORIF) LEFT PILON;  Surgeon: Shona Needles, MD;  Location: [place];  Service: Orthopedics;  Laterality: Left;  . ORIF ANKLE FRACTURE Right 07/08/2019   Procedure: OPEN REDUCTION INTERNAL FIXATION (ORIF) ANKLE FRACTURE;  Surgeon: Shona Needles, MD;  Location: [place];  Service: Orthopedics;  Laterality: Right;  . UPPER GI ENDOSCOPY    . WISDOM TOOTH EXTRACTION      There were no vitals filed for this visit.  [place] Adult PT Treatment/Exercise - 01/21/20 0001      Knee/Hip Exercises: Standing   Lateral Step Up  Right;Left;2 sets;10 reps;Hand Hold: 0;Step Height: 6"    Forward Step Up  Right;Left;20 reps;Hand Hold: 1;Step Height: 8"    Other Standing Knee Exercises  cone drill- single leg squat to touch cone - pole used for stability      Ankle Exercises: Standing   SLS  on and off foam    Rocker Board  2 minutes   wooden rocker   Rebounder  tandem stance, attempted foam    30 throws with feet together on Airex with 1000 g ball   Heel  Raises  Both;20 reps    Toe Raise  20 reps   box shifts   Other Standing Ankle Exercises  tandem gait       Ankle Exercises: Aerobic   Elliptical  Ramp 1 Resistance 4 5 minutes       Ankle Exercises: Stretches   Slant Board Stretch  3 reps;30 seconds               PT Short Term Goals - 11/29/19 2017      PT SHORT TERM GOAL #1   Title  Independent with HEP    Baseline  updated today from home health HEP    Time  6    Period  Weeks    Status  New      PT SHORT TERM GOAL #2   Title  Independent community ambulation without further need for crutch use    Baseline  single crutch use    Time  6    Period  Weeks      PT SHORT TERM GOAL #3   Title  Increase left ankle DF AROM to at least neutral for improved toe clearance with gait    Baseline  -4 deg    Time  6    Period  Weeks    Status  New        PT Long Term Goals - 01/07/20 2006      PT LONG TERM GOAL #1   Title  Improve FOTO outcome measure score to 26% or less impairment    Baseline  41% limited at eval, current limitation 33%    Time  12    Period  Weeks    Status  On-going      PT LONG TERM GOAL #2   Title  Bilateral ankle strength 5/5 to improve ability for stair navigation at home and for ankle stability with outdoor ambulation over uneven surfaces    Baseline  see objective, goal ongoing    Time  12    Period  Weeks    Status  On-going      PT LONG TERM GOAL #3   Title  Tolerate community level ambulation with bilat. ankle pain <2/10 at worst    Baseline  pain improving but goal ongoing    Time  12    Period  Weeks    Status  On-going      PT LONG TERM GOAL #4   Title  Ascend/descend 15 steps to 2nd floor of home with reciprocal gait    Baseline  unable to descend with reciprocal gait    Time  12    Period  Weeks    Status  On-going            Plan - 01/21/20 1314    Clinical Impression  Statement  Progressed with closed chain/SLS activities. She tolerated all therex well and  demonstrates decreased stability in dynamic SLS activities.    PT Next Visit Plan  continue/progress closed chain activities and balance/proprioceptive challenges as tolerated, continue manual/foam roll as found beneficial    PT Home Exercise Plan  ankle Theraband 4-way, gastroc stretch longsitting with strap vs. standing if tolerated, standing hip 3-way SLR with Theraband, partial squat, Romberg on cushion, for core strengthening work pelvic tilts, supine marches and standing shoulder extension with Theraband. Toe yoga, towel scrunches.       Patient will benefit from skilled therapeutic intervention in order to improve the following deficits and impairments:  Pain, Impaired flexibility, Decreased strength, Decreased activity tolerance, Increased edema, Decreased range of motion, Difficulty walking, Hypomobility, Decreased mobility, Decreased balance, Impaired sensation  Visit Diagnosis: Pain in right ankle and joints of right foot  Pain in left ankle and joints of left foot  Localized edema  Stiffness of left ankle, not elsewhere classified  Stiffness of right ankle, not elsewhere classified  Difficulty in walking, not elsewhere classified  Muscle weakness (generalized)     Problem List Patient Active Problem List   Diagnosis Date Noted  . Type III open pilon fracture of left tibia 07/19/2019  . Acute stress disorder   . Nondisplaced fracture of posterior process of right talus 07/09/2019  . Open pilon fracture, left, type III, initial encounter 07/06/2019  . Trimalleolar fracture of ankle, closed, right, initial encounter 07/06/2019  . Fall from height of greater than 3 feet 07/06/2019  . Closed compression fracture of body of L1 vertebra ([place]) 07/06/2019  . Open ankle fracture 07/05/2019  . Hyperlipidemia 07/19/2018  . Atypical chest pain 07/19/2018  . GERD (gastroesophageal reflux disease) 10/28/2015  . Nonspecific abnormal electrocardiogram (ECG) (EKG) 02/11/2015  .  Fibroid uterus 01/17/2015  . Anemia, iron deficiency 12/20/2014    Dorene Ar, PTA 01/21/2020, 1:16 PM  [facility] [address] [place], [place], [address] Phone: [phone]   Fax:  [phone]  Name: TYLECIA KINMAN MRN: [id number] Date of Birth: [birth year]

## 2020-01-23 ENCOUNTER — Ambulatory Visit: Payer: BC Managed Care – PPO | Admitting: Physical Therapy

## 2020-01-23 ENCOUNTER — Other Ambulatory Visit: Payer: Self-pay

## 2020-01-23 DIAGNOSIS — M6281 Muscle weakness (generalized): Secondary | ICD-10-CM

## 2020-01-23 DIAGNOSIS — M25671 Stiffness of right ankle, not elsewhere classified: Secondary | ICD-10-CM

## 2020-01-23 DIAGNOSIS — R262 Difficulty in walking, not elsewhere classified: Secondary | ICD-10-CM

## 2020-01-23 DIAGNOSIS — M25572 Pain in left ankle and joints of left foot: Secondary | ICD-10-CM

## 2020-01-23 DIAGNOSIS — M25571 Pain in right ankle and joints of right foot: Secondary | ICD-10-CM

## 2020-01-23 DIAGNOSIS — M25672 Stiffness of left ankle, not elsewhere classified: Secondary | ICD-10-CM

## 2020-01-23 DIAGNOSIS — R600 Localized edema: Secondary | ICD-10-CM

## 2020-01-23 NOTE — Therapy (Signed)
[place], [place], [address] Phone: [phone]   Fax:  [phone]  Physical Therapy Treatment  Patient Details  Name: Ana Freeman MRN: [id number] Date of Birth: 11-Feb-1971 Referring Provider (PT): Katha Hamming, MD   Encounter Date: 01/23/2020  PT End of Session - 01/23/20 1223    Visit Number  12    Number of Visits  24    Date for PT Re-Evaluation  02/21/20    Authorization Type  BCBS    PT Start Time  [id number]    PT Stop Time  1232    PT Time Calculation (min)  38 min       Past Medical History:  Diagnosis Date  . Anemia   . Brain lesion   . GERD (gastroesophageal reflux disease)   . Headache   . Hyperlipidemia 07/19/2018  . Numbness of lower extremity   . Parathyroid abnormality [facility])     Past Surgical History:  Procedure Laterality Date  . ANKLE CLOSED REDUCTION Right 07/05/2019   Procedure: Closed Reduction Ankle;  Surgeon: Hiram Gash, MD;  Location: [place];  Service: Orthopedics;  Laterality: Right;  . COLONOSCOPY    . DILATATION & CURETTAGE/HYSTEROSCOPY WITH MYOSURE N/A 10/24/2019   Procedure: DILATATION & CURETTAGE/HYSTEROSCOPY WITH MYOSURE;  Surgeon: Waymon Amato, MD;  Location: [place];  Service: Gynecology;  Laterality: N/A;  . DILATION AND EVACUATION N/A 02/12/2016   Procedure: DILATATION AND EVACUATION WITH SUCTION ;  Surgeon: Eldred Manges, MD;  Location: [place] [facility];  Service: Gynecology;  Laterality: N/A;  . EXTERNAL FIXATION LEG Left 07/05/2019   Procedure: EXTERNAL FIXATION LEFT ANKLE;  Surgeon: Hiram Gash, MD;  Location: [place];  Service: Orthopedics;  Laterality: Left;  . EXTERNAL FIXATION REMOVAL Left 07/19/2019   Procedure: REMOVAL EXTERNAL FIXATION LEG;  Surgeon: Shona Needles, MD;  Location: [place];  Service: Orthopedics;  Laterality: Left;  . HYSTEROSCOPY WITH RESECTOSCOPE N/A 02/12/2016   Procedure: HYSTEROSCOPIC MYOMECTOMY WITH RESECTOSCOPE;  Surgeon:  Eldred Manges, MD;  Location: [place] [facility];  Service: Gynecology;  Laterality: N/A;  . I & D EXTREMITY Left 07/05/2019   Procedure: IRRIGATION AND DEBRIDEMENT left ankle ;  Surgeon: Hiram Gash, MD;  Location: [place];  Service: Orthopedics;  Laterality: Left;  . I & D EXTREMITY Left 07/08/2019   Procedure: IRRIGATION AND DEBRIDEMENT EXTREMITY WITH ADJUSTMENT OF EXTERNAL FIXATOR;  Surgeon: Shona Needles, MD;  Location: [place];  Service: Orthopedics;  Laterality: Left;  Marland Kitchen MASS EXCISION Right 08/30/2017   Procedure: EXCISION RIGHT UPPER ARM MASS;  Surgeon: Coralie Keens, MD;  Location: [place];  Service: General;  Laterality: Right;  . MYOMECTOMY N/A 02/12/2016   Procedure: ABDOMINAL MYOMECTOMY;  Surgeon: Eldred Manges, MD;  Location: [place] [facility];  Service: Gynecology;  Laterality: N/A;  . OPEN REDUCTION INTERNAL FIXATION (ORIF) TIBIA/FIBULA FRACTURE Left 07/19/2019   Procedure: OPEN REDUCTION INTERNAL FIXATION (ORIF) LEFT PILON;  Surgeon: Shona Needles, MD;  Location: [place];  Service: Orthopedics;  Laterality: Left;  . ORIF ANKLE FRACTURE Right 07/08/2019   Procedure: OPEN REDUCTION INTERNAL FIXATION (ORIF) ANKLE FRACTURE;  Surgeon: Shona Needles, MD;  Location: [place];  Service: Orthopedics;  Laterality: Right;  . UPPER GI ENDOSCOPY    . WISDOM TOOTH EXTRACTION      There were no vitals filed for this visit.  Subjective Assessment - 01/23/20 1322    Subjective  No pain. I still  cannot walk a straight line.    Currently in Pain?  No/denies                       [facility] Adult PT Treatment/Exercise - 01/23/20 0001      Ankle Exercises: Standing   SLS  SLS with vectors intermittent touch only     Heel Raises  Both;20 reps   box shifts    Toe Raise  20 reps    Other Standing Ankle Exercises  alternating and unilateral step taps     Other Standing Ankle Exercises  tandem gait       Ankle Exercises: Aerobic   Elliptical  Ramp 1 Resistance 4 5 minutes    HR  138 first reading on elliptical      Ankle Exercises: Stretches   Slant Board Stretch  3 reps;30 seconds               PT Short Term Goals - 11/29/19 2017      PT SHORT TERM GOAL #1   Title  Independent with HEP    Baseline  updated today from home health HEP    Time  6    Period  Weeks    Status  New      PT SHORT TERM GOAL #2   Title  Independent community ambulation without further need for crutch use    Baseline  single crutch use    Time  6    Period  Weeks      PT SHORT TERM GOAL #3   Title  Increase left ankle DF AROM to at least neutral for improved toe clearance with gait    Baseline  -4 deg    Time  6    Period  Weeks    Status  New        PT Long Term Goals - 01/07/20 2006      PT LONG TERM GOAL #1   Title  Improve FOTO outcome measure score to 26% or less impairment    Baseline  41% limited at eval, current limitation 33%    Time  12    Period  Weeks    Status  On-going      PT LONG TERM GOAL #2   Title  Bilateral ankle strength 5/5 to improve ability for stair navigation at home and for ankle stability with outdoor ambulation over uneven surfaces    Baseline  see objective, goal ongoing    Time  12    Period  Weeks    Status  On-going      PT LONG TERM GOAL #3   Title  Tolerate community level ambulation with bilat. ankle pain <2/10 at worst    Baseline  pain improving but goal ongoing    Time  12    Period  Weeks    Status  On-going      PT LONG TERM GOAL #4   Title  Ascend/descend 15 steps to 2nd floor of home with reciprocal gait    Baseline  unable to descend with reciprocal gait    Time  12    Period  Weeks    Status  On-going            Plan - 01/23/20 1303    Clinical Impression Statement  Noted HR elevated quickly on elliptical (138-162 bpm) Continued with closed chain dynamic balance and HR taken again and was 154 bpm.  Automatic BP  taken and vitals162/102, HR 128, Viatls taken again Manually at 140/80, HR 116. She  was instructed to F/U with MD regarding her vital signs today.    PT Next Visit Plan  Monitor vitals ; check if she f/U with MD ; continue/progress closed chain activities and balance/proprioceptive challenges as tolerated, continue manual/foam roll as found beneficial    PT Home Exercise Plan  ankle Theraband 4-way, gastroc stretch longsitting with strap vs. standing if tolerated, standing hip 3-way SLR with Theraband, partial squat, Romberg on cushion, for core strengthening work pelvic tilts, supine marches and standing shoulder extension with Theraband. Toe yoga, towel scrunches.       Patient will benefit from skilled therapeutic intervention in order to improve the following deficits and impairments:  Pain, Impaired flexibility, Decreased strength, Decreased activity tolerance, Increased edema, Decreased range of motion, Difficulty walking, Hypomobility, Decreased mobility, Decreased balance, Impaired sensation  Visit Diagnosis: Pain in right ankle and joints of right foot  Pain in left ankle and joints of left foot  Localized edema  Stiffness of left ankle, not elsewhere classified  Stiffness of right ankle, not elsewhere classified  Difficulty in walking, not elsewhere classified  Muscle weakness (generalized)     Problem List Patient Active Problem List   Diagnosis Date Noted  . Type III open pilon fracture of left tibia 07/19/2019  . Acute stress disorder   . Nondisplaced fracture of posterior process of right talus 07/09/2019  . Open pilon fracture, left, type III, initial encounter 07/06/2019  . Trimalleolar fracture of ankle, closed, right, initial encounter 07/06/2019  . Fall from height of greater than 3 feet 07/06/2019  . Closed compression fracture of body of L1 vertebra ([place]) 07/06/2019  . Open ankle fracture 07/05/2019  . Hyperlipidemia 07/19/2018  . Atypical chest pain 07/19/2018  . GERD (gastroesophageal reflux disease) 10/28/2015  . Nonspecific abnormal  electrocardiogram (ECG) (EKG) 02/11/2015  . Fibroid uterus 01/17/2015  . Anemia, iron deficiency 12/20/2014    Dorene Ar, PTA 01/23/2020, 1:28 PM  [facility] [address] [place], [place], [address] Phone: [phone]   Fax:  [phone]  Name: Ana Freeman MRN: [id number] Date of Birth: September 22, 1971

## 2020-01-28 ENCOUNTER — Ambulatory Visit: Payer: BC Managed Care – PPO | Admitting: Physical Therapy

## 2020-01-30 ENCOUNTER — Other Ambulatory Visit: Payer: Self-pay

## 2020-01-30 ENCOUNTER — Encounter: Payer: Self-pay | Admitting: Physical Therapy

## 2020-01-30 ENCOUNTER — Ambulatory Visit: Payer: BC Managed Care – PPO | Admitting: Physical Therapy

## 2020-01-30 VITALS — BP 146/85 | HR 85

## 2020-01-30 DIAGNOSIS — M25671 Stiffness of right ankle, not elsewhere classified: Secondary | ICD-10-CM

## 2020-01-30 DIAGNOSIS — M25571 Pain in right ankle and joints of right foot: Secondary | ICD-10-CM

## 2020-01-30 DIAGNOSIS — R262 Difficulty in walking, not elsewhere classified: Secondary | ICD-10-CM

## 2020-01-30 DIAGNOSIS — M25672 Stiffness of left ankle, not elsewhere classified: Secondary | ICD-10-CM

## 2020-01-30 DIAGNOSIS — M25572 Pain in left ankle and joints of left foot: Secondary | ICD-10-CM

## 2020-01-30 DIAGNOSIS — M6281 Muscle weakness (generalized): Secondary | ICD-10-CM

## 2020-01-30 DIAGNOSIS — R600 Localized edema: Secondary | ICD-10-CM

## 2020-01-30 NOTE — Therapy (Signed)
[place], [place], [address] Phone: [phone]   Fax:  [phone]  Physical Therapy Treatment  Patient Details  Name: Ana Freeman MRN: [id number] Date of Birth: 1971/06/23 Referring Provider (PT): Katha Hamming, MD   Encounter Date: 01/30/2020  PT End of Session - 01/30/20 1207    Visit Number  13    Number of Visits  24    Date for PT Re-Evaluation  02/21/20    Authorization Type  BCBS    PT Start Time  1200   15 minutes late   PT Stop Time  1230    PT Time Calculation (min)  30 min       Past Medical History:  Diagnosis Date  . Anemia   . Brain lesion   . GERD (gastroesophageal reflux disease)   . Headache   . Hyperlipidemia 07/19/2018  . Numbness of lower extremity   . Parathyroid abnormality [facility])     Past Surgical History:  Procedure Laterality Date  . ANKLE CLOSED REDUCTION Right 07/05/2019   Procedure: Closed Reduction Ankle;  Surgeon: Hiram Gash, MD;  Location: [place];  Service: Orthopedics;  Laterality: Right;  . COLONOSCOPY    . DILATATION & CURETTAGE/HYSTEROSCOPY WITH MYOSURE N/A 10/24/2019   Procedure: DILATATION & CURETTAGE/HYSTEROSCOPY WITH MYOSURE;  Surgeon: Waymon Amato, MD;  Location: [place];  Service: Gynecology;  Laterality: N/A;  . DILATION AND EVACUATION N/A 02/12/2016   Procedure: DILATATION AND EVACUATION WITH SUCTION ;  Surgeon: Eldred Manges, MD;  Location: [place] [facility];  Service: Gynecology;  Laterality: N/A;  . EXTERNAL FIXATION LEG Left 07/05/2019   Procedure: EXTERNAL FIXATION LEFT ANKLE;  Surgeon: Hiram Gash, MD;  Location: [place];  Service: Orthopedics;  Laterality: Left;  . EXTERNAL FIXATION REMOVAL Left 07/19/2019   Procedure: REMOVAL EXTERNAL FIXATION LEG;  Surgeon: Shona Needles, MD;  Location: [place];  Service: Orthopedics;  Laterality: Left;  . HYSTEROSCOPY WITH RESECTOSCOPE N/A 02/12/2016   Procedure: HYSTEROSCOPIC MYOMECTOMY WITH  RESECTOSCOPE;  Surgeon: Eldred Manges, MD;  Location: [place] [facility];  Service: Gynecology;  Laterality: N/A;  . I & D EXTREMITY Left 07/05/2019   Procedure: IRRIGATION AND DEBRIDEMENT left ankle ;  Surgeon: Hiram Gash, MD;  Location: [place];  Service: Orthopedics;  Laterality: Left;  . I & D EXTREMITY Left 07/08/2019   Procedure: IRRIGATION AND DEBRIDEMENT EXTREMITY WITH ADJUSTMENT OF EXTERNAL FIXATOR;  Surgeon: Shona Needles, MD;  Location: [place];  Service: Orthopedics;  Laterality: Left;  Marland Kitchen MASS EXCISION Right 08/30/2017   Procedure: EXCISION RIGHT UPPER ARM MASS;  Surgeon: Coralie Keens, MD;  Location: [place];  Service: General;  Laterality: Right;  . MYOMECTOMY N/A 02/12/2016   Procedure: ABDOMINAL MYOMECTOMY;  Surgeon: Eldred Manges, MD;  Location: [facility] [place] [facility];  Service: Gynecology;  Laterality: N/A;  . OPEN REDUCTION INTERNAL FIXATION (ORIF) TIBIA/FIBULA FRACTURE Left 07/19/2019   Procedure: OPEN REDUCTION INTERNAL FIXATION (ORIF) LEFT PILON;  Surgeon: Shona Needles, MD;  Location: [place];  Service: Orthopedics;  Laterality: Left;  . ORIF ANKLE FRACTURE Right 07/08/2019   Procedure: OPEN REDUCTION INTERNAL FIXATION (ORIF) ANKLE FRACTURE;  Surgeon: Shona Needles, MD;  Location: [place];  Service: Orthopedics;  Laterality: Right;  . UPPER GI ENDOSCOPY    . WISDOM TOOTH EXTRACTION      Vitals:   01/30/20 1205  BP: (!) 146/85  Pulse: 85  [place] Adult PT Treatment/Exercise - 01/30/20 0001      Knee/Hip Exercises: Machines for Strengthening   Total Gym Leg Press  55# bilateral       Knee/Hip Exercises: Standing   Forward Step Up  Right;Left;20 reps;Hand Hold: 1;Step Height: 8"    Step Down  Right;Left;1 set;10 reps;Hand Hold: 0;Step Height: 4"    Other Standing Knee Exercises  Dead lift and squats 15# KB x 15 each       Knee/Hip Exercises: Supine   Bridges  10 reps    Bridges Limitations  narrow, arms crossed     Single Leg  Bridge  --   bridge with alternating knee extensions      Knee/Hip Exercises: Prone   Other Prone Exercises  Qped- bird dogs 5 sec holds       Ankle Exercises: Stretches   Slant Board Stretch  3 reps;30 seconds      Ankle Exercises: Standing   Other Standing Ankle Exercises  tandem gait                PT Short Term Goals - 11/29/19 2017      PT SHORT TERM GOAL #1   Title  Independent with HEP    Baseline  updated today from home health HEP    Time  6    Period  Weeks    Status  New      PT SHORT TERM GOAL #2   Title  Independent community ambulation without further need for crutch use    Baseline  single crutch use    Time  6    Period  Weeks      PT SHORT TERM GOAL #3   Title  Increase left ankle DF AROM to at least neutral for improved toe clearance with gait    Baseline  -4 deg    Time  6    Period  Weeks    Status  New        PT Long Term Goals - 01/07/20 2006      PT LONG TERM GOAL #1   Title  Improve FOTO outcome measure score to 26% or less impairment    Baseline  41% limited at eval, current limitation 33%    Time  12    Period  Weeks    Status  On-going      PT LONG TERM GOAL #2   Title  Bilateral ankle strength 5/5 to improve ability for stair navigation at home and for ankle stability with outdoor ambulation over uneven surfaces    Baseline  see objective, goal ongoing    Time  12    Period  Weeks    Status  On-going      PT LONG TERM GOAL #3   Title  Tolerate community level ambulation with bilat. ankle pain <2/10 at worst    Baseline  pain improving but goal ongoing    Time  12    Period  Weeks    Status  On-going      PT LONG TERM GOAL #4   Title  Ascend/descend 15 steps to 2nd floor of home with reciprocal gait    Baseline  unable to descend with reciprocal gait    Time  12    Period  Weeks    Status  On-going            Plan - 01/30/20 1302    Clinical Impression Statement  pt arrives  reporting no pain however  difficulty with balance exercises, in particular SLS and tendem stance. Incorporated quadruped and supine core strength with instability noted. Her BP and HR are in normal ranges today with normal response to therex. She was encouraged to follow up ith MD regarding previous high BP and HR.    PT Next Visit Plan  Monitor vitals ; check if she f/U with MD ; core and balance    PT Home Exercise Plan  ankle Theraband 4-way, gastroc stretch longsitting with strap vs. standing if tolerated, standing hip 3-way SLR with Theraband, partial squat, Romberg on cushion, for core strengthening work pelvic tilts, supine marches and standing shoulder extension with Theraband. Toe yoga, towel scrunches.       Patient will benefit from skilled therapeutic intervention in order to improve the following deficits and impairments:  Pain, Impaired flexibility, Decreased strength, Decreased activity tolerance, Increased edema, Decreased range of motion, Difficulty walking, Hypomobility, Decreased mobility, Decreased balance, Impaired sensation  Visit Diagnosis: Pain in right ankle and joints of right foot  Pain in left ankle and joints of left foot  Localized edema  Stiffness of left ankle, not elsewhere classified  Stiffness of right ankle, not elsewhere classified  Difficulty in walking, not elsewhere classified  Muscle weakness (generalized)     Problem List Patient Active Problem List   Diagnosis Date Noted  . Type III open pilon fracture of left tibia 07/19/2019  . Acute stress disorder   . Nondisplaced fracture of posterior process of right talus 07/09/2019  . Open pilon fracture, left, type III, initial encounter 07/06/2019  . Trimalleolar fracture of ankle, closed, right, initial encounter 07/06/2019  . Fall from height of greater than 3 feet 07/06/2019  . Closed compression fracture of body of L1 vertebra ([place]) 07/06/2019  . Open ankle fracture 07/05/2019  . Hyperlipidemia 07/19/2018  .  Atypical chest pain 07/19/2018  . GERD (gastroesophageal reflux disease) 10/28/2015  . Nonspecific abnormal electrocardiogram (ECG) (EKG) 02/11/2015  . Fibroid uterus 01/17/2015  . Anemia, iron deficiency 12/20/2014    Hessie Diener [facility]  01/30/2020, 1:07 PM  [facility] [address] [place], [place], [address] Phone: [phone]   Fax:  [phone]  Name: ASHARI MORREALE MRN: [id number] Date of Birth: 06-27-1971

## 2020-02-04 ENCOUNTER — Ambulatory Visit: Payer: BC Managed Care – PPO | Admitting: Physical Therapy

## 2020-02-06 ENCOUNTER — Ambulatory Visit: Payer: BC Managed Care – PPO | Admitting: Physical Therapy

## 2020-02-11 ENCOUNTER — Encounter: Payer: Self-pay | Admitting: Physical Therapy

## 2020-02-13 ENCOUNTER — Ambulatory Visit: Payer: BC Managed Care – PPO | Admitting: Physical Therapy

## 2020-02-14 ENCOUNTER — Ambulatory Visit: Payer: BC Managed Care – PPO | Attending: Student | Admitting: Physical Therapy

## 2020-02-14 ENCOUNTER — Encounter: Payer: Self-pay | Admitting: Physical Therapy

## 2020-02-14 ENCOUNTER — Other Ambulatory Visit: Payer: Self-pay

## 2020-02-14 VITALS — BP 131/85 | HR 78

## 2020-02-14 DIAGNOSIS — M25572 Pain in left ankle and joints of left foot: Secondary | ICD-10-CM | POA: Diagnosis not present

## 2020-02-14 DIAGNOSIS — M25571 Pain in right ankle and joints of right foot: Secondary | ICD-10-CM | POA: Diagnosis present

## 2020-02-14 DIAGNOSIS — R262 Difficulty in walking, not elsewhere classified: Secondary | ICD-10-CM | POA: Insufficient documentation

## 2020-02-14 DIAGNOSIS — M25671 Stiffness of right ankle, not elsewhere classified: Secondary | ICD-10-CM | POA: Diagnosis present

## 2020-02-14 DIAGNOSIS — M25672 Stiffness of left ankle, not elsewhere classified: Secondary | ICD-10-CM | POA: Diagnosis present

## 2020-02-14 DIAGNOSIS — M6281 Muscle weakness (generalized): Secondary | ICD-10-CM

## 2020-02-14 DIAGNOSIS — R600 Localized edema: Secondary | ICD-10-CM | POA: Diagnosis present

## 2020-02-14 NOTE — Therapy (Signed)
[place], [place], [address] Phone: [phone]   Fax:  [phone]  Physical Therapy Treatment  Patient Details  Name: Ana Freeman MRN: [id number] Date of Birth: 09-30-1971 Referring Provider (PT): Katha Hamming, MD   Encounter Date: 02/14/2020  PT End of Session - 02/14/20 1233    Visit Number  14    Number of Visits  24    Date for PT Re-Evaluation  02/21/20    Authorization Type  BCBS    PT Start Time  1232    PT Stop Time  1315    PT Time Calculation (min)  43 min       Past Medical History:  Diagnosis Date  . Anemia   . Brain lesion   . GERD (gastroesophageal reflux disease)   . Headache   . Hyperlipidemia 07/19/2018  . Numbness of lower extremity   . Parathyroid abnormality [facility])     Past Surgical History:  Procedure Laterality Date  . ANKLE CLOSED REDUCTION Right 07/05/2019   Procedure: Closed Reduction Ankle;  Surgeon: Hiram Gash, MD;  Location: [place];  Service: Orthopedics;  Laterality: Right;  . COLONOSCOPY    . DILATATION & CURETTAGE/HYSTEROSCOPY WITH MYOSURE N/A 10/24/2019   Procedure: DILATATION & CURETTAGE/HYSTEROSCOPY WITH MYOSURE;  Surgeon: Waymon Amato, MD;  Location: [facility];  Service: Gynecology;  Laterality: N/A;  . DILATION AND EVACUATION N/A 02/12/2016   Procedure: DILATATION AND EVACUATION WITH SUCTION ;  Surgeon: Eldred Manges, MD;  Location: [facility];  Service: Gynecology;  Laterality: N/A;  . EXTERNAL FIXATION LEG Left 07/05/2019   Procedure: EXTERNAL FIXATION LEFT ANKLE;  Surgeon: Hiram Gash, MD;  Location: [place];  Service: Orthopedics;  Laterality: Left;  . EXTERNAL FIXATION REMOVAL Left 07/19/2019   Procedure: REMOVAL EXTERNAL FIXATION LEG;  Surgeon: Shona Needles, MD;  Location: [place];  Service: Orthopedics;  Laterality: Left;  . HYSTEROSCOPY WITH RESECTOSCOPE N/A 02/12/2016   Procedure: HYSTEROSCOPIC MYOMECTOMY WITH RESECTOSCOPE;  Surgeon:  Eldred Manges, MD;  Location: [facility];  Service: Gynecology;  Laterality: N/A;  . I & D EXTREMITY Left 07/05/2019   Procedure: IRRIGATION AND DEBRIDEMENT left ankle ;  Surgeon: Hiram Gash, MD;  Location: [place];  Service: Orthopedics;  Laterality: Left;  . I & D EXTREMITY Left 07/08/2019   Procedure: IRRIGATION AND DEBRIDEMENT EXTREMITY WITH ADJUSTMENT OF EXTERNAL FIXATOR;  Surgeon: Shona Needles, MD;  Location: [place];  Service: Orthopedics;  Laterality: Left;  [place] Kitchen MASS EXCISION Right 08/30/2017   Procedure: EXCISION RIGHT UPPER ARM MASS;  Surgeon: Coralie Keens, MD;  Location: [place];  Service: General;  Laterality: Right;  . MYOMECTOMY N/A 02/12/2016   Procedure: ABDOMINAL MYOMECTOMY;  Surgeon: Eldred Manges, MD;  Location: [facility];  Service: Gynecology;  Laterality: N/A;  . OPEN REDUCTION INTERNAL FIXATION (ORIF) TIBIA/FIBULA FRACTURE Left 07/19/2019   Procedure: OPEN REDUCTION INTERNAL FIXATION (ORIF) LEFT PILON;  Surgeon: Shona Needles, MD;  Location: [place];  Service: Orthopedics;  Laterality: Left;  . ORIF ANKLE FRACTURE Right 07/08/2019   Procedure: OPEN REDUCTION INTERNAL FIXATION (ORIF) ANKLE FRACTURE;  Surgeon: Shona Needles, MD;  Location: [place];  Service: Orthopedics;  Laterality: Right;  . UPPER GI ENDOSCOPY    . WISDOM TOOTH EXTRACTION      Vitals:   02/14/20 1236  BP: 131/85  Pulse: 78  SpO2: 99%    Subjective Assessment - 02/14/20 1257  Subjective  I notice a little  shin pain on left. Some limping in morning.    Currently in Pain?  No/denies         [facility] PT Assessment - 02/14/20 0001      AROM   Right Ankle Dorsiflexion  5    Right Ankle Plantar Flexion  63    Right Ankle Inversion  45    Right Ankle Eversion  18    Left Ankle Dorsiflexion  3    Left Ankle Plantar Flexion  60    Left Ankle Inversion  30    Left Ankle Eversion  18      Strength   Right Ankle Dorsiflexion  5/5    Right Ankle Plantar Flexion  5/5   tested  supine   Right Ankle Inversion  5/5    Right Ankle Eversion  5/5    Left Ankle Dorsiflexion  5/5   tested supine   Left Ankle Plantar Flexion  5/5    Left Ankle Inversion  4+/5    Left Ankle Eversion  4+/5                   [facility] Adult PT Treatment/Exercise - 02/14/20 0001      Knee/Hip Exercises: Machines for Strengthening   Total Gym Leg Press  65# bilateral       Knee/Hip Exercises: Standing   Lateral Step Up  20 reps    Lateral Step Up Limitations  4 inch and 6 inch       Ankle Exercises: Standing   SLS  17 sec best bilateral     Heel Raises  20 reps   edge of step     Ankle Exercises: Stretches   Soleus Stretch  3 reps;30 seconds    Slant Board Stretch  3 reps;30 seconds      Ankle Exercises: Aerobic   Recumbent Bike  L2 x 5 minutes    HR 117 bpm, SPO2 99%               PT Short Term Goals - 02/14/20 1239      PT SHORT TERM GOAL #1   Title  Independent with HEP    Time  6    Period  Weeks    Status  Achieved      PT SHORT TERM GOAL #2   Title  Independent community ambulation without further need for crutch use    Time  6    Period  Weeks    Status  Achieved      PT SHORT TERM GOAL #3   Title  Increase left ankle DF AROM to at least neutral for improved toe clearance with gait    Time  6    Period  Weeks    Status  Achieved        PT Long Term Goals - 02/14/20 1326      PT LONG TERM GOAL #1   Title  Improve FOTO outcome measure score to 26% or less impairment    Baseline  41% limited at eval, current limitation 33%    Time  12    Period  Weeks    Status  On-going      PT LONG TERM GOAL #2   Title  Bilateral ankle strength 5/5 to improve ability for stair navigation at home and for ankle stability with outdoor ambulation over uneven surfaces    Baseline  see objective, goal partially met  Time  12    Period  Weeks    Status  Partially Met      PT LONG TERM GOAL #3   Title  Tolerate community level ambulation with  bilat. ankle pain <2/10 at worst    Time  12    Period  Weeks    Status  Achieved      PT LONG TERM GOAL #4   Title  Ascend/descend 15 steps to 2nd floor of home with reciprocal gait    Baseline  unable to descend with reciprocal gait    Time  12    Period  Weeks    Status  On-going            Plan - 02/14/20 1316    Clinical Impression Statement  Pt arrives after 2 week absence due to getting married. She reports morning stiffness in ankles that resolves after activity. She has been less compliant the last 2 weeks with HEP and notes an irritation at left shin. Her overall AROM and Strength have improved in both ankles. Her SLS has improved to 17 sec on both sides. She is independent with community ambulation without AD. All STGS met. She still has difficulty descending sairs due to Left ankle stiffness. Worked on stretching and stair training today with good tolerance. She is progressing toward remaining LTGs.    PT Next Visit Plan  Monitor vitals ; check if she f/U with MD(appt in April)  ; core and balance, soleus and gastroc stretching    PT Home Exercise Plan  ankle Theraband 4-way, gastroc stretch longsitting with strap vs. standing if tolerated, standing hip 3-way SLR with Theraband, partial squat, Romberg on cushion, for core strengthening work pelvic tilts, supine marches and standing shoulder extension with Theraband. Toe yoga, towel scrunches.       Patient will benefit from skilled therapeutic intervention in order to improve the following deficits and impairments:  Pain, Impaired flexibility, Decreased strength, Decreased activity tolerance, Increased edema, Decreased range of motion, Difficulty walking, Hypomobility, Decreased mobility, Decreased balance, Impaired sensation  Visit Diagnosis: Pain in left ankle and joints of left foot  Localized edema  Pain in right ankle and joints of right foot  Stiffness of left ankle, not elsewhere classified  Stiffness of right  ankle, not elsewhere classified  Difficulty in walking, not elsewhere classified  Muscle weakness (generalized)     Problem List Patient Active Problem List   Diagnosis Date Noted  . Type III open pilon fracture of left tibia 07/19/2019  . Acute stress disorder   . Nondisplaced fracture of posterior process of right talus 07/09/2019  . Open pilon fracture, left, type III, initial encounter 07/06/2019  . Trimalleolar fracture of ankle, closed, right, initial encounter 07/06/2019  . Fall from height of greater than 3 feet 07/06/2019  . Closed compression fracture of body of L1 vertebra ([place]) 07/06/2019  . Open ankle fracture 07/05/2019  . Hyperlipidemia 07/19/2018  . Atypical chest pain 07/19/2018  . GERD (gastroesophageal reflux disease) 10/28/2015  . Nonspecific abnormal electrocardiogram (ECG) (EKG) 02/11/2015  . Fibroid uterus 01/17/2015  . Anemia, iron deficiency 12/20/2014    Dorene Ar, PTA 02/14/2020, 1:28 PM  [facility] [address] [place], [place], [address] Phone: [phone]   Fax:  [phone]  Name: Ana Freeman MRN: [id number] Date of Birth: [birth year]

## 2020-02-18 ENCOUNTER — Encounter: Payer: Self-pay | Admitting: Physical Therapy

## 2020-02-18 ENCOUNTER — Ambulatory Visit: Payer: BC Managed Care – PPO | Admitting: Physical Therapy

## 2020-02-18 ENCOUNTER — Other Ambulatory Visit: Payer: Self-pay

## 2020-02-18 DIAGNOSIS — M25671 Stiffness of right ankle, not elsewhere classified: Secondary | ICD-10-CM

## 2020-02-18 DIAGNOSIS — M6281 Muscle weakness (generalized): Secondary | ICD-10-CM

## 2020-02-18 DIAGNOSIS — M25672 Stiffness of left ankle, not elsewhere classified: Secondary | ICD-10-CM

## 2020-02-18 DIAGNOSIS — R600 Localized edema: Secondary | ICD-10-CM

## 2020-02-18 DIAGNOSIS — M25572 Pain in left ankle and joints of left foot: Secondary | ICD-10-CM | POA: Diagnosis not present

## 2020-02-18 DIAGNOSIS — R262 Difficulty in walking, not elsewhere classified: Secondary | ICD-10-CM

## 2020-02-18 DIAGNOSIS — M25571 Pain in right ankle and joints of right foot: Secondary | ICD-10-CM

## 2020-02-18 NOTE — Therapy (Signed)
[place], [place], [address] Phone: [phone]   Fax:  [phone]  Physical Therapy Treatment  Patient Details  Name: Ana Freeman MRN: [id number] Date of Birth: [birth year] Referring Provider (PT): Katha Hamming, MD   Encounter Date: 02/18/2020  PT End of Session - 02/18/20 1325    Visit Number  15    Number of Visits  24    Date for PT Re-Evaluation  02/21/20    Authorization Type  BCBS    PT Start Time  1320    PT Stop Time  9470    PT Time Calculation (min)  38 min       Past Medical History:  Diagnosis Date  . Anemia   . Brain lesion   . GERD (gastroesophageal reflux disease)   . Headache   . Hyperlipidemia 07/19/2018  . Numbness of lower extremity   . Parathyroid abnormality [facility])     Past Surgical History:  Procedure Laterality Date  . ANKLE CLOSED REDUCTION Right 07/05/2019   Procedure: Closed Reduction Ankle;  Surgeon: Hiram Gash, MD;  Location: [place];  Service: Orthopedics;  Laterality: Right;  . COLONOSCOPY    . DILATATION & CURETTAGE/HYSTEROSCOPY WITH MYOSURE N/A 10/24/2019   Procedure: DILATATION & CURETTAGE/HYSTEROSCOPY WITH MYOSURE;  Surgeon: Waymon Amato, MD;  Location: [place];  Service: Gynecology;  Laterality: N/A;  . DILATION AND EVACUATION N/A 02/12/2016   Procedure: DILATATION AND EVACUATION WITH SUCTION ;  Surgeon: Eldred Manges, MD;  Location: [facility];  Service: Gynecology;  Laterality: N/A;  . EXTERNAL FIXATION LEG Left 07/05/2019   Procedure: EXTERNAL FIXATION LEFT ANKLE;  Surgeon: Hiram Gash, MD;  Location: [place];  Service: Orthopedics;  Laterality: Left;  . EXTERNAL FIXATION REMOVAL Left 07/19/2019   Procedure: REMOVAL EXTERNAL FIXATION LEG;  Surgeon: Shona Needles, MD;  Location: [place];  Service: Orthopedics;  Laterality: Left;  . HYSTEROSCOPY WITH RESECTOSCOPE N/A 02/12/2016   Procedure: HYSTEROSCOPIC MYOMECTOMY WITH RESECTOSCOPE;  Surgeon:  Eldred Manges, MD;  Location: [facility];  Service: Gynecology;  Laterality: N/A;  . I & D EXTREMITY Left 07/05/2019   Procedure: IRRIGATION AND DEBRIDEMENT left ankle ;  Surgeon: Hiram Gash, MD;  Location: [place];  Service: Orthopedics;  Laterality: Left;  . I & D EXTREMITY Left 07/08/2019   Procedure: IRRIGATION AND DEBRIDEMENT EXTREMITY WITH ADJUSTMENT OF EXTERNAL FIXATOR;  Surgeon: Shona Needles, MD;  Location: [place];  Service: Orthopedics;  Laterality: Left;  Mar[place] Kitchen MASS EXCISION Right 08/30/2017   Procedure: EXCISION RIGHT UPPER ARM MASS;  Surgeon: Coralie Keens, MD;  Location: [place];  Service: General;  Laterality: Right;  . MYOMECTOMY N/A 02/12/2016   Procedure: ABDOMINAL MYOMECTOMY;  Surgeon: Eldred Manges, MD;  Location: [facility];  Service: Gynecology;  Laterality: N/A;  . OPEN REDUCTION INTERNAL FIXATION (ORIF) TIBIA/FIBULA FRACTURE Left 07/19/2019   Procedure: OPEN REDUCTION INTERNAL FIXATION (ORIF) LEFT PILON;  Surgeon: Shona Needles, MD;  Location: [place];  Service: Orthopedics;  Laterality: Left;  . ORIF ANKLE FRACTURE Right 07/08/2019   Procedure: OPEN REDUCTION INTERNAL FIXATION (ORIF) ANKLE FRACTURE;  Surgeon: Shona Needles, MD;  Location: [place];  Service: Orthopedics;  Laterality: Right;  . UPPER GI ENDOSCOPY    . WISDOM TOOTH EXTRACTION      There were no vitals filed for this visit.  [facility] Adult PT Treatment/Exercise - 02/18/20 0001      Knee/Hip Exercises: Standing   Lateral Step Up  20 reps    Lateral Step Up Limitations  6 inch     Step Down  Right;Left;1 set;10 reps;Hand Hold: 0;Step Height: 4"    Other Standing Knee Exercises  BOSU step up x 15 each on round side      Ankle Exercises: Standing   SLS  20 sec right, 25 sec left     Heel Raises  20 reps   edge of step   Other Standing Ankle Exercises  on airex paloff press and staggered rotation pulls green band     Other Standing Ankle Exercises  tandem  gait       Ankle Exercises: Stretches   Soleus Stretch  3 reps;30 seconds    Slant Board Stretch  3 reps;30 seconds      Ankle Exercises: Aerobic   Recumbent Bike  L2 x 5 minutes                PT Short Term Goals - 02/14/20 1239      PT SHORT TERM GOAL #1   Title  Independent with HEP    Time  6    Period  Weeks    Status  Achieved      PT SHORT TERM GOAL #2   Title  Independent community ambulation without further need for crutch use    Time  6    Period  Weeks    Status  Achieved      PT SHORT TERM GOAL #3   Title  Increase left ankle DF AROM to at least neutral for improved toe clearance with gait    Time  6    Period  Weeks    Status  Achieved        PT Long Term Goals - 02/14/20 1326      PT LONG TERM GOAL #1   Title  Improve FOTO outcome measure score to 26% or less impairment    Baseline  41% limited at eval, current limitation 33%    Time  12    Period  Weeks    Status  On-going      PT LONG TERM GOAL #2   Title  Bilateral ankle strength 5/5 to improve ability for stair navigation at home and for ankle stability with outdoor ambulation over uneven surfaces    Baseline  see objective, goal partially met    Time  12    Period  Weeks    Status  Partially Met      PT LONG TERM GOAL #3   Title  Tolerate community level ambulation with bilat. ankle pain <2/10 at worst    Time  12    Period  Weeks    Status  Achieved      PT LONG TERM GOAL #4   Title  Ascend/descend 15 steps to 2nd floor of home with reciprocal gait    Baseline  unable to descend with reciprocal gait    Time  12    Period  Weeks    Status  On-going            Plan - 02/18/20 1412    Clinical Impression Statement  Pt reports compliance with gastroc and soleus stretches as well as balance exercises. Her SLS has improved to 20, 25 sec. Her tandem gait requires less assist from UE. Descending stairs is still difficult.  PT Next Visit Plan  discharge, check goals, FOTO        Patient will benefit from skilled therapeutic intervention in order to improve the following deficits and impairments:     Visit Diagnosis: Pain in left ankle and joints of left foot  Localized edema  Pain in right ankle and joints of right foot  Stiffness of left ankle, not elsewhere classified  Stiffness of right ankle, not elsewhere classified  Muscle weakness (generalized)  Difficulty in walking, not elsewhere classified     Problem List Patient Active Problem List   Diagnosis Date Noted  . Type III open pilon fracture of left tibia 07/19/2019  . Acute stress disorder   . Nondisplaced fracture of posterior process of right talus 07/09/2019  . Open pilon fracture, left, type III, initial encounter 07/06/2019  . Trimalleolar fracture of ankle, closed, right, initial encounter 07/06/2019  . Fall from height of greater than 3 feet 07/06/2019  . Closed compression fracture of body of L1 vertebra ([place]) 07/06/2019  . Open ankle fracture 07/05/2019  . Hyperlipidemia 07/19/2018  . Atypical chest pain 07/19/2018  . GERD (gastroesophageal reflux disease) 10/28/2015  . Nonspecific abnormal electrocardiogram (ECG) (EKG) 02/11/2015  . Fibroid uterus 01/17/2015  . Anemia, iron deficiency 12/20/2014    Dorene Ar, PTA 02/18/2020, 2:14 PM  [facility] [address] [place], [place], [address] Phone: [phone]   Fax:  [phone]  Name: TERRISHA LOPATA MRN: [id number] Date of Birth: 1971-03-19

## 2020-02-20 ENCOUNTER — Other Ambulatory Visit: Payer: Self-pay

## 2020-02-20 ENCOUNTER — Ambulatory Visit: Payer: BC Managed Care – PPO | Admitting: Physical Therapy

## 2020-02-20 DIAGNOSIS — M6281 Muscle weakness (generalized): Secondary | ICD-10-CM

## 2020-02-20 DIAGNOSIS — M25571 Pain in right ankle and joints of right foot: Secondary | ICD-10-CM

## 2020-02-20 DIAGNOSIS — M25671 Stiffness of right ankle, not elsewhere classified: Secondary | ICD-10-CM

## 2020-02-20 DIAGNOSIS — M25572 Pain in left ankle and joints of left foot: Secondary | ICD-10-CM

## 2020-02-20 DIAGNOSIS — R262 Difficulty in walking, not elsewhere classified: Secondary | ICD-10-CM

## 2020-02-20 DIAGNOSIS — M25672 Stiffness of left ankle, not elsewhere classified: Secondary | ICD-10-CM

## 2020-02-20 DIAGNOSIS — R600 Localized edema: Secondary | ICD-10-CM

## 2020-02-20 NOTE — Therapy (Signed)
[place], [place], [address] Phone: [phone]   Fax:  [phone]  Physical Therapy Treatment  Patient Details  Name: Ana Freeman MRN: [id number] Date of Birth: [birth year] Referring Provider (PT): Katha Hamming, MD   Encounter Date: 02/20/2020  PT End of Session - 02/20/20 1349    Visit Number  16    Number of Visits  24    Date for PT Re-Evaluation  02/21/20    Authorization Type  BCBS    PT Start Time  1319    PT Stop Time  1357    PT Time Calculation (min)  38 min       Past Medical History:  Diagnosis Date  . Anemia   . Brain lesion   . GERD (gastroesophageal reflux disease)   . Headache   . Hyperlipidemia 07/19/2018  . Numbness of lower extremity   . Parathyroid abnormality [facility])     Past Surgical History:  Procedure Laterality Date  . ANKLE CLOSED REDUCTION Right 07/05/2019   Procedure: Closed Reduction Ankle;  Surgeon: Hiram Gash, MD;  Location: [place];  Service: Orthopedics;  Laterality: Right;  . COLONOSCOPY    . DILATATION & CURETTAGE/HYSTEROSCOPY WITH MYOSURE N/A 10/24/2019   Procedure: DILATATION & CURETTAGE/HYSTEROSCOPY WITH MYOSURE;  Surgeon: Waymon Amato, MD;  Location: [place];  Service: Gynecology;  Laterality: N/A;  . DILATION AND EVACUATION N/A 02/12/2016   Procedure: DILATATION AND EVACUATION WITH SUCTION ;  Surgeon: Eldred Manges, MD;  Location: [facility];  Service: Gynecology;  Laterality: N/A;  . EXTERNAL FIXATION LEG Left 07/05/2019   Procedure: EXTERNAL FIXATION LEFT ANKLE;  Surgeon: Hiram Gash, MD;  Location: [place];  Service: Orthopedics;  Laterality: Left;  . EXTERNAL FIXATION REMOVAL Left 07/19/2019   Procedure: REMOVAL EXTERNAL FIXATION LEG;  Surgeon: Shona Needles, MD;  Location: [place];  Service: Orthopedics;  Laterality: Left;  . HYSTEROSCOPY WITH RESECTOSCOPE N/A 02/12/2016   Procedure: HYSTEROSCOPIC MYOMECTOMY WITH RESECTOSCOPE;  Surgeon:  Eldred Manges, MD;  Location: [facility] [facility];  Service: Gynecology;  Laterality: N/A;  . I & D EXTREMITY Left 07/05/2019   Procedure: IRRIGATION AND DEBRIDEMENT left ankle ;  Surgeon: Hiram Gash, MD;  Location: [place];  Service: Orthopedics;  Laterality: Left;  . I & D EXTREMITY Left 07/08/2019   Procedure: IRRIGATION AND DEBRIDEMENT EXTREMITY WITH ADJUSTMENT OF EXTERNAL FIXATOR;  Surgeon: Shona Needles, MD;  Location: [place];  Service: Orthopedics;  Laterality: Left;  Mar[place] Kitchen MASS EXCISION Right 08/30/2017   Procedure: EXCISION RIGHT UPPER ARM MASS;  Surgeon: Coralie Keens, MD;  Location: [place];  Service: General;  Laterality: Right;  . MYOMECTOMY N/A 02/12/2016   Procedure: ABDOMINAL MYOMECTOMY;  Surgeon: Eldred Manges, MD;  Location: [facility];  Service: Gynecology;  Laterality: N/A;  . OPEN REDUCTION INTERNAL FIXATION (ORIF) TIBIA/FIBULA FRACTURE Left 07/19/2019   Procedure: OPEN REDUCTION INTERNAL FIXATION (ORIF) LEFT PILON;  Surgeon: Shona Needles, MD;  Location: [place];  Service: Orthopedics;  Laterality: Left;  . ORIF ANKLE FRACTURE Right 07/08/2019   Procedure: OPEN REDUCTION INTERNAL FIXATION (ORIF) ANKLE FRACTURE;  Surgeon: Shona Needles, MD;  Location: [place];  Service: Orthopedics;  Laterality: Right;  . UPPER GI ENDOSCOPY    . WISDOM TOOTH EXTRACTION      There were no vitals filed for this visit.      [facility] PT Assessment - 02/20/20 0001  Observation/Other Assessments   Focus on Therapeutic Outcomes (FOTO)   33% limited       AROM   Right Ankle Dorsiflexion  5    Right Ankle Plantar Flexion  63    Right Ankle Inversion  45    Right Ankle Eversion  18    Left Ankle Dorsiflexion  3    Left Ankle Plantar Flexion  60    Left Ankle Inversion  30    Left Ankle Eversion  18                   [facility] Adult PT Treatment/Exercise - 02/20/20 0001      Knee/Hip Exercises: Standing   Heel Raises  20 reps    Lateral Step Up  20 reps    Lateral Step  Up Limitations  6 inch       Manual Therapy   Soft tissue mobilization  Tennis ball and massage roller STW to shin/anterior tib. Noted trigger points, relieved with pressure.       Ankle Exercises: Aerobic   Elliptical  L1 x 6 min      Ankle Exercises: Stretches   Soleus Stretch  3 reps;30 seconds    Slant Board Stretch  3 reps;30 seconds      Ankle Exercises: Standing   SLS  20 sec right, 20 sec left     Other Standing Ankle Exercises  tandem gait                PT Short Term Goals - 02/14/20 1239      PT SHORT TERM GOAL #1   Title  Independent with HEP    Time  6    Period  Weeks    Status  Achieved      PT SHORT TERM GOAL #2   Title  Independent community ambulation without further need for crutch use    Time  6    Period  Weeks    Status  Achieved      PT SHORT TERM GOAL #3   Title  Increase left ankle DF AROM to at least neutral for improved toe clearance with gait    Time  6    Period  Weeks    Status  Achieved        PT Long Term Goals - 02/20/20 1411      PT LONG TERM GOAL #1   Title  Improve FOTO outcome measure score to 26% or less impairment    Baseline  41% limited at eval, current limitation 33%    Time  12    Period  Weeks    Status  Not Met      PT LONG TERM GOAL #2   Title  Bilateral ankle strength 5/5 to improve ability for stair navigation at home and for ankle stability with outdoor ambulation over uneven surfaces    Baseline  see objective, goal partially met    Time  12    Period  Weeks    Status  Partially Met      PT LONG TERM GOAL #3   Title  Tolerate community level ambulation with bilat. ankle pain <2/10 at worst    Time  12    Status  Achieved      PT LONG TERM GOAL #4   Title  Ascend/descend 15 steps to 2nd floor of home with reciprocal gait    Baseline  needs bilat UE to descend reciprocally  Time  12    Period  Weeks    Status  Partially Met            Plan - 02/20/20 1410    Clinical Impression  Statement  FOTO score unchanged since last status 33% limited. Most goals partially met. Needs bilat UE to descend stairs reciprocally. Sees MD April 6th for F/U. Will continue HEP and agreeable to discharge today.She is having some increased left shin pain and was shown how to use tennis ball and massage roller to relieve tension in muscle. Recommended she report this pain to MD on F/U.    PT Next Visit Plan  discharge to HEP    PT Home Exercise Plan  ankle Theraband 4-way, gastroc stretch longsitting with strap vs. standing if tolerated, standing hip 3-way SLR with Theraband, partial squat, Romberg on cushion, for core strengthening work pelvic tilts, supine marches and standing shoulder extension with Theraband. Toe yoga, towel scrunches.    Consulted and Agree with Plan of Care  Patient       Patient will benefit from skilled therapeutic intervention in order to improve the following deficits and impairments:  Pain, Impaired flexibility, Decreased strength, Decreased activity tolerance, Increased edema, Decreased range of motion, Difficulty walking, Hypomobility, Decreased mobility, Decreased balance, Impaired sensation  Visit Diagnosis: Pain in left ankle and joints of left foot  Localized edema  Pain in right ankle and joints of right foot  Stiffness of left ankle, not elsewhere classified  Stiffness of right ankle, not elsewhere classified  Muscle weakness (generalized)  Difficulty in walking, not elsewhere classified     Problem List Patient Active Problem List   Diagnosis Date Noted  . Type III open pilon fracture of left tibia 07/19/2019  . Acute stress disorder   . Nondisplaced fracture of posterior process of right talus 07/09/2019  . Open pilon fracture, left, type III, initial encounter 07/06/2019  . Trimalleolar fracture of ankle, closed, right, initial encounter 07/06/2019  . Fall from height of greater than 3 feet 07/06/2019  . Closed compression fracture of body  of L1 vertebra ([place]) 07/06/2019  . Open ankle fracture 07/05/2019  . Hyperlipidemia 07/19/2018  . Atypical chest pain 07/19/2018  . GERD (gastroesophageal reflux disease) 10/28/2015  . Nonspecific abnormal electrocardiogram (ECG) (EKG) 02/11/2015  . Fibroid uterus 01/17/2015  . Anemia, iron deficiency 12/20/2014    Dorene Ar , PTA 02/20/2020, 2:14 PM  [facility] [address] [place], [place], [address] Phone: [phone]   Fax:  [phone]  Name: Ana Freeman MRN: [id number] Date of Birth: 1971/05/12

## 2020-02-25 NOTE — Therapy (Signed)
[place], [place], [address] Phone: [phone]   Fax:  [phone]  Physical Therapy Treatment/Discharge  Patient Details  Name: Ana Freeman MRN: [id number] Date of Birth: 1971/12/07 Referring Provider (PT): Katha Hamming, MD   Encounter Date: 02/20/2020    Past Medical History:  Diagnosis Date  . Anemia   . Brain lesion   . GERD (gastroesophageal reflux disease)   . Headache   . Hyperlipidemia 07/19/2018  . Numbness of lower extremity   . Parathyroid abnormality [facility])     Past Surgical History:  Procedure Laterality Date  . ANKLE CLOSED REDUCTION Right 07/05/2019   Procedure: Closed Reduction Ankle;  Surgeon: Hiram Gash, MD;  Location: [facility];  Service: Orthopedics;  Laterality: Right;  . COLONOSCOPY    . DILATATION & CURETTAGE/HYSTEROSCOPY WITH MYOSURE N/A 10/24/2019   Procedure: DILATATION & CURETTAGE/HYSTEROSCOPY WITH MYOSURE;  Surgeon: Waymon Amato, MD;  Location: [facility];  Service: Gynecology;  Laterality: N/A;  . DILATION AND EVACUATION N/A 02/12/2016   Procedure: DILATATION AND EVACUATION WITH SUCTION ;  Surgeon: Eldred Manges, MD;  Location: [facility];  Service: Gynecology;  Laterality: N/A;  . EXTERNAL FIXATION LEG Left 07/05/2019   Procedure: EXTERNAL FIXATION LEFT ANKLE;  Surgeon: Hiram Gash, MD;  Location: [facility];  Service: Orthopedics;  Laterality: Left;  . EXTERNAL FIXATION REMOVAL Left 07/19/2019   Procedure: REMOVAL EXTERNAL FIXATION LEG;  Surgeon: Shona Needles, MD;  Location: [facility];  Service: Orthopedics;  Laterality: Left;  . HYSTEROSCOPY WITH RESECTOSCOPE N/A 02/12/2016   Procedure: HYSTEROSCOPIC MYOMECTOMY WITH RESECTOSCOPE;  Surgeon: Eldred Manges, MD;  Location: [facility];  Service: Gynecology;  Laterality: N/A;  . I & D EXTREMITY Left 07/05/2019   Procedure: IRRIGATION AND DEBRIDEMENT left ankle ;  Surgeon: Hiram Gash, MD;  Location: [facility];  Service:  Orthopedics;  Laterality: Left;  . I & D EXTREMITY Left 07/08/2019   Procedure: IRRIGATION AND DEBRIDEMENT EXTREMITY WITH ADJUSTMENT OF EXTERNAL FIXATOR;  Surgeon: Shona Needles, MD;  Location: [facility];  Service: Orthopedics;  Laterality: Left;  Marland Kitchen MASS EXCISION Right 08/30/2017   Procedure: EXCISION RIGHT UPPER ARM MASS;  Surgeon: Coralie Keens, MD;  Location: [facility];  Service: General;  Laterality: Right;  . MYOMECTOMY N/A 02/12/2016   Procedure: ABDOMINAL MYOMECTOMY;  Surgeon: Eldred Manges, MD;  Location: [facility];  Service: Gynecology;  Laterality: N/A;  . OPEN REDUCTION INTERNAL FIXATION (ORIF) TIBIA/FIBULA FRACTURE Left 07/19/2019   Procedure: OPEN REDUCTION INTERNAL FIXATION (ORIF) LEFT PILON;  Surgeon: Shona Needles, MD;  Location: [facility];  Service: Orthopedics;  Laterality: Left;  . ORIF ANKLE FRACTURE Right 07/08/2019   Procedure: OPEN REDUCTION INTERNAL FIXATION (ORIF) ANKLE FRACTURE;  Surgeon: Shona Needles, MD;  Location: [facility];  Service: Orthopedics;  Laterality: Right;  . UPPER GI ENDOSCOPY    . WISDOM TOOTH EXTRACTION      There were no vitals filed for this visit.                              PT Short Term Goals - 02/14/20 1239      PT SHORT TERM GOAL #1   Title  Independent with HEP    Time  6    Period  Weeks    Status  Achieved      PT SHORT TERM GOAL #2  Title  Independent community ambulation without further need for crutch use    Time  6    Period  Weeks    Status  Achieved      PT SHORT TERM GOAL #3   Title  Increase left ankle DF AROM to at least neutral for improved toe clearance with gait    Time  6    Period  Weeks    Status  Achieved        PT Long Term Goals - 02/20/20 1411      PT LONG TERM GOAL #1   Title  Improve FOTO outcome measure score to 26% or less impairment    Baseline  41% limited at eval, current limitation 33%    Time  12    Period  Weeks    Status  Not Met      PT LONG TERM  GOAL #2   Title  Bilateral ankle strength 5/5 to improve ability for stair navigation at home and for ankle stability with outdoor ambulation over uneven surfaces    Baseline  see objective, goal partially met    Time  12    Period  Weeks    Status  Partially Met      PT LONG TERM GOAL #3   Title  Tolerate community level ambulation with bilat. ankle pain <2/10 at worst    Time  12    Status  Achieved      PT LONG TERM GOAL #4   Title  Ascend/descend 15 steps to 2nd floor of home with reciprocal gait    Baseline  needs bilat UE to descend reciprocally    Time  12    Period  Weeks    Status  Partially Met              Patient will benefit from skilled therapeutic intervention in order to improve the following deficits and impairments:  Pain, Impaired flexibility, Decreased strength, Decreased activity tolerance, Increased edema, Decreased range of motion, Difficulty walking, Hypomobility, Decreased mobility, Decreased balance, Impaired sensation  Visit Diagnosis: Pain in left ankle and joints of left foot  Localized edema  Pain in right ankle and joints of right foot  Stiffness of left ankle, not elsewhere classified  Stiffness of right ankle, not elsewhere classified  Muscle weakness (generalized)  Difficulty in walking, not elsewhere classified     Problem List Patient Active Problem List   Diagnosis Date Noted  . Type III open pilon fracture of left tibia 07/19/2019  . Acute stress disorder   . Nondisplaced fracture of posterior process of right talus 07/09/2019  . Open pilon fracture, left, type III, initial encounter 07/06/2019  . Trimalleolar fracture of ankle, closed, right, initial encounter 07/06/2019  . Fall from height of greater than 3 feet 07/06/2019  . Closed compression fracture of body of L1 vertebra ([place]) 07/06/2019  . Open ankle fracture 07/05/2019  . Hyperlipidemia 07/19/2018  . Atypical chest pain 07/19/2018  . GERD (gastroesophageal  reflux disease) 10/28/2015  . Nonspecific abnormal electrocardiogram (ECG) (EKG) 02/11/2015  . Fibroid uterus 01/17/2015  . Anemia, iron deficiency 12/20/2014        PHYSICAL THERAPY DISCHARGE SUMMARY  Visits from Start of Care: 16  Current functional level related to goals / functional outcomes: See above-therapy goals partially met. Patient will continue progress independently with HEP.   Remaining deficits: Ankle weakness   Education / Equipment: HEP, plan of care Plan: Patient agrees to discharge.  Patient goals were partially met. Patient is being discharged due to meeting the stated rehab goals.  ?????           Beaulah Dinning, PT, DPT 02/25/20 11:17 AM      [facility] [address] [place], [place], [address] Phone: [phone]   Fax:  [phone]  Name: Ana Freeman MRN: [id number] Date of Birth: 1971/02/19

## 2020-04-30 LAB — RESULTS CONSOLE HPV: CHL HPV: NEGATIVE

## 2020-04-30 LAB — HM PAP SMEAR: HM Pap smear: NEGATIVE

## 2020-05-19 ENCOUNTER — Other Ambulatory Visit: Payer: Self-pay | Admitting: Obstetrics and Gynecology

## 2020-05-19 DIAGNOSIS — E049 Nontoxic goiter, unspecified: Secondary | ICD-10-CM

## 2020-05-28 ENCOUNTER — Ambulatory Visit
Admission: RE | Admit: 2020-05-28 | Discharge: 2020-05-28 | Disposition: A | Discharge status: Home / Self Care | Payer: BC Managed Care – PPO | Source: Ambulatory Visit | Attending: Obstetrics and Gynecology | Admitting: Obstetrics and Gynecology

## 2020-05-28 DIAGNOSIS — E049 Nontoxic goiter, unspecified: Secondary | ICD-10-CM

## 2020-06-13 ENCOUNTER — Other Ambulatory Visit: Payer: Self-pay | Admitting: Obstetrics and Gynecology

## 2020-06-26 ENCOUNTER — Other Ambulatory Visit (HOSPITAL_COMMUNITY): Payer: Self-pay | Admitting: Otolaryngology

## 2020-06-26 DIAGNOSIS — E042 Nontoxic multinodular goiter: Secondary | ICD-10-CM

## 2020-06-27 ENCOUNTER — Other Ambulatory Visit: Payer: Self-pay | Admitting: Otolaryngology

## 2020-06-27 DIAGNOSIS — E042 Nontoxic multinodular goiter: Secondary | ICD-10-CM

## 2020-07-09 ENCOUNTER — Other Ambulatory Visit (HOSPITAL_COMMUNITY): Payer: BC Managed Care – PPO

## 2020-07-23 ENCOUNTER — Ambulatory Visit
Admission: RE | Admit: 2020-07-23 | Discharge: 2020-07-23 | Disposition: A | Discharge status: Home / Self Care | Payer: BC Managed Care – PPO | Source: Ambulatory Visit | Attending: Otolaryngology | Admitting: Otolaryngology

## 2020-07-23 ENCOUNTER — Other Ambulatory Visit (HOSPITAL_COMMUNITY)
Admission: RE | Admit: 2020-07-23 | Discharge: 2020-07-23 | Disposition: A | Discharge status: Home / Self Care | Payer: BC Managed Care – PPO | Source: Ambulatory Visit | Attending: Otolaryngology | Admitting: Otolaryngology

## 2020-07-23 DIAGNOSIS — E041 Nontoxic single thyroid nodule: Secondary | ICD-10-CM | POA: Diagnosis not present

## 2020-07-23 DIAGNOSIS — E042 Nontoxic multinodular goiter: Secondary | ICD-10-CM

## 2020-07-25 LAB — CYTOLOGY - NON PAP

## 2020-09-01 ENCOUNTER — Ambulatory Visit: Payer: BC Managed Care – PPO | Admitting: Family Medicine

## 2020-09-16 ENCOUNTER — Other Ambulatory Visit: Payer: Self-pay | Admitting: Student

## 2020-09-16 DIAGNOSIS — M25472 Effusion, left ankle: Secondary | ICD-10-CM

## 2020-10-05 ENCOUNTER — Ambulatory Visit
Admission: RE | Admit: 2020-10-05 | Discharge: 2020-10-05 | Disposition: A | Discharge status: Home / Self Care | Payer: BC Managed Care – PPO | Source: Ambulatory Visit | Attending: Student | Admitting: Student

## 2020-10-05 ENCOUNTER — Other Ambulatory Visit: Payer: Self-pay

## 2020-10-05 DIAGNOSIS — M25472 Effusion, left ankle: Secondary | ICD-10-CM

## 2020-10-16 ENCOUNTER — Ambulatory Visit: Payer: BC Managed Care – PPO | Attending: Internal Medicine

## 2020-10-16 DIAGNOSIS — Z23 Encounter for immunization: Secondary | ICD-10-CM

## 2020-10-16 NOTE — Progress Notes (Signed)
   Covid-19 Vaccination Clinic  Name:  Ana Freeman    MRN: [id number] DOB: 03/23/1971  10/16/2020  [name] was observed post Covid-19 immunization for 15 minutes without incident. She was provided with Vaccine Information Sheet and instruction to access the V-Safe system.   [name] was instructed to call 911 with any severe reactions post vaccine: Marland Kitchen Difficulty breathing  . Swelling of face and throat  . A fast heartbeat  . A bad rash all over body  . Dizziness and weakness

## 2020-10-17 ENCOUNTER — Ambulatory Visit: Payer: Self-pay

## 2020-10-21 ENCOUNTER — Other Ambulatory Visit: Payer: Self-pay | Admitting: Obstetrics and Gynecology

## 2020-10-28 ENCOUNTER — Ambulatory Visit: Payer: BC Managed Care – PPO | Admitting: Family Medicine

## 2020-10-28 ENCOUNTER — Encounter: Payer: Self-pay | Admitting: Family Medicine

## 2020-10-28 ENCOUNTER — Other Ambulatory Visit: Payer: Self-pay

## 2020-10-28 VITALS — BP 118/68 | HR 80 | Temp 96.9°F | Ht 65.0 in | Wt 185.0 lb

## 2020-10-28 DIAGNOSIS — Z1159 Encounter for screening for other viral diseases: Secondary | ICD-10-CM

## 2020-10-28 DIAGNOSIS — E663 Overweight: Secondary | ICD-10-CM | POA: Insufficient documentation

## 2020-10-28 DIAGNOSIS — L659 Nonscarring hair loss, unspecified: Secondary | ICD-10-CM

## 2020-10-28 DIAGNOSIS — Z13220 Encounter for screening for lipoid disorders: Secondary | ICD-10-CM | POA: Diagnosis not present

## 2020-10-28 DIAGNOSIS — B009 Herpesviral infection, unspecified: Secondary | ICD-10-CM | POA: Insufficient documentation

## 2020-10-28 DIAGNOSIS — R202 Paresthesia of skin: Secondary | ICD-10-CM | POA: Insufficient documentation

## 2020-10-28 DIAGNOSIS — E669 Obesity, unspecified: Secondary | ICD-10-CM

## 2020-10-28 DIAGNOSIS — R200 Anesthesia of skin: Secondary | ICD-10-CM | POA: Insufficient documentation

## 2020-10-28 DIAGNOSIS — M25472 Effusion, left ankle: Secondary | ICD-10-CM | POA: Insufficient documentation

## 2020-10-28 NOTE — Assessment & Plan Note (Signed)
Due to prior ankle fracture and repair. Is working with ortho. Start compression socks daily to see if that improves. Appreciate ortho support.

## 2020-10-28 NOTE — Assessment & Plan Note (Signed)
Suppression therapy working. Cont Valacyclovir daily

## 2020-10-28 NOTE — Assessment & Plan Note (Signed)
Etiology unclear. Does not seem to have neck symptoms. Wonder about muscle and fascial related small nerve compression. Advised trial of PT to see if that could improve or localize cause. She will call back when she has time to go.

## 2020-10-28 NOTE — Patient Instructions (Signed)
Ankle swelling - agree with compression socks   Left arm numbness - mychart or call if you want a referral to physical therapy  Alopecia - dermatology referral  #Referral I have placed a referral to a specialist for you. You should receive a phone call from the specialty office. Make sure your voicemail is not full and that if you are able to answer your phone to unknown or new numbers.   It may take up to 2 weeks to hear about the referral. If you do not hear anything in 2 weeks, please call our office and ask to speak with the referral coordinator.

## 2020-10-28 NOTE — Assessment & Plan Note (Signed)
Dx by dermatologist with biopsy and was benefiting from injections, but needs a new dermatologist for continued care.

## 2020-10-28 NOTE — Progress Notes (Signed)
Subjective:     Ana Freeman is a 49 y.o. 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  - ekg a few years ago was fine - numbness occurs every other day - no treatment - will last 30 minutes - does not limit her activity - starts at shoulder - has a "hump" in the back of her neck, no pain   #Alopecia - noticed it 10 years ago - got a biopsy from a dermatologist in [place] for diagnosis  - was going to a dermatologist in [place] - was getting shots to help   Review of Systems   Social History   Tobacco Use  Smoking Status Never Smoker  Smokeless Tobacco Never Used        Objective:    BP Readings from Last 3 Encounters:  10/28/20 118/68  02/14/20 131/85  01/30/20 (!) 146/85   Wt Readings from Last 3 Encounters:  10/28/20 185 lb (83.9 kg)  10/30/19 172 lb (78 kg)  10/24/19 172 lb 6.4 oz (78.2 kg)    BP 118/68   Pulse 80   Temp (!) 96.9 F (36.1 C) (Temporal)   Ht 5\' 5"  (1.651 m)   Wt 185 lb (83.9 kg)   SpO2 100%   BMI 30.79 kg/m    Physical Exam Constitutional:      General: She is not in acute distress.    Appearance: She is well-developed. She is not diaphoretic.  HENT:     Right Ear: External ear normal.     Left Ear: External ear normal.  Eyes:     Conjunctiva/sclera: Conjunctivae normal.  Neck:     Comments: Neg spurling Cardiovascular:     Rate and Rhythm: Normal rate and regular rhythm.     Heart sounds: No murmur heard.   Pulmonary:     Effort: Pulmonary effort is normal. No respiratory distress.      Breath sounds: Normal breath sounds. No wheezing.  Musculoskeletal:     Cervical back: Normal range of motion and neck supple. No rigidity or tenderness.     Comments: Left arm: no ttp, negative elbow tinel, neg wrist tinel and phalen. Normal rom and strength  Skin:    General: Skin is warm and dry.     Capillary Refill: Capillary refill takes less than 2 seconds.  Neurological:     Mental Status: She is alert. Mental status is at baseline.  Psychiatric:        Mood and Affect: Mood normal.        Behavior: Behavior normal.           Assessment & Plan:   Problem List Items Addressed This Visit      Musculoskeletal and Integument   Alopecia - Primary    Dx by dermatologist with biopsy and was benefiting from injections, but needs a new dermatologist for continued care.       Relevant Orders   Ambulatory referral to Dermatology  Other   Herpes    Suppression therapy working. Cont Valacyclovir daily       Obesity (BMI 30.0-34.9)   Relevant Orders   Comprehensive metabolic panel   Left arm numbness    Etiology unclear. Does not seem to have neck symptoms. Wonder about muscle and fascial related small nerve compression. Advised trial of PT to see if that could improve or localize cause. She will call back when she has time to go.       Left ankle swelling    Due to prior ankle fracture and repair. Is working with ortho. Start compression socks daily to see if that improves. Appreciate ortho support.        Other Visit Diagnoses    Need for hepatitis C screening test       Relevant Orders   Hepatitis C antibody   Screening for hyperlipidemia       Relevant Orders   Lipid panel       Return in about 1 year (around 10/28/2021).  Lesleigh Noe, MD  This visit occurred during the SARS-CoV-2 public health emergency.  Safety protocols were in place, including screening questions prior to the visit, additional usage of staff PPE, and extensive cleaning of  exam room while observing appropriate contact time as indicated for disinfecting solutions.

## 2020-10-29 LAB — COMPREHENSIVE METABOLIC PANEL
ALT: 19 U/L (ref 0–35)
AST: 15 U/L (ref 0–37)
Albumin: 4.1 g/dL (ref 3.5–5.2)
Alkaline Phosphatase: 97 U/L (ref 39–117)
BUN: 11 mg/dL (ref 6–23)
CO2: 28 mEq/L (ref 19–32)
Calcium: 11.2 mg/dL — ABNORMAL HIGH (ref 8.4–10.5)
Chloride: 107 mEq/L (ref 96–112)
Creatinine, Ser: 0.85 mg/dL (ref 0.40–1.20)
GFR: 81.29 mL/min (ref >60.00)
Glucose, Bld: 112 mg/dL — ABNORMAL HIGH (ref 70–99)
Potassium: 4.3 mEq/L (ref 3.5–5.1)
Sodium: 138 mEq/L (ref 135–145)
Total Bilirubin: 0.4 mg/dL (ref 0.2–1.2)
Total Protein: 7.3 g/dL (ref 6.0–8.3)

## 2020-10-29 LAB — LIPID PANEL
Cholesterol: 169 mg/dL (ref 0–200)
HDL: 42.1 mg/dL (ref >39.00)
LDL Cholesterol: 115 mg/dL — ABNORMAL HIGH (ref 0–99)
NonHDL: 126.74
Total CHOL/HDL Ratio: 4
Triglycerides: 60 mg/dL (ref 0.0–149.0)
VLDL: 12 mg/dL (ref 0.0–40.0)

## 2020-10-29 LAB — HEPATITIS C ANTIBODY
Hepatitis C Ab: NONREACTIVE
SIGNAL TO CUT-OFF: 0.01 (ref <1.00)

## 2020-10-30 ENCOUNTER — Encounter: Payer: Self-pay | Admitting: Family Medicine

## 2020-10-30 NOTE — Progress Notes (Signed)
Gabriell L Shawley  10/30/2020      Your procedure is scheduled on  11/19/2020   Report to [facility].M.  Call this number if you have problems the morning of surgery:[phone]  OUR ADDRESS IS [place], WE ARE [facility]ATED [facility] WITH [facility].   Remember:  Do not eat food  after midnight. May have clear liquids from 12 midnite until 057morning of surgery.  Drink Ensure preop drink by 0915am morning of surgery .Marland Kitchen   Take these medicines the morning of surgery with A SIP OF WATER none    Do not wear jewelry, make-up or nail polish.  Do not wear lotions, powders, or perfumes, or deoderant.  Do not shave 48 hours prior to surgery.  Men may shave face and neck.  Do not bring valuables to the hospital.  [facility] is not responsible for any belongings or valuables.  Contacts, dentures or bridgework may not be worn into surgery.  Leave your suitcase in the car.  After surgery it may be brought to your room.  For patients admitted to the hospital, discharge time will be determined by your treatment team.  Patients discharged the day of surgery will not be allowed to drive home.     Please read over the following fact sheets that you were given: [facility] - Preparing for Surgery Before surgery, you can play an important role.  Because skin is not sterile, your skin needs to be as free of germs as possible.  You can reduce the number of germs on your skin by washing with CHG (chlorahexidine gluconate) soap before surgery.  CHG is an antiseptic cleaner which kills germs and bonds with the skin to continue killing germs even after washing. Please DO NOT use if you have an allergy to CHG or antibacterial soaps.  If your skin becomes reddened/irritated stop using the CHG and inform your nurse when you arrive at Short Stay. Do not shave (including legs and underarms) for at least 48 hours prior to the first CHG shower.  You may  shave your face/neck. Please follow these instructions carefully:  1.  Shower with CHG Soap the night before surgery and the  morning of Surgery.  2.  If you choose to wash your hair, wash your hair first as usual with your  normal  shampoo.  3.  After you shampoo, rinse your hair and body thoroughly to remove the  shampoo.                           4.  Use CHG as you would any other liquid soap.  You can apply chg directly  to the skin and wash                       Gently with a scrungie or clean washcloth.  5.  Apply the CHG Soap to your body ONLY FROM THE NECK DOWN.   Do not use on face/ open                           Wound or open sores. Avoid contact with eyes, ears mouth and genitals (private parts).                       Wash face,  Genitals (  private parts) with your normal soap.             6.  Wash thoroughly, paying special attention to the area where your surgery  will be performed.  7.  Thoroughly rinse your body with warm water from the neck down.  8.  DO NOT shower/wash with your normal soap after using and rinsing off  the CHG Soap.                9.  Pat yourself dry with a clean towel.            10.  Wear clean pajamas.            11.  Place clean sheets on your bed the night of your first shower and do not  sleep with pets. Day of Surgery : Do not apply any lotions/deodorants the morning of surgery.  Please wear clean clothes to the hospital/surgery center.  FAILURE TO FOLLOW THESE INSTRUCTIONS MAY RESULT IN THE CANCELLATION OF YOUR SURGERY PATIENT SIGNATURE_________________________________  NURSE SIGNATURE__________________________________  ________________________________________________________________________

## 2020-11-03 ENCOUNTER — Encounter (HOSPITAL_COMMUNITY): Payer: Self-pay

## 2020-11-03 ENCOUNTER — Other Ambulatory Visit: Payer: Self-pay

## 2020-11-03 ENCOUNTER — Encounter (HOSPITAL_COMMUNITY)
Admission: RE | Admit: 2020-11-03 | Discharge: 2020-11-03 | Disposition: A | Discharge status: Home / Self Care | Payer: BC Managed Care – PPO | Source: Ambulatory Visit | Attending: Obstetrics and Gynecology | Admitting: Obstetrics and Gynecology

## 2020-11-03 DIAGNOSIS — Z01812 Encounter for preprocedural laboratory examination: Secondary | ICD-10-CM | POA: Insufficient documentation

## 2020-11-03 NOTE — Progress Notes (Signed)
Labs 11-14-2020 1530 pm Florentina Jenny has chart

## 2020-11-05 ENCOUNTER — Encounter: Payer: Self-pay | Admitting: Family Medicine

## 2020-11-08 IMAGING — US US THYROID
1 series · 12 of 25 positions shown · non-contrast
Comparison: None.

CLINICAL DATA: Goiter. History of hyperparathyroidism and left
inferior parathyroid adenoma.

EXAM:
THYROID ULTRASOUND
TECHNIQUE: Ultrasound examination of the thyroid gland and adjacent soft
tissues was performed.

[Series 1: us thyroid · 0.05mm/px · 12 of 61 slices shown]
[im 3/61]
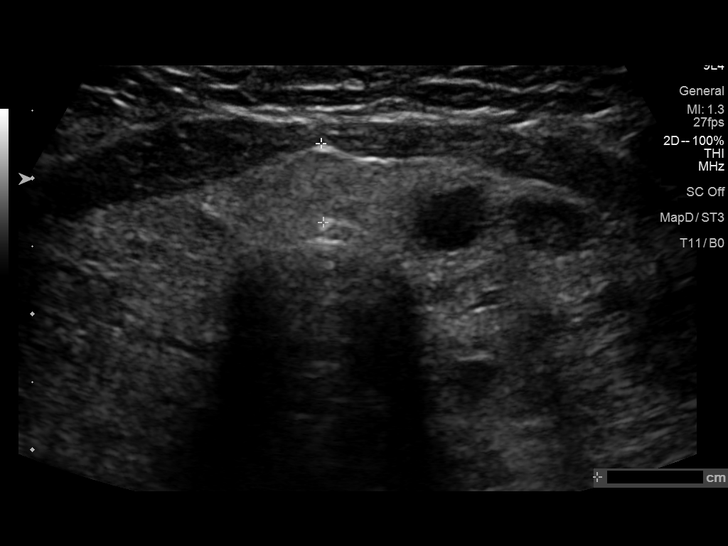
[im 8/61]
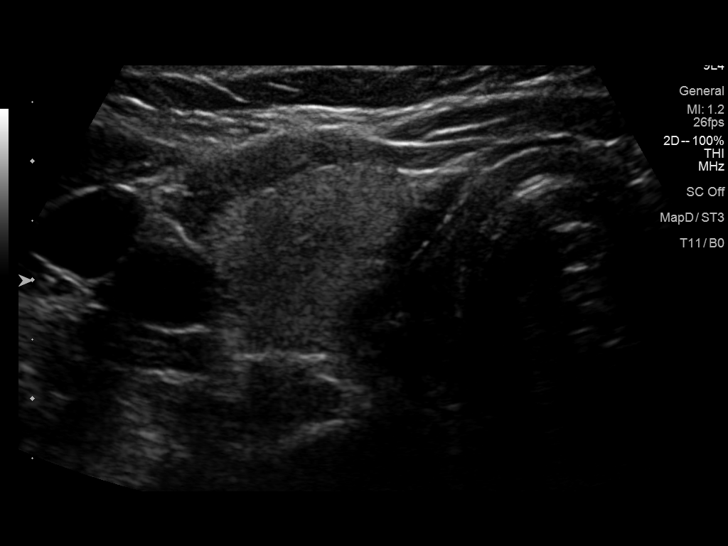
[im 13/61]
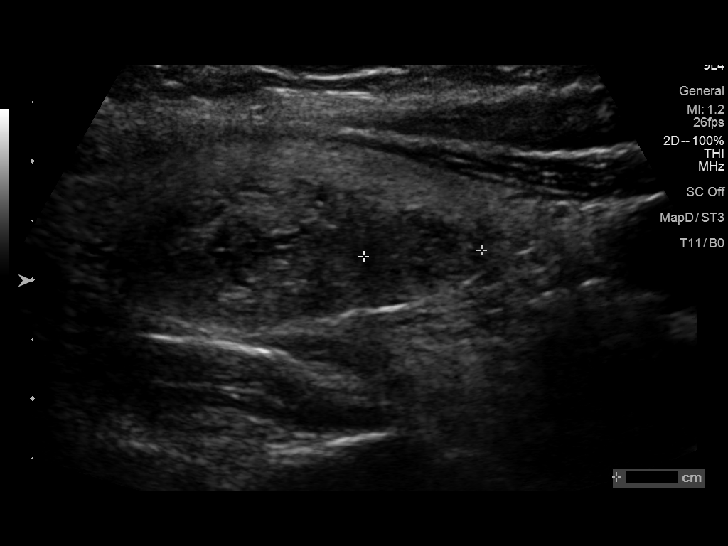
[im 18/61]
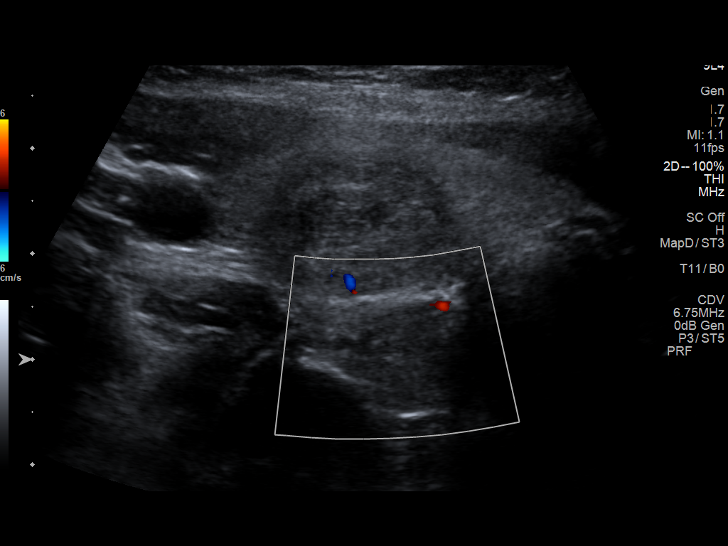
[im 23/61]
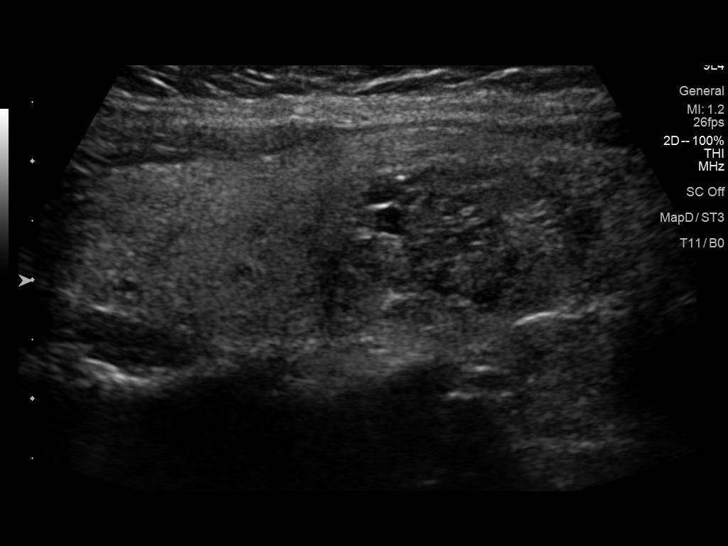
[im 28/61]
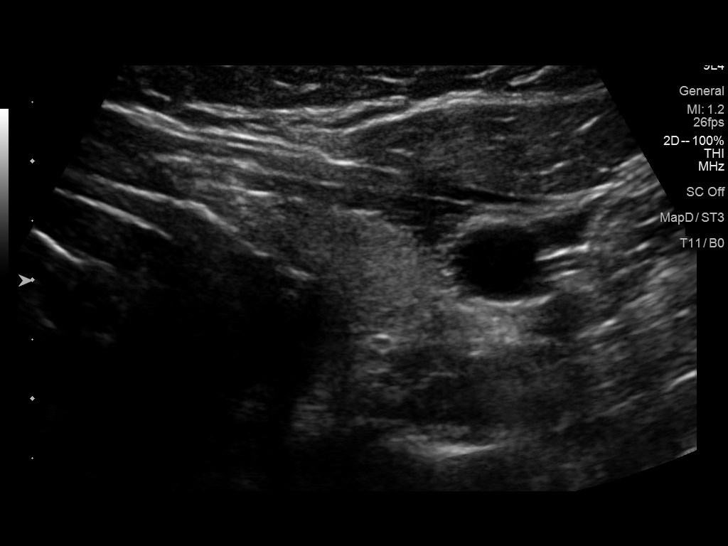
[im 33/61]
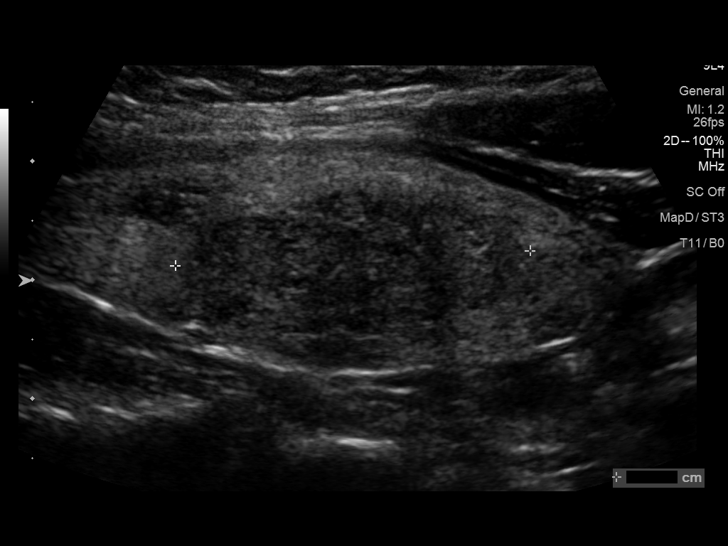
[im 38/61]
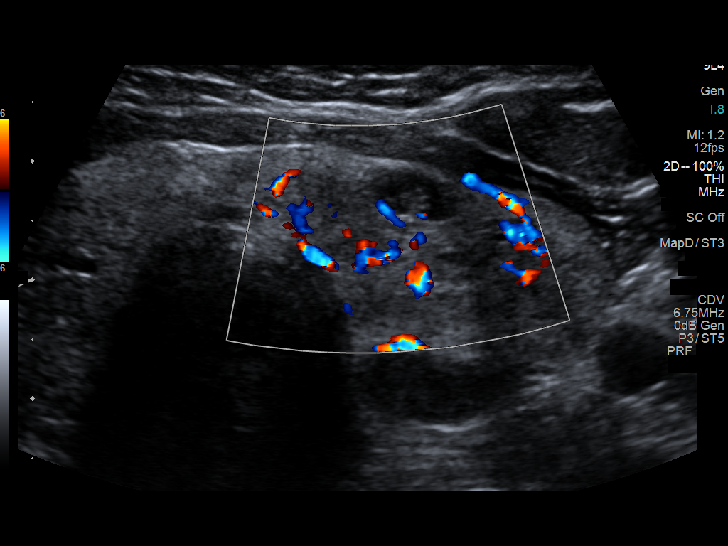
[im 43/61]
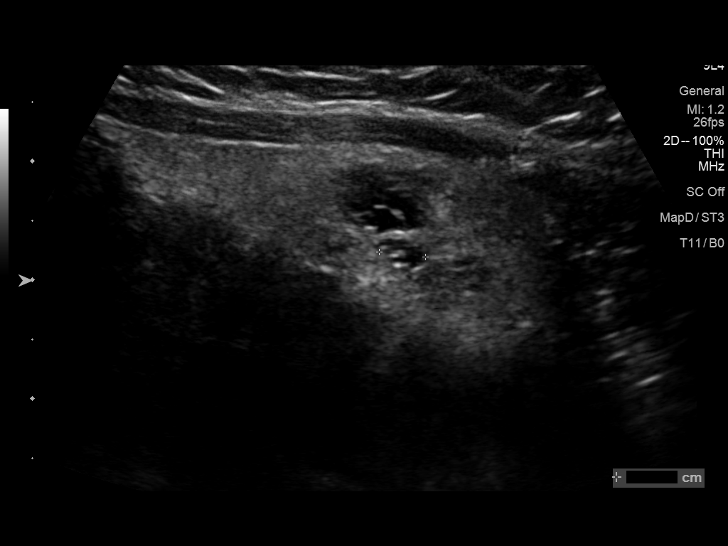
[im 48/61]
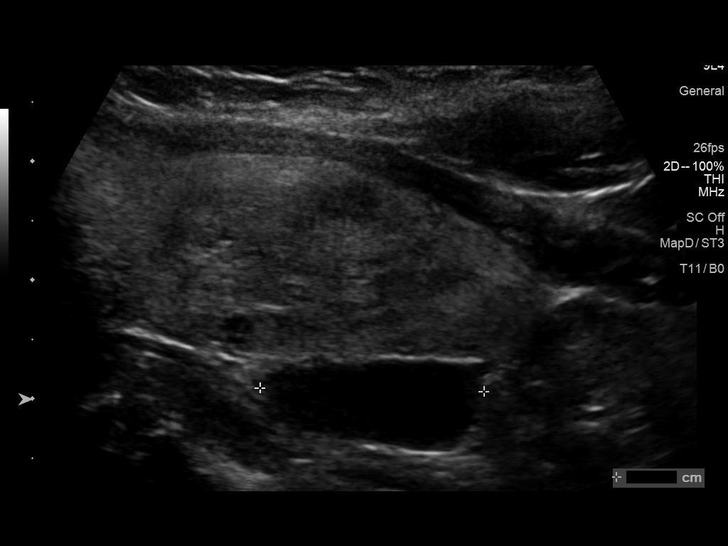
[im 53/61]
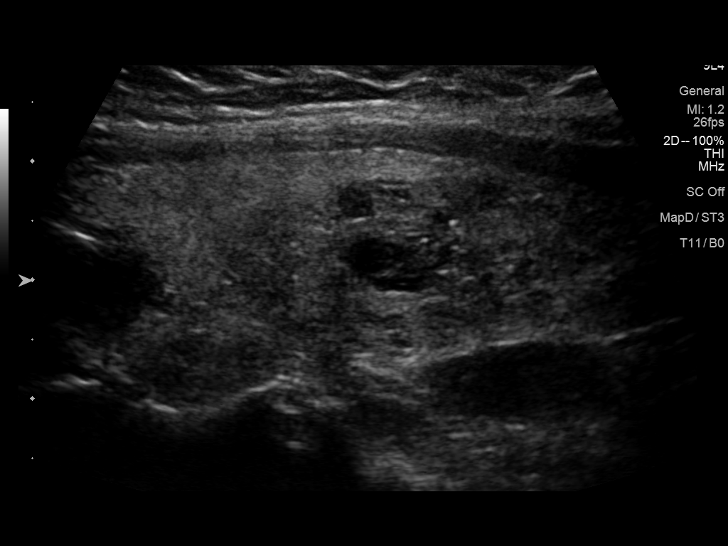
[im 58/61]
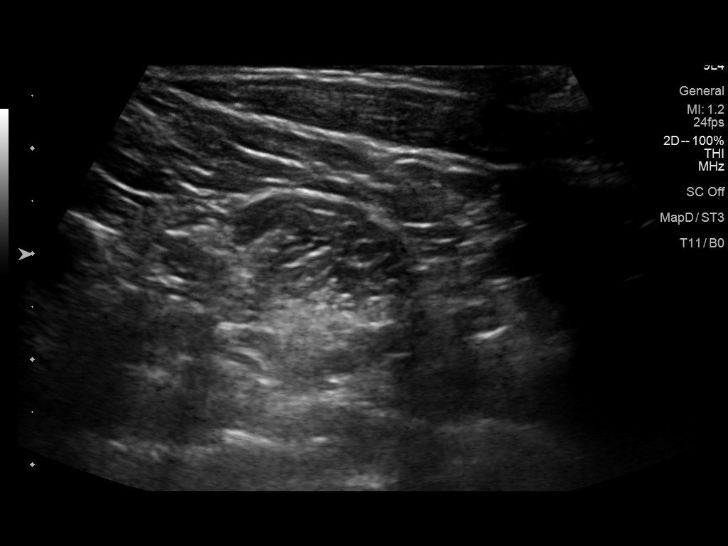

[12 of 25 positions shown; findings below may reference images not displayed]

FINDINGS: Parenchymal Echotexture: Moderately heterogenous

Isthmus: 0.6 cm

Right lobe: 6.0 x 2.0 x 2.6 cm

Left lobe: 5.4 x 2.0 x 2.3 cm

_________________________________________________________

Estimated total number of nodules >/= 1 cm: 6-10

Number of spongiform nodules >/=  2 cm not described below (TR1): 0

Number of mixed cystic and solid nodules >/= 1.5 cm not described
below (TR2): 0

_________________________________________________________

Nodule # 1:

Location: Right; Mid

Maximum size: 2.5 cm; Other 2 dimensions: 2.0 x 1.4 cm

Composition: solid/almost completely solid (2)

Echogenicity: hypoechoic (2)

Shape: not taller-than-wide (0)

Margins: ill-defined (0)

Echogenic foci: none (0)

ACR TI-RADS total points: 4.

ACR TI-RADS risk category: TR4 (4-6 points).

ACR TI-RADS recommendations:

**Given size (>/= 1.5 cm) and appearance, fine needle aspiration of
this moderately suspicious nodule should be considered based on
TI-RADS criteria.

_________________________________________________________

Nodule # 2: Small 1.0 cm nodule demonstrates a spongiform morphology
which is considered sonographically benign. No further follow-up
required.

_________________________________________________________

Nodule # 3:

Location: Right; Inferior

Maximum size: 1.2 cm; Other 2 dimensions: 1.1 x 1.1 cm

Composition: solid/almost completely solid (2)

Echogenicity: hypoechoic (2)

Shape: not taller-than-wide (0)

Margins: ill-defined (0)

Echogenic foci: none (0)

ACR TI-RADS total points: 4.

ACR TI-RADS risk category: TR4 (4-6 points).

ACR TI-RADS recommendations:

*Given size (>/= 1 - 1.4 cm) and appearance, a follow-up ultrasound
in 1 year should be considered based on TI-RADS criteria.

_________________________________________________________

Nodule # 4: Subcentimeter nodule in the left upper gland does not
meet criteria for further evaluation.

_________________________________________________________

Nodule # 5:

Location: Left; Mid

Maximum size: 3.0 cm; Other 2 dimensions: 2.2 x 1.6 cm

Composition: solid/almost completely solid (2)

Echogenicity: hypoechoic (2)

Shape: not taller-than-wide (0)

Margins: ill-defined (0)

Echogenic foci: none (0)

ACR TI-RADS total points: 4.

ACR TI-RADS risk category: TR4 (4-6 points).

ACR TI-RADS recommendations:

**Given size (>/= 1.5 cm) and appearance, fine needle aspiration of
this moderately suspicious nodule should be considered based on
TI-RADS criteria.

_________________________________________________________

Multiple additional subcentimeter nodules noted throughout the left
mid and lower gland. Circumscribed anechoic cystic lesion posterior
to the left inferior gland measures 1.9 x 1.0 x 0.8 cm. This appears
cystic and does not demonstrate solid features or internal
vascularity.
IMPRESSION: 1. Diffusely enlarged, heterogeneous and multinodular thyroid gland
most consistent with multinodular goiter.
2. Approximately 2.5 cm TI-RADS category 4 nodule in the right mid
gland meets criteria to consider fine-needle aspiration biopsy.
3. Approximately 3 cm TI-RADS category 4 nodule in the left mid
gland meets criteria to consider fine-needle aspiration biopsy.
4. The 1.2 cm TI-RADS category 4 nodule in the right inferior gland
meets criteria for surveillance. Recommend follow-up ultrasound in 1
year.
5. Multiple additional small and morphologically benign nodules
present throughout the gland.
6. Sonographically simple cyst posterior to the lower pole of the
left thyroid gland may be related to the patient's reported prior
parathyroid adenoma.

The above is in keeping with the ACR TI-RADS recommendations - [HOSPITAL] 0615;[DATE].

## 2020-11-12 NOTE — H&P (Addendum)
Ana Freeman is a 49 y.o. female, G:0 presenting for hysterectomy because of symptomatic uterine fibroids and menorrhagia. For over a year the patient has had heavy bleeding for which she had an endometrial ablation in July 2021 and shortly thereafter, placement of a Mirena IUD. Since that time she has continued to bleed, 10-30 days at a time, changing her pad 2-4 times a day and on occasion  passing clots.  She goes on to report menstrual cramping rated 6/10 and positional dyspareunia.  She denies any bowel or bladder changes or vaginitis symptoms. An endometrial biopsy prior to the ablation showed benign endometrium. A pelvic ultrasound in October 2021 revealed an anteverted uterus [9 cm from fundus to external os] 6.50 x 5.62 x 6.37 cm, endometrium: 5.41 mm with IUD in place; #4 fibroids: left sub-mucosal-2.48 cm, anterior intramural-1.66 cm, posterior intramural-2.97 cm and anterior intramural-1.52 cm; right ovary-3.05 cm and left ovary-3.18 cm.   A review of both medical and surgical management options were given to the patient however,  she has decided to proceed with definitive therapy in the form of hysterectomy.  Past Medical History  OB History:G:0  GYN History: menarche: 49 YO;  LMP: see HPI   Contraception: Mirena IUD;  Has a remote history of abnormal PAP smear that was repeated and returned normal.  Last PAP smear:2021-normal  Medical History: GERD, Hyperlipidemia, L-1 Compression Fracture; Left Frontal Brain Lesion, Anemia, Alopecia, HSV-1, Vitamin D Deficiency, Peptic Ulcer Disease and Acne.  Surgical History: 2017: Abdominal Myomectomy and Hysteroscopic Resection of Fibroid; 2018 Removal of Right Upper Arm Mass; 2020: Left Tibular-Fibular  and Right Ankle ORIF of Fracture; 2020 Hysteroscopy with Myosure Fibroid Removal; 2021 Endometrial Ablation Denies problems with anesthesia but underwent a  blood transfusion in 2020  Family History: Hypertension, Diabetes Mellitus, Anemia, Lung  Cancer, Ovarian Cancer, Alzheimer's and Brain Tumor  Social History: Married and employed in Administration at [employer];  Denies tobacco or alcohol use.   Medications: Mirena IUD Omeprazole 20 mg daily prn Valacyclovir 500 mg   No Known Allergies  Denies sensitivity to peanuts, shellfish, soy, latex or adhesives.   ROS: Admits to glasses and bilateral tinnitus (right > left) but  denies headache, vision changes, nasal congestion, dysphagia,  dizziness, hoarseness, cough,  chest pain, shortness of breath, nausea, vomiting, diarrhea,constipation,  urinary frequency, urgency  dysuria, hematuria, vaginitis symptoms,  swelling of joints,easy bruising,  myalgias, arthralgias, skin rashes, unexplained weight loss and except as is mentioned in the history of present illness, patient's review of systems is otherwise negative.    Physical Exam  Bp: 100/60;  P: 73 bpm;  R: 16;  Temperature: 98.4 degrees F orally;  Height: 5'4"'  Weight: 184 lbs.; BMI:31.8; O2Sat.: 100% (room air)  Neck: supple without masses or thyromegaly Lungs: clear to auscultation Heart: regular rate and rhythm Abdomen: soft, non-tender and no organomegaly Pelvic:EGBUS- wnl; vagina-normal rugae; uterus-upper limits of normal size, cervix without lesions or motion tenderness, IUD strings visible; adnexae-no tenderness or masses Extremities:  no clubbing, cyanosis or edema   Assesment:  Menorrhagia                       Symptomatic Uterine Fibroids   Disposition:  A discussion was held with patient regarding the indication for her procedure(s) along with the risks, which include but are not limited to: reaction to anesthesia, damage to adjacent organs, infection, excessive bleeding and the possible need for an open abdominal incision.  The patient  verbalized understanding of these risks and has consented to proceed with a Laparoscopically Assisted Vaginal Hysterectomy, Bilateral Salpingectomy and Possible Cystoscopy at  [facility] on  November 19, 2020   CSN# [id number]   [place] J. Florene Glen, PA-C  for Dr. Harvie Bridge. Mancel Bale   Pt has a family h/o ovarian cancer and has gone back to her previous decision of wanting them removed.  She understands she will go through iatrogenic menopause and HRT has been discussed with r/b/a.  Pt wants LAVH and BSO, poss TAH, poss cystoscopy.  R/b/a reviewed including but not limited to bleeding infection and injury.  Questions answered.  Consent being signed and witnessed.

## 2020-11-14 ENCOUNTER — Encounter (HOSPITAL_COMMUNITY)
Admission: RE | Admit: 2020-11-14 | Discharge: 2020-11-14 | Disposition: A | Discharge status: Home / Self Care | Payer: BC Managed Care – PPO | Source: Ambulatory Visit | Attending: Obstetrics and Gynecology | Admitting: Obstetrics and Gynecology

## 2020-11-14 ENCOUNTER — Other Ambulatory Visit: Payer: Self-pay

## 2020-11-14 DIAGNOSIS — Z01812 Encounter for preprocedural laboratory examination: Secondary | ICD-10-CM | POA: Diagnosis not present

## 2020-11-14 LAB — BASIC METABOLIC PANEL
Anion gap: 7 (ref 5–15)
BUN: 15 mg/dL (ref 6–20)
CO2: 25 mmol/L (ref 22–32)
Calcium: 11.4 mg/dL — ABNORMAL HIGH (ref 8.9–10.3)
Chloride: 106 mmol/L (ref 98–111)
Creatinine, Ser: 0.87 mg/dL (ref 0.44–1.00)
GFR, Estimated: >60 mL/min (ref >60)
Glucose, Bld: 88 mg/dL (ref 70–99)
Potassium: 5.3 mmol/L — ABNORMAL HIGH (ref 3.5–5.1)
Sodium: 138 mmol/L (ref 135–145)

## 2020-11-14 LAB — CBC
HCT: 41.3 % (ref 36.0–46.0)
Hemoglobin: 12.9 g/dL (ref 12.0–15.0)
MCH: 26.8 pg (ref 26.0–34.0)
MCHC: 31.2 g/dL (ref 30.0–36.0)
MCV: 85.7 fL (ref 80.0–100.0)
Platelets: 297 10*3/uL (ref 150–400)
RBC: 4.82 MIL/uL (ref 3.87–5.11)
RDW: 14.8 % (ref 11.5–15.5)
WBC: 3.7 10*3/uL — ABNORMAL LOW (ref 4.0–10.5)
nRBC: 0 % (ref 0.0–0.2)

## 2020-11-14 NOTE — Progress Notes (Signed)
BMP done 11/14/2020 routed to DR Mancel Bale.

## 2020-11-17 ENCOUNTER — Other Ambulatory Visit (HOSPITAL_COMMUNITY)
Admission: RE | Admit: 2020-11-17 | Discharge: 2020-11-17 | Disposition: A | Discharge status: Home / Self Care | Payer: BC Managed Care – PPO | Source: Ambulatory Visit | Attending: Obstetrics and Gynecology | Admitting: Obstetrics and Gynecology

## 2020-11-17 DIAGNOSIS — Z01812 Encounter for preprocedural laboratory examination: Secondary | ICD-10-CM | POA: Diagnosis not present

## 2020-11-17 DIAGNOSIS — Z20822 Contact with and (suspected) exposure to covid-19: Secondary | ICD-10-CM | POA: Insufficient documentation

## 2020-11-17 LAB — SARS CORONAVIRUS 2 (TAT 6-24 HRS): SARS Coronavirus 2: NEGATIVE

## 2020-11-18 ENCOUNTER — Encounter: Payer: Self-pay | Admitting: Family Medicine

## 2020-11-19 ENCOUNTER — Encounter (HOSPITAL_BASED_OUTPATIENT_CLINIC_OR_DEPARTMENT_OTHER)
Admission: RE | Disposition: A | Discharge status: Home / Self Care | Payer: Self-pay | Source: Home / Self Care | Attending: Obstetrics and Gynecology

## 2020-11-19 ENCOUNTER — Other Ambulatory Visit: Payer: Self-pay | Admitting: Obstetrics and Gynecology

## 2020-11-19 ENCOUNTER — Encounter (HOSPITAL_BASED_OUTPATIENT_CLINIC_OR_DEPARTMENT_OTHER): Payer: Self-pay | Admitting: Obstetrics and Gynecology

## 2020-11-19 ENCOUNTER — Ambulatory Visit (HOSPITAL_BASED_OUTPATIENT_CLINIC_OR_DEPARTMENT_OTHER)
Admission: RE | Admit: 2020-11-19 | Discharge: 2020-11-20 | Disposition: A | Discharge status: Home / Self Care | Payer: BC Managed Care – PPO | Attending: Obstetrics and Gynecology | Admitting: Obstetrics and Gynecology

## 2020-11-19 ENCOUNTER — Ambulatory Visit (HOSPITAL_BASED_OUTPATIENT_CLINIC_OR_DEPARTMENT_OTHER): Payer: BC Managed Care – PPO | Admitting: Anesthesiology

## 2020-11-19 ENCOUNTER — Other Ambulatory Visit: Payer: Self-pay

## 2020-11-19 ENCOUNTER — Other Ambulatory Visit: Payer: Self-pay | Admitting: Family Medicine

## 2020-11-19 DIAGNOSIS — N736 Female pelvic peritoneal adhesions (postinfective): Secondary | ICD-10-CM | POA: Insufficient documentation

## 2020-11-19 DIAGNOSIS — N8300 Follicular cyst of ovary, unspecified side: Secondary | ICD-10-CM | POA: Insufficient documentation

## 2020-11-19 DIAGNOSIS — N800 Endometriosis of uterus: Secondary | ICD-10-CM | POA: Insufficient documentation

## 2020-11-19 DIAGNOSIS — N920 Excessive and frequent menstruation with regular cycle: Secondary | ICD-10-CM | POA: Diagnosis present

## 2020-11-19 DIAGNOSIS — D251 Intramural leiomyoma of uterus: Secondary | ICD-10-CM | POA: Diagnosis not present

## 2020-11-19 DIAGNOSIS — N83299 Other ovarian cyst, unspecified side: Secondary | ICD-10-CM | POA: Diagnosis not present

## 2020-11-19 DIAGNOSIS — N838 Other noninflammatory disorders of ovary, fallopian tube and broad ligament: Secondary | ICD-10-CM | POA: Insufficient documentation

## 2020-11-19 DIAGNOSIS — Z79899 Other long term (current) drug therapy: Secondary | ICD-10-CM | POA: Diagnosis not present

## 2020-11-19 DIAGNOSIS — D219 Benign neoplasm of connective and other soft tissue, unspecified: Secondary | ICD-10-CM | POA: Diagnosis present

## 2020-11-19 DIAGNOSIS — Z8041 Family history of malignant neoplasm of ovary: Secondary | ICD-10-CM | POA: Insufficient documentation

## 2020-11-19 DIAGNOSIS — D259 Leiomyoma of uterus, unspecified: Secondary | ICD-10-CM | POA: Diagnosis not present

## 2020-11-19 DIAGNOSIS — D250 Submucous leiomyoma of uterus: Secondary | ICD-10-CM | POA: Insufficient documentation

## 2020-11-19 DIAGNOSIS — N72 Inflammatory disease of cervix uteri: Secondary | ICD-10-CM | POA: Diagnosis not present

## 2020-11-19 DIAGNOSIS — Z30432 Encounter for removal of intrauterine contraceptive device: Secondary | ICD-10-CM | POA: Diagnosis not present

## 2020-11-19 HISTORY — PX: LAPAROSCOPIC VAGINAL HYSTERECTOMY WITH SALPINGECTOMY: SHX6680

## 2020-11-19 HISTORY — PX: CYSTOSCOPY: SHX5120

## 2020-11-19 LAB — POCT PREGNANCY, URINE: Preg Test, Ur: NEGATIVE

## 2020-11-19 LAB — TYPE AND SCREEN
ABO/RH(D): O POS
Antibody Screen: NEGATIVE

## 2020-11-19 SURGERY — HYSTERECTOMY, VAGINAL, LAPAROSCOPY-ASSISTED, WITH SALPINGECTOMY
Anesthesia: General | Site: Bladder

## 2020-11-19 MED ORDER — ONDANSETRON HCL 4 MG/2ML IJ SOLN
INTRAMUSCULAR | Status: AC
  Filled 2020-11-19: qty 2

## 2020-11-19 MED ORDER — PROPOFOL 10 MG/ML IV BOLUS
INTRAVENOUS | Status: AC
  Filled 2020-11-19: qty 20

## 2020-11-19 MED ORDER — KETOROLAC TROMETHAMINE 30 MG/ML IJ SOLN
INTRAMUSCULAR | Status: AC
  Filled 2020-11-19: qty 1

## 2020-11-19 MED ORDER — OXYCODONE-ACETAMINOPHEN 5-325 MG PO TABS
ORAL_TABLET | ORAL | Status: AC
  Filled 2020-11-19: qty 2

## 2020-11-19 MED ORDER — FENTANYL CITRATE (PF) 100 MCG/2ML IJ SOLN
INTRAMUSCULAR | Status: AC
  Filled 2020-11-19: qty 2

## 2020-11-19 MED ORDER — MIDAZOLAM HCL 5 MG/5ML IJ SOLN
INTRAMUSCULAR | Status: DC | PRN
  Administered 2020-11-19 (×2): 1 mg via INTRAVENOUS

## 2020-11-19 MED ORDER — PROMETHAZINE HCL 25 MG/ML IJ SOLN
25.0000 mg | Freq: Four times a day (QID) | INTRAMUSCULAR | Status: DC | PRN
  Administered 2020-11-19: 25 mg via INTRAVENOUS

## 2020-11-19 MED ORDER — OXYCODONE HCL 5 MG/5ML PO SOLN: 5.0000 mg | Freq: Once | ORAL | Status: DC | PRN

## 2020-11-19 MED ORDER — SODIUM CHLORIDE 0.9 % IR SOLN
Status: DC | PRN
  Administered 2020-11-19: 200 mL via INTRAVESICAL

## 2020-11-19 MED ORDER — HYDROMORPHONE HCL 1 MG/ML IJ SOLN
INTRAMUSCULAR | Status: AC
  Filled 2020-11-19: qty 1

## 2020-11-19 MED ORDER — SODIUM CHLORIDE 0.9 % IR SOLN
Status: DC | PRN
  Administered 2020-11-19: 500 mL

## 2020-11-19 MED ORDER — KETOROLAC TROMETHAMINE 30 MG/ML IJ SOLN
INTRAMUSCULAR | Status: DC | PRN
  Administered 2020-11-19: 30 mg via INTRAVENOUS

## 2020-11-19 MED ORDER — ROCURONIUM BROMIDE 10 MG/ML (PF) SYRINGE
PREFILLED_SYRINGE | INTRAVENOUS | Status: AC
  Filled 2020-11-19: qty 10

## 2020-11-19 MED ORDER — BUPIVACAINE HCL (PF) 0.25 % IJ SOLN
INTRAMUSCULAR | Status: DC | PRN
  Administered 2020-11-19: 10 mL

## 2020-11-19 MED ORDER — VASOPRESSIN 20 UNIT/ML IV SOLN
INTRAVENOUS | Status: DC | PRN
  Administered 2020-11-19: 10 mL via INTRAMUSCULAR

## 2020-11-19 MED ORDER — LACTATED RINGERS IV SOLN
INTRAVENOUS | Status: DC
  Administered 2020-11-19: 1000 mL via INTRAVENOUS

## 2020-11-19 MED ORDER — KETOROLAC TROMETHAMINE 30 MG/ML IJ SOLN
30.0000 mg | Freq: Four times a day (QID) | INTRAMUSCULAR | Status: AC
  Administered 2020-11-19 – 2020-11-20 (×2): 30 mg via INTRAVENOUS

## 2020-11-19 MED ORDER — ESMOLOL HCL 100 MG/10ML IV SOLN
INTRAVENOUS | Status: DC | PRN
  Administered 2020-11-19: 20 mg via INTRAVENOUS

## 2020-11-19 MED ORDER — OXYCODONE HCL 5 MG PO TABS
5.0000 mg | ORAL_TABLET | Freq: Four times a day (QID) | ORAL | Status: DC | PRN
  Administered 2020-11-20 (×2): 10 mg via ORAL

## 2020-11-19 MED ORDER — CEFAZOLIN SODIUM-DEXTROSE 2-4 GM/100ML-% IV SOLN
2.0000 g | INTRAVENOUS | Status: AC
  Administered 2020-11-19: 2 g via INTRAVENOUS

## 2020-11-19 MED ORDER — PANTOPRAZOLE SODIUM 40 MG PO TBEC: 40.0000 mg | DELAYED_RELEASE_TABLET | Freq: Every day | ORAL | Status: DC

## 2020-11-19 MED ORDER — ESMOLOL HCL 100 MG/10ML IV SOLN
INTRAVENOUS | Status: AC
  Filled 2020-11-19: qty 10

## 2020-11-19 MED ORDER — ONDANSETRON HCL 4 MG/2ML IJ SOLN
INTRAMUSCULAR | Status: DC | PRN
  Administered 2020-11-19: 4 mg via INTRAVENOUS

## 2020-11-19 MED ORDER — ONDANSETRON HCL 4 MG/2ML IJ SOLN
4.0000 mg | Freq: Four times a day (QID) | INTRAMUSCULAR | Status: DC | PRN
  Administered 2020-11-19: 4 mg via INTRAVENOUS

## 2020-11-19 MED ORDER — DOCUSATE SODIUM 100 MG PO CAPS
100.0000 mg | ORAL_CAPSULE | Freq: Two times a day (BID) | ORAL | Status: DC
  Administered 2020-11-20: 100 mg via ORAL

## 2020-11-19 MED ORDER — CEFAZOLIN SODIUM-DEXTROSE 2-4 GM/100ML-% IV SOLN
INTRAVENOUS | Status: AC
  Filled 2020-11-19: qty 100

## 2020-11-19 MED ORDER — ONDANSETRON HCL 4 MG/2ML IJ SOLN: 4.0000 mg | Freq: Once | INTRAMUSCULAR | Status: DC | PRN

## 2020-11-19 MED ORDER — ONDANSETRON HCL 4 MG PO TABS: 4.0000 mg | ORAL_TABLET | Freq: Four times a day (QID) | ORAL | Status: DC | PRN

## 2020-11-19 MED ORDER — HYDROMORPHONE HCL 1 MG/ML IJ SOLN
INTRAMUSCULAR | Status: DC | PRN
  Administered 2020-11-19 (×4): .5 mg via INTRAVENOUS

## 2020-11-19 MED ORDER — FENTANYL CITRATE (PF) 250 MCG/5ML IJ SOLN
INTRAMUSCULAR | Status: AC
  Filled 2020-11-19: qty 5

## 2020-11-19 MED ORDER — OXYCODONE-ACETAMINOPHEN 5-325 MG PO TABS
1.0000 | ORAL_TABLET | Freq: Four times a day (QID) | ORAL | Status: DC | PRN
  Administered 2020-11-19: 2 via ORAL

## 2020-11-19 MED ORDER — HYDROMORPHONE HCL 2 MG/ML IJ SOLN
INTRAMUSCULAR | Status: AC
  Filled 2020-11-19: qty 1

## 2020-11-19 MED ORDER — LIDOCAINE HCL (CARDIAC) PF 100 MG/5ML IV SOSY
PREFILLED_SYRINGE | INTRAVENOUS | Status: DC | PRN
  Administered 2020-11-19: 40 mg via INTRAVENOUS

## 2020-11-19 MED ORDER — PROPOFOL 10 MG/ML IV BOLUS
INTRAVENOUS | Status: DC | PRN
  Administered 2020-11-19: 170 mg via INTRAVENOUS

## 2020-11-19 MED ORDER — SUGAMMADEX SODIUM 200 MG/2ML IV SOLN
INTRAVENOUS | Status: DC | PRN
  Administered 2020-11-19: 180 mg via INTRAVENOUS

## 2020-11-19 MED ORDER — AMISULPRIDE (ANTIEMETIC) 5 MG/2ML IV SOLN: 10.0000 mg | Freq: Once | INTRAVENOUS | Status: DC | PRN

## 2020-11-19 MED ORDER — MIDAZOLAM HCL 2 MG/2ML IJ SOLN
INTRAMUSCULAR | Status: AC
  Filled 2020-11-19: qty 2

## 2020-11-19 MED ORDER — FENTANYL CITRATE (PF) 100 MCG/2ML IJ SOLN
25.0000 ug | INTRAMUSCULAR | Status: DC | PRN
  Administered 2020-11-19 (×5): 50 ug via INTRAVENOUS

## 2020-11-19 MED ORDER — POVIDONE-IODINE 10 % EX SWAB
2.0000 "application " | Freq: Once | CUTANEOUS | Status: AC
  Administered 2020-11-19: 2 via TOPICAL

## 2020-11-19 MED ORDER — HYDROMORPHONE HCL 1 MG/ML IJ SOLN
1.0000 mg | INTRAMUSCULAR | Status: DC | PRN
  Administered 2020-11-19: 1 mg via INTRAVENOUS

## 2020-11-19 MED ORDER — SIMETHICONE 80 MG PO CHEW: 80.0000 mg | CHEWABLE_TABLET | Freq: Four times a day (QID) | ORAL | Status: DC | PRN

## 2020-11-19 MED ORDER — LACTATED RINGERS IV SOLN: INTRAVENOUS | Status: DC

## 2020-11-19 MED ORDER — PROMETHAZINE HCL 25 MG/ML IJ SOLN
INTRAMUSCULAR | Status: AC
  Filled 2020-11-19: qty 1

## 2020-11-19 MED ORDER — MENTHOL 3 MG MT LOZG: 1.0000 | LOZENGE | OROMUCOSAL | Status: DC | PRN

## 2020-11-19 MED ORDER — OXYCODONE HCL 5 MG PO TABS: 5.0000 mg | ORAL_TABLET | Freq: Once | ORAL | Status: DC | PRN

## 2020-11-19 MED ORDER — DEXAMETHASONE SODIUM PHOSPHATE 10 MG/ML IJ SOLN
INTRAMUSCULAR | Status: DC | PRN
  Administered 2020-11-19: 5 mg via INTRAVENOUS

## 2020-11-19 MED ORDER — FENTANYL CITRATE (PF) 100 MCG/2ML IJ SOLN
INTRAMUSCULAR | Status: DC | PRN
  Administered 2020-11-19 (×2): 25 ug via INTRAVENOUS
  Administered 2020-11-19: 50 ug via INTRAVENOUS
  Administered 2020-11-19: 25 ug via INTRAVENOUS
  Administered 2020-11-19: 50 ug via INTRAVENOUS
  Administered 2020-11-19: 25 ug via INTRAVENOUS
  Administered 2020-11-19: 50 ug via INTRAVENOUS

## 2020-11-19 MED ORDER — ROCURONIUM BROMIDE 100 MG/10ML IV SOLN
INTRAVENOUS | Status: DC | PRN
  Administered 2020-11-19: 70 mg via INTRAVENOUS
  Administered 2020-11-19: 15 mg via INTRAVENOUS
  Administered 2020-11-19: 10 mg via INTRAVENOUS

## 2020-11-19 SURGICAL SUPPLY — 100 items
ADH SKN CLS APL DERMABOND .7 (GAUZE/BANDAGES/DRESSINGS) ×3
APL SKNCLS STERI-STRIP NONHPOA (GAUZE/BANDAGES/DRESSINGS)
APL SRG 38 LTWT LNG FL B (MISCELLANEOUS) ×3
APL SWBSTK 6 STRL LF DISP (MISCELLANEOUS) ×6
APPLICATOR ARISTA FLEXITIP XL (MISCELLANEOUS) ×3 IMPLANT
APPLICATOR COTTON TIP 6 STRL (MISCELLANEOUS) ×2 IMPLANT
APPLICATOR COTTON TIP 6IN STRL (MISCELLANEOUS) ×10
BARRIER ADHS 3X4 INTERCEED (GAUZE/BANDAGES/DRESSINGS) IMPLANT
BENZOIN TINCTURE PRP APPL 2/3 (GAUZE/BANDAGES/DRESSINGS) ×2 IMPLANT
BRR ADH 4X3 ABS CNTRL BYND (GAUZE/BANDAGES/DRESSINGS)
CANISTER SUCT 3000ML PPV (MISCELLANEOUS) ×2 IMPLANT
CLOSURE WOUND 1/2 X4 (GAUZE/BANDAGES/DRESSINGS)
CNTNR URN SCR LID CUP LEK RST (MISCELLANEOUS) ×2 IMPLANT
CONT SPEC 4OZ STRL OR WHT (MISCELLANEOUS)
COVER BACK TABLE 60X90IN (DRAPES) ×5 IMPLANT
COVER MAYO STAND STRL (DRAPES) ×10 IMPLANT
COVER WAND RF STERILE (DRAPES) ×5 IMPLANT
DECANTER SPIKE VIAL GLASS SM (MISCELLANEOUS) ×9 IMPLANT
DERMABOND ADVANCED (GAUZE/BANDAGES/DRESSINGS) ×2
DERMABOND ADVANCED .7 DNX12 (GAUZE/BANDAGES/DRESSINGS) ×3 IMPLANT
DISSECTOR ROUND CHERRY 3/8 STR (MISCELLANEOUS) ×2 IMPLANT
DRAPE WARM FLUID 44X44 (DRAPES) IMPLANT
DRSG OPSITE POSTOP 3X4 (GAUZE/BANDAGES/DRESSINGS) ×5 IMPLANT
DRSG OPSITE POSTOP 4X10 (GAUZE/BANDAGES/DRESSINGS) ×2 IMPLANT
DURAPREP 26ML APPLICATOR (WOUND CARE) ×5 IMPLANT
ELECT REM PT RETURN 9FT ADLT (ELECTROSURGICAL) ×5
ELECTRODE REM PT RTRN 9FT ADLT (ELECTROSURGICAL) ×3 IMPLANT
FORCEPS CUTTING 33CM 5MM (CUTTING FORCEPS) ×2 IMPLANT
GAUZE 4X4 16PLY RFD (DISPOSABLE) ×7 IMPLANT
GAUZE PACKING 2X5 YD STRL (GAUZE/BANDAGES/DRESSINGS) IMPLANT
GLOVE BIO SURGEON STRL SZ7 (GLOVE) ×10 IMPLANT
GLOVE BIO SURGEON STRL SZ7.5 (GLOVE) ×10 IMPLANT
GLOVE BIOGEL PI IND STRL 7.0 (GLOVE) ×4 IMPLANT
GLOVE BIOGEL PI IND STRL 7.5 (GLOVE) ×9 IMPLANT
GLOVE BIOGEL PI INDICATOR 7.0 (GLOVE)
GLOVE BIOGEL PI INDICATOR 7.5 (GLOVE) ×6
GLOVE ECLIPSE 6.5 STRL STRAW (GLOVE) ×5 IMPLANT
GOWN STRL REUS W/TWL LRG LVL3 (GOWN DISPOSABLE) ×6 IMPLANT
HEMOSTAT ARISTA ABSORB 3G PWDR (HEMOSTASIS) ×3 IMPLANT
HEMOSTAT SURGICEL 4X8 (HEMOSTASIS) IMPLANT
HOLDER FOLEY CATH W/STRAP (MISCELLANEOUS) ×3 IMPLANT
IV NS 1000ML (IV SOLUTION) ×5
IV NS 1000ML BAXH (IV SOLUTION) ×1 IMPLANT
IV NS IRRIG 3000ML ARTHROMATIC (IV SOLUTION) ×3 IMPLANT
KIT TURNOVER CYSTO (KITS) ×5 IMPLANT
NDL MAYO CATGUT SZ4 TPR NDL (NEEDLE) IMPLANT
NEEDLE HYPO 22GX1.5 SAFETY (NEEDLE) IMPLANT
NEEDLE INSUFFLATION 120MM (ENDOMECHANICALS) ×3 IMPLANT
NEEDLE MAYO CATGUT SZ4 (NEEDLE) IMPLANT
NS IRRIG 1000ML POUR BTL (IV SOLUTION) ×2 IMPLANT
NS IRRIG 500ML POUR BTL (IV SOLUTION) ×3 IMPLANT
PACK ABDOMINAL GYN (CUSTOM PROCEDURE TRAY) ×2 IMPLANT
PACK LAVH (CUSTOM PROCEDURE TRAY) ×5 IMPLANT
PACK ROBOTIC GOWN (GOWN DISPOSABLE) ×5 IMPLANT
PACK TRENDGUARD 450 HYBRID PRO (MISCELLANEOUS) ×1 IMPLANT
PAD OB MATERNITY 4.3X12.25 (PERSONAL CARE ITEMS) ×5 IMPLANT
PENCIL BUTTON HOLSTER BLD 10FT (ELECTRODE) ×3 IMPLANT
POUCH LAPAROSCOPIC INSTRUMENT (MISCELLANEOUS) IMPLANT
PROTECTOR NERVE ULNAR (MISCELLANEOUS) ×4 IMPLANT
RTRCTR C-SECT PINK 25CM LRG (MISCELLANEOUS) IMPLANT
SCISSORS LAP 5X35 DISP (ENDOMECHANICALS) IMPLANT
SET IRRIG Y TYPE TUR BLADDER L (SET/KITS/TRAYS/PACK) ×3 IMPLANT
SET SUCTION IRRIG HYDROSURG (IRRIGATION / IRRIGATOR) ×6 IMPLANT
SET TUBE SMOKE EVAC HIGH FLOW (TUBING) ×5 IMPLANT
SHEARS HARMONIC ACE PLUS 36CM (ENDOMECHANICALS) IMPLANT
SHEET LAVH (DRAPES) ×5 IMPLANT
SLEEVE SURGEON STRL (DRAPES) ×3 IMPLANT
SOLUTION ELECTROLUBE (MISCELLANEOUS) IMPLANT
SPONGE LAP 18X18 RF (DISPOSABLE) IMPLANT
STRIP CLOSURE SKIN 1/2X4 (GAUZE/BANDAGES/DRESSINGS) ×2 IMPLANT
SUT CHROMIC 2 0 CT 1 (SUTURE) ×2 IMPLANT
SUT CHROMIC 2 0 SH (SUTURE) IMPLANT
SUT MNCRL AB 3-0 PS2 27 (SUTURE) ×5 IMPLANT
SUT MON AB 3-0 SH 27 (SUTURE)
SUT MON AB 3-0 SH27 (SUTURE) IMPLANT
SUT MON AB 4-0 PS1 27 (SUTURE) ×5 IMPLANT
SUT PDS AB 1 CTX 36 (SUTURE) IMPLANT
SUT PLAIN 2 0 XLH (SUTURE) ×2 IMPLANT
SUT VIC AB 0 CT1 18XCR BRD8 (SUTURE) ×7 IMPLANT
SUT VIC AB 0 CT1 27 (SUTURE)
SUT VIC AB 0 CT1 27XBRD ANBCTR (SUTURE) ×4 IMPLANT
SUT VIC AB 0 CT1 36 (SUTURE) IMPLANT
SUT VIC AB 0 CT1 8-18 (SUTURE) ×15
SUT VIC AB 2-0 SH 27 (SUTURE) ×10
SUT VIC AB 2-0 SH 27X BRD (SUTURE) ×1 IMPLANT
SUT VIC AB 2-0 SH 27XBRD (SUTURE) ×1 IMPLANT
SUT VICRYL 0 TIES 12 18 (SUTURE) ×5 IMPLANT
SUT VICRYL 0 UR6 27IN ABS (SUTURE) ×5 IMPLANT
SYR BULB IRRIG 60ML STRL (SYRINGE) ×3 IMPLANT
SYR CONTROL 10ML LL (SYRINGE) IMPLANT
TOWEL OR 17X26 10 PK STRL BLUE (TOWEL DISPOSABLE) ×5 IMPLANT
TRAY FOL W/BAG SLVR 16FR STRL (SET/KITS/TRAYS/PACK) ×3 IMPLANT
TRAY FOLEY W/BAG SLVR 14FR LF (SET/KITS/TRAYS/PACK) ×2 IMPLANT
TRAY FOLEY W/BAG SLVR 16FR LF (SET/KITS/TRAYS/PACK) ×5
TRENDGUARD 450 HYBRID PRO PACK (MISCELLANEOUS) ×5
TROCAR BALLN 12MMX100 BLUNT (TROCAR) IMPLANT
TROCAR BLADELESS OPT 5 100 (ENDOMECHANICALS) ×13 IMPLANT
TROCAR XCEL NON-BLD 11X100MML (ENDOMECHANICALS) ×3 IMPLANT
UNDERPAD 30X36 HEAVY ABSORB (UNDERPADS AND DIAPERS) ×2 IMPLANT
WARMER LAPAROSCOPE (MISCELLANEOUS) ×5 IMPLANT

## 2020-11-19 NOTE — OR Nursing (Signed)
IUD removed by E. Florene Glen, [place] in [facility] OR #1.

## 2020-11-19 NOTE — Transfer of Care (Signed)
Immediate Anesthesia Transfer of Care Note  Patient: [place]  Procedure(s) Performed: LAPAROSCOPIC ASSISTED VAGINAL HYSTERECTOMY WITH SALPINGO-OOPHARECTOMY (Bilateral Abdomen) CYSTOSCOPY (N/A Bladder)  Patient Location: PACU  Anesthesia Type:General  Level of Consciousness: sedated  Airway & Oxygen Therapy: Patient Spontanous Breathing and Patient connected to nasal cannula oxygen  Post-op Assessment: Report given to RN  Post vital signs: Reviewed and stable  Last Vitals:  Vitals Value Taken Time  BP 145/83 11/19/20 1630  Temp    Pulse 111 11/19/20 1634  Resp 10 11/19/20 1634  SpO2 100 % 11/19/20 1634  Vitals shown include unvalidated device data.  Last Pain:  Vitals:   11/19/20 1050  TempSrc: Oral      Patients Stated Pain Goal: 7 (16/83/72 9021)  Complications: No complications documented.

## 2020-11-19 NOTE — Anesthesia Procedure Notes (Signed)
Procedure Name: Intubation Date/Time: 11/19/2020 12:53 PM Performed by: Garrel Ridgel, CRNA Pre-anesthesia Checklist: Patient identified, Emergency Drugs available, Suction available and Patient being monitored Patient Re-evaluated:Patient Re-evaluated prior to induction Oxygen Delivery Method: Circle system utilized Preoxygenation: Pre-oxygenation with 100% oxygen Induction Type: IV induction Ventilation: Mask ventilation without difficulty Laryngoscope Size: Mac and 3 Grade View: Grade II Tube type: Oral Tube size: 7.0 mm Number of attempts: 1 Airway Equipment and Method: Stylet and Oral airway Placement Confirmation: ETT inserted through vocal cords under direct vision,  positive ETCO2 and breath sounds checked- equal and bilateral Secured at: 22 cm Tube secured with: Tape Dental Injury: Teeth and Oropharynx as per pre-operative assessment

## 2020-11-19 NOTE — Anesthesia Preprocedure Evaluation (Addendum)
Anesthesia Evaluation  Patient identified by MRN, date of birth, ID band Patient awake    Reviewed: Allergy & Precautions, NPO status , Patient's Chart, lab work & pertinent test results  History of Anesthesia Complications Negative for: history of anesthetic complications  Airway Mallampati: II  TM Distance: >3 FB Neck ROM: Full    Dental  (+) Teeth Intact   Pulmonary neg pulmonary ROS,    Pulmonary exam normal        Cardiovascular negative cardio ROS Normal cardiovascular exam     Neuro/Psych negative neurological ROS  negative psych ROS   GI/Hepatic Neg liver ROS, GERD  ,  Endo/Other  negative endocrine ROS  Renal/GU negative Renal ROS  negative genitourinary   Musculoskeletal negative musculoskeletal ROS (+)   Abdominal   Peds  Hematology negative hematology ROS (+)   Anesthesia Other Findings   Reproductive/Obstetrics fibroids                            Anesthesia Physical Anesthesia Plan  ASA: II  Anesthesia Plan: General   Post-op Pain Management:    Induction: Intravenous  PONV Risk Score and Plan: 4 or greater and Ondansetron, Dexamethasone, Treatment may vary due to age or medical condition and Midazolam  Airway Management Planned: Oral ETT  Additional Equipment: None  Intra-op Plan:   Post-operative Plan: Extubation in OR  Informed Consent: I have reviewed the patients History and Physical, chart, labs and discussed the procedure including the risks, benefits and alternatives for the proposed anesthesia with the patient or authorized representative who has indicated his/her understanding and acceptance.     Dental advisory given  Plan Discussed with:   Anesthesia Plan Comments:        Anesthesia Quick Evaluation

## 2020-11-19 NOTE — Op Note (Signed)
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 Everett Graff   Assistant:  Earnstine Regal, PA-C  Findings: Fibroid uterus, cervix, bilateral fallopian tubes and bilateral ovaries.  Total weight 151g. No endometriotic lesions noted.  Pathology: 1.uterus and cervix 2.bilateral fallopian tubes  3.bilateral ovaries  Fluids: 2000 cc  UOP: 450 cc  EBL: 505 cc  Complications: None  Procedure:  The patient was taken to the operating room after the risks, benefits, alternatives, complications, treatment options, and expected outcomes were discussed with the patient. The patient verbalized understanding, the patient concurred with the proposed plan and consent signed and witnessed. The patient was taken to the Operating Room and identified as [employer] and the procedure verified as LAVH/BSO/Cystoscopy.  The patient was placed under general anesthesia per anesthesia staff, the patient was placed in modified dorsal lithotomy position and was prepped, draped, and catheterized in the normal, sterile fashion.  A Time Out was held and the above information confirmed.  The cervix was visualized and an intrauterine manipulator was placed.  A 10 mm umbilical incision was then performed. Veress needle was passed and pneumoperitoneum was established. A 10 mm trocar was advanced into the intraabdominal cavity, the laparoscope was introduced and findings as noted above.  Patient was placed in trendelenburg and marcaine injected in the LLQ and a 5 mm incision was made and 5 mm trocar advanced into the intraabdominal cavity.  The same was done in the RLQ and a 10 mm in the suprapubic area.   The Gyrus tripolar was used to excise the right fallopian tube in a  sequential fashion.  The fallopian tube was removed and sent to pathology.  The same was done on the contralateral side.  The tripolar was then used to cauterize and cut the utero-ovarian ligament down to the round ligament and bladder flap created on that side.  The same was done on the contralateral side.  The rt ovary had a cyst with appeared benign but was removed and cyst wall sent to pathology.  Attention was then turned to the perineum after covering the abdominal trocars.  The intrauterine manipulator was removed and weighted speculum placed.  The anterior lip of the cervix was grasped with a single tooth tenaculum and the cervix injected with dilute pitressin (20u in t 50cc NS).  The cervix was then circumscribed with the bovie and anterior cul-de-sac entered without difficulty.  The posterior cul-de-sac was then entered as well and uterosacral ligament on the right clamped with the heaney clamp, cut and suture ligated.  The same was done on the contralateral side.  Weighted speculum was then removed and long weighted speculum placed in the vagina and in the posterior cul-de-sac.  In a sequential fashion, the paracervical tissue and parametrial tissue was clamped cut and suture ligated on the right and then on the contralateral side until the uterine fundus was able to be flipped using a sweetheart tenaculum.  The remaining pedicle on the right was clamped cut and ligated and then suture ligated.  The same was done on the contralateral side and uterus and cervix removed.  A McCall culdoplasty stitch was placed and the cuff closed with figure of eight stitches of 0 vidryl in a horizontal fashion and then [employer] stitch tied.  Cystoscopy was performed and bilateral ureters were noted to efflux without difficulty and no inadvertent  bladder injuries were noted.  Attention was then returned to the abdomen after regowning and regloving.  The laparoscope was introduced at the umbilicus and inspection performed.   The abdomen was copiously irrigated.  There was small amount of oozing noted at the rt ovarian pedicle which was cauteries with the tripolar.  Good hemostasis at all pedicles and at the cuff were noted.  The suprapubic fascial incision was then repaired with 0 vicryl.  The right and left lower quadrant trocars were removed under direct visualization and pneumoperitoneum relieved.  The 78IO umbilical trocar was then removed under direct visualization and fascia repaired with 0 vicryl.  The two 44mm incisions were reapproximated with 3-0 monocryl and then dermabond applied to all four incisions.    Sponge, instrument, lap and needle counts were correct.  The patient tolerated the procedure well and was awaiting extubation and transfer to the recovery room in stable condition.

## 2020-11-20 DIAGNOSIS — D259 Leiomyoma of uterus, unspecified: Secondary | ICD-10-CM | POA: Diagnosis not present

## 2020-11-20 LAB — CBC
HCT: 34.3 % — ABNORMAL LOW (ref 36.0–46.0)
Hemoglobin: 10.8 g/dL — ABNORMAL LOW (ref 12.0–15.0)
MCH: 26.9 pg (ref 26.0–34.0)
MCHC: 31.5 g/dL (ref 30.0–36.0)
MCV: 85.3 fL (ref 80.0–100.0)
Platelets: 276 10*3/uL (ref 150–400)
RBC: 4.02 MIL/uL (ref 3.87–5.11)
RDW: 14.6 % (ref 11.5–15.5)
WBC: 8.4 10*3/uL (ref 4.0–10.5)
nRBC: 0 % (ref 0.0–0.2)

## 2020-11-20 LAB — BASIC METABOLIC PANEL
Anion gap: 7 (ref 5–15)
BUN: 8 mg/dL (ref 6–20)
CO2: 23 mmol/L (ref 22–32)
Calcium: 10 mg/dL (ref 8.9–10.3)
Chloride: 105 mmol/L (ref 98–111)
Creatinine, Ser: 0.8 mg/dL (ref 0.44–1.00)
GFR, Estimated: >60 mL/min (ref >60)
Glucose, Bld: 139 mg/dL — ABNORMAL HIGH (ref 70–99)
Potassium: 4.2 mmol/L (ref 3.5–5.1)
Sodium: 135 mmol/L (ref 135–145)

## 2020-11-20 MED ORDER — OXYCODONE HCL 5 MG PO TABS
ORAL_TABLET | ORAL | 0 refills | Status: DC
Start: 2020-11-20 — End: 2021-02-06

## 2020-11-20 MED ORDER — OXYCODONE HCL 5 MG PO TABS
ORAL_TABLET | ORAL | Status: AC
  Filled 2020-11-20: qty 2

## 2020-11-20 MED ORDER — KETOROLAC TROMETHAMINE 30 MG/ML IJ SOLN
INTRAMUSCULAR | Status: AC
  Filled 2020-11-20: qty 1

## 2020-11-20 MED ORDER — IBUPROFEN 600 MG PO TABS: ORAL_TABLET | ORAL | 1 refills | Status: DC

## 2020-11-20 MED ORDER — DOCUSATE SODIUM 100 MG PO CAPS
ORAL_CAPSULE | ORAL | Status: AC
  Filled 2020-11-20: qty 1

## 2020-11-20 NOTE — Discharge Summary (Signed)
Physician Discharge Summary  Patient ID: Ana Freeman MRN: [id number] DOB/AGE: 1971/05/03 50 y.o.  Admit date: 11/19/2020 Discharge date: 11/20/2020   Discharge Diagnoses:  Active Problems:   Fibroids   Menorrhagia   Operation: Laparoscopically Assisted Total Vaginal Hysterectomy, Bilateral Salpingo-oophorectomy, Lysis of Adhesions and Cystoscopy   Discharged Condition: Good  Hospital Course: On the date of admission the patient underwent the aforementioned procedures and tolerated them well.  Post operative course was unremarkable with the patient resuming bowel and bladder function by post operative day # 1 and was therefore deemed ready for discharge home.  Discharge hemoglobin was 10.8.  Disposition: Discharge disposition: 01-Home or Self Care       Discharge Medications:  Allergies as of 11/20/2020      Reactions   Chlorhexidine Gluconate Itching      Medication List    TAKE these medications   calcium carbonate 500 MG chewable tablet Commonly known as: TUMS - dosed in mg elemental calcium Chew 1 tablet by mouth daily as needed for indigestion or heartburn.   ibuprofen 600 MG tablet Commonly known as: ADVIL take 1 tablet po pc every 6 hours for 5 days then prn for post operative pain   MULTIVITAMIN GUMMIES ADULT PO Take 2 tablets by mouth daily.   omeprazole 40 MG capsule Commonly known as: PRILOSEC Take 40 mg by mouth daily as needed (acid reflux).   OVER THE COUNTER MEDICATION Take 1 tablet by mouth in the morning, at noon, and at bedtime. [place]oli Gummies   oxyCODONE 5 MG immediate release tablet Commonly known as: Roxicodone take 1 tablet po every 6 hours as needed for breakthrough post operative pain   valACYclovir 500 MG tablet Commonly known as: VALTREX Take 500 mg by mouth daily.          Follow-up: Dr. Harvie Bridge. Roberts in 6 weeks and Earnstine Regal, PA-C in 2 weeks   Signed: Earnstine Regal, PA-C 11/20/2020, 7:40 AM

## 2020-11-20 NOTE — Progress Notes (Signed)
Ana Freeman is a89 y.o.  [id number]  Post Op Date # LAVH/BSO/Cystoscopy  Subjective: Patient is Doing well postoperatively. Patient has Pain is controlled with current analgesics. Medications being used: prescription NSAID's including Ketorolac 30 mg IV and narcotic analgesics including Oxycodone. The patient is ambulating without difficulty, voiding, and tolerating a regular diet.   Objective: Vital signs in last 24 hours: Temp:  [97.9 F (36.6 C)-99.7 F (37.6 C)] 99.7 F (37.6 C) (12/09 0545) Pulse Rate:  [88-116] 88 (12/09 0545) Resp:  [14-22] 18 (12/09 0545) BP: (116-155)/(66-103) 116/66 (12/09 0545) SpO2:  [97 %-100 %] 99 % (12/09 0545) Weight:  [84.6 kg] 84.6 kg (12/08 1050)  Intake/Output from previous day: 12/08 0701 - 12/09 0700 In: 2414.2 [P.O.:60; I.V.:2254.2] Out: 2695 [Urine:2295] Intake/Output this shift: No intake/output data recorded. Recent Labs  Lab 11/14/20 1522 11/20/20 0525  WBC 3.7* 8.4  HGB 12.9 10.8*  HCT 41.3 34.3*  PLT 297 276     Recent Labs  Lab 11/14/20 1522 11/20/20 0525  NA 138 135  K 5.3* 4.2  CL 106 105  CO2 25 23  BUN 15 8  CREATININE 0.87 0.80  CALCIUM 11.4* 10.0  GLUCOSE 88 139*    EXAM: General: alert, cooperative and no distress Resp: clear to auscultation bilaterally Cardio: regular rate and rhythm, S1, S2 normal, no murmur, click, rub or gallop GI: bowel sounds present, soft and incisions are intact without evidence of infection. Extremities: Homans sign is negative, no sign of DVT and no calf tenderness.   Assessment: s/p Procedure(s): LAPAROSCOPIC ASSISTED VAGINAL HYSTERECTOMY WITH SALPINGO-OOPHARECTOMY CYSTOSCOPY: stable, progressing well, tolerating diet and anemia  Plan: Discharge home  LOS: 0 days    Earnstine Regal, PA-C 11/20/2020 7:29 AM

## 2020-11-20 NOTE — Progress Notes (Signed)
Patient got up to urinate and was able to urinate 150cc. Then patient ambulated in hallway and did very well. Once patient came back to room to vomited 100cc of clear fluid. Doctor was notified and new orders were given for IV phenergan. Also, patient stated that the percocet did not help her last time she took it and that oxycodone IR worked better. Doctor changed the percocet ordered to oxycodone IR.

## 2020-11-20 NOTE — Discharge Instructions (Signed)
Laparoscopically Assisted Vaginal Hysterectomy, Care After This sheet gives you information about how to care for yourself after your procedure. Your health care provider may also give you more specific instructions. If you have problems or questions, contact your health care provider. What can I expect after the procedure? After the procedure, it is common to have:  Soreness and numbness in your incision areas.  Abdominal pain. You will be given pain medicine to control it.  Vaginal bleeding and discharge. You will need to use a sanitary napkin after this procedure.  Sore throat from the breathing tube that was inserted during surgery. Follow these instructions at home: Medicines  Take over-the-counter and prescription medicines only as told by your health care provider.  Do not take aspirin or ibuprofen. These medicines can cause bleeding.  Do not drive or use heavy machinery while taking prescription pain medicine.  Do not drive for 24 hours if you were given a medicine to help you relax (sedative) during the procedure. Incision care   Follow instructions from your health care provider about how to take care of your incisions. Make sure you: ? Wash your hands with soap and water before you change your bandage (dressing). If soap and water are not available, use hand sanitizer. ? Change your dressing as told by your health care provider. ? Leave stitches (sutures), skin glue, or adhesive strips in place. These skin closures may need to stay in place for 2 weeks or longer. If adhesive strip edges start to loosen and curl up, you may trim the loose edges. Do not remove adhesive strips completely unless your health care provider tells you to do that.  Check your incision area every day for signs of infection. Check for: ? Redness, swelling, or pain. ? Fluid or blood. ? Warmth. ? Pus or a bad smell. Activity  Get regular exercise as told by your health care provider. You may be  told to take short walks every day and go farther each time.  Return to your normal activities as told by your health care provider. Ask your health care provider what activities are safe for you.  Do not douche, use tampons, or have sexual intercourse for at least 6 weeks, or until your health care provider gives you permission.  Do not lift anything that is heavier than 10 lb (4.5 kg), or the limit that your health care provider tells you, until he or she says that it is safe. General instructions  Do not take baths, swim, or use a hot tub until your health care provider approves. Take showers instead of baths.  Do not drive for 24 hours if you received a sedative.  Do not drive or operate heavy machinery while taking prescription pain medicine.  To prevent or treat constipation while you are taking prescription pain medicine, your health care provider may recommend that you: ? Drink enough fluid to keep your urine clear or pale yellow. ? Take over-the-counter or prescription medicines. ? Eat foods that are high in fiber, such as fresh fruits and vegetables, whole grains, and beans. ? Limit foods that are high in fat and processed sugars, such as fried and sweet foods.  Keep all follow-up visits as told by your health care provider. This is important. Contact a health care provider if:  You have signs of infection, such as: ? Redness, swelling, or pain around your incision sites. ? Fluid or blood coming from an incision. ? An incision that feels warm to the   touch. ? Pus or a bad smell coming from an incision.  Your incision breaks open.  Your pain medicine is not helping.  You feel dizzy or light-headed.  You have pain or bleeding when you urinate.  You have persistent nausea and vomiting.  You have blood, pus, or a bad-smelling discharge from your vagina. Get help right away if:  You have a fever.  You have severe abdominal pain.  You have chest pain.  You have  shortness of breath.  You faint.  You have pain, swelling, or redness in your leg.  You have heavy bleeding from your vagina. Summary  After the procedure, it is common to have abdominal pain and vaginal bleeding.  You should not drive or lift heavy objects until your health care provider says that it is safe.  Contact your health care provider if you have any symptoms of infection, excessive vaginal bleeding, nausea, vomiting, or shortness of breath. This information is not intended to replace advice given to you by your health care provider. Make sure you discuss any questions you have with your health care provider. Document Revised: 11/11/2017 Document Reviewed: 01/25/2017 Elsevier Patient Education  2020 [employer]. Call [facility] @ [phone] if:  You have a temperature greater than or equal to 100.4 degrees Farenheit orally You have pain that is not made better by the pain medication given and taken as directed You have excessive bleeding or problems urinating  Take Colace (Docusate Sodium/Stool Softener) 100 mg 2-3 times daily while taking narcotic pain medicine to avoid constipation or until bowel movements are regular. Take, with food, Ibuprofen 600 mg every 6 hours for 5 days then as needed for pain   Take any over the counter iron supplement of your choice every other day  You may drive after 2 weeks You may walk up steps  You may shower  You may resume a regular diet  Keep incisions clean and dry Do not lift over 15 pounds for 6 weeks Avoid anything in vagina for 6 weeks (or until after your post-operative visit)

## 2020-11-20 NOTE — Anesthesia Postprocedure Evaluation (Signed)
Anesthesia Post Note  Patient: Ana Freeman  Procedure(s) Performed: LAPAROSCOPIC ASSISTED VAGINAL HYSTERECTOMY WITH SALPINGO-OOPHARECTOMY (Bilateral Abdomen) CYSTOSCOPY (N/A Bladder)     Patient location during evaluation: PACU Anesthesia Type: General Level of consciousness: awake and alert Pain management: pain level controlled Vital Signs Assessment: post-procedure vital signs reviewed and stable Respiratory status: spontaneous breathing, nonlabored ventilation and respiratory function stable Cardiovascular status: blood pressure returned to baseline and stable Postop Assessment: no apparent nausea or vomiting Anesthetic complications: no   No complications documented.  Last Vitals:  Vitals:   11/20/20 0545 11/20/20 0815  BP: 116/66 (!) 117/59  Pulse: 88 100  Resp: 18 18  Temp: 37.6 C 37.4 C  SpO2: 99% 98%    Last Pain:  Vitals:   11/20/20 0815  TempSrc:   PainSc: 3                  Lidia Collum

## 2020-11-21 ENCOUNTER — Encounter (HOSPITAL_BASED_OUTPATIENT_CLINIC_OR_DEPARTMENT_OTHER): Payer: Self-pay | Admitting: Obstetrics and Gynecology

## 2020-11-21 ENCOUNTER — Other Ambulatory Visit: Payer: Self-pay | Admitting: Family Medicine

## 2020-11-21 LAB — SURGICAL PATHOLOGY

## 2020-11-21 MED ORDER — VALACYCLOVIR HCL 500 MG PO TABS: 500.0000 mg | ORAL_TABLET | Freq: Every day | ORAL | 0 refills | Status: DC

## 2020-12-11 ENCOUNTER — Encounter: Payer: Self-pay | Admitting: Family Medicine

## 2020-12-30 ENCOUNTER — Ambulatory Visit: Payer: BC Managed Care – PPO | Admitting: Family Medicine

## 2021-01-03 IMAGING — US US FNA BIOPSY THYROID 1ST LESION
1 series · 13 of 25 positions shown · non-contrast
Comparison: Thyroid ultrasound dated 05/29/2020

INDICATION: Patient with history of thyroid goiter, hyperparathyroidism and left
inferior parathyroid adenoma. Thyroid ultrasound on 05/29/2020
revealed 2.5 cm right mid and 3 cm left mid nodules which meet
criteria for biopsy. She presents today for the procedure.

EXAM:
ULTRASOUND GUIDED FINE NEEDLE ASPIRATION BIOPSIES OF RIGHT MID AND
LEFT MID THYROID NODULES
TECHNIQUE: Informed written consent was obtained from the patient after a
discussion of the risks, benefits and alternatives to treatment.
Questions regarding the procedure were encouraged and answered. A
timeout was performed prior to the initiation of the procedure.

[Series 1: us fna biopsy thyroid 1st lesion · 0.06mm/px · 37 acquisitions, 13 frames shown]
[im 1/37]
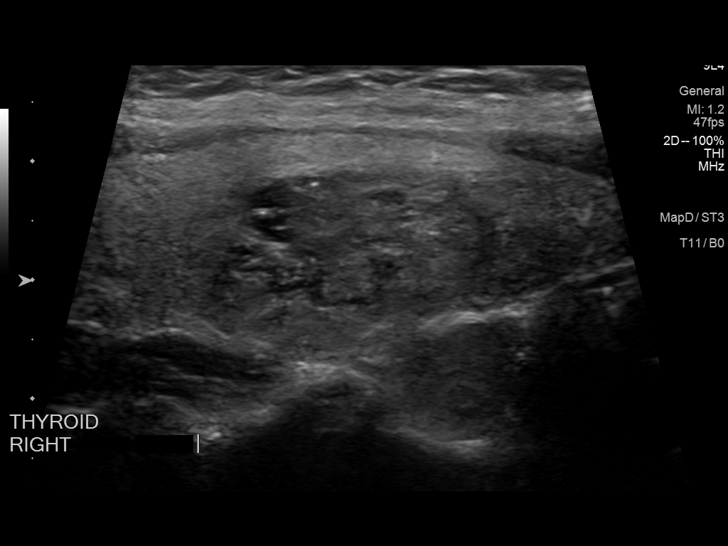
[im 4/37]
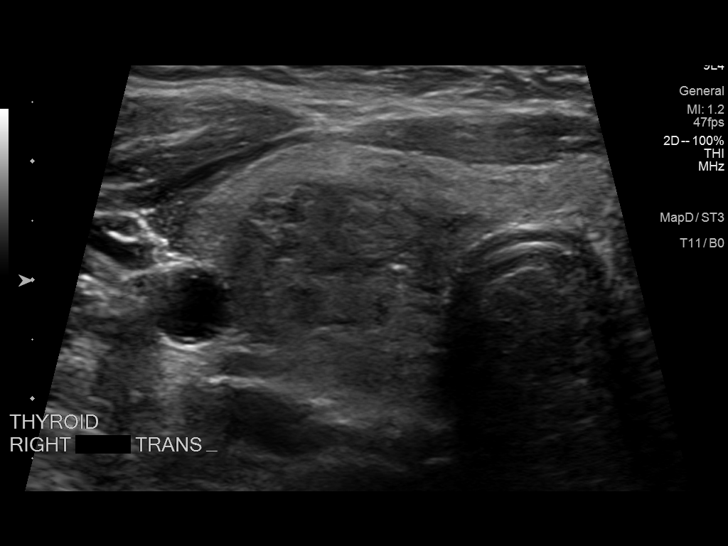
[im 7/37]
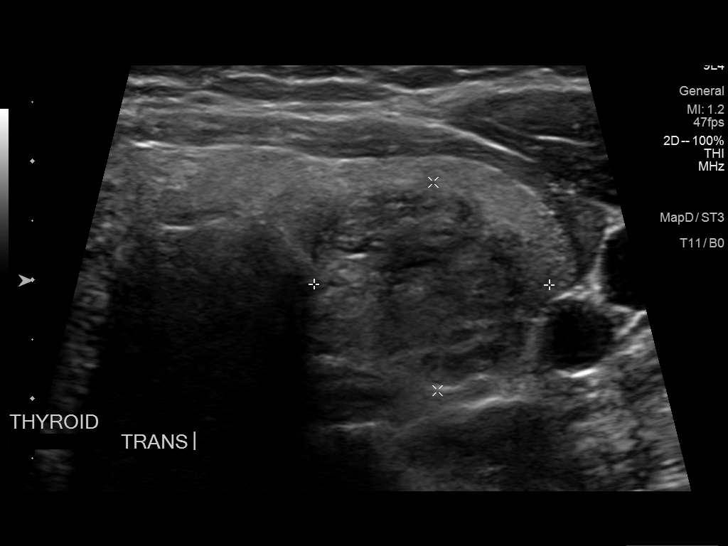
[im 10/37]
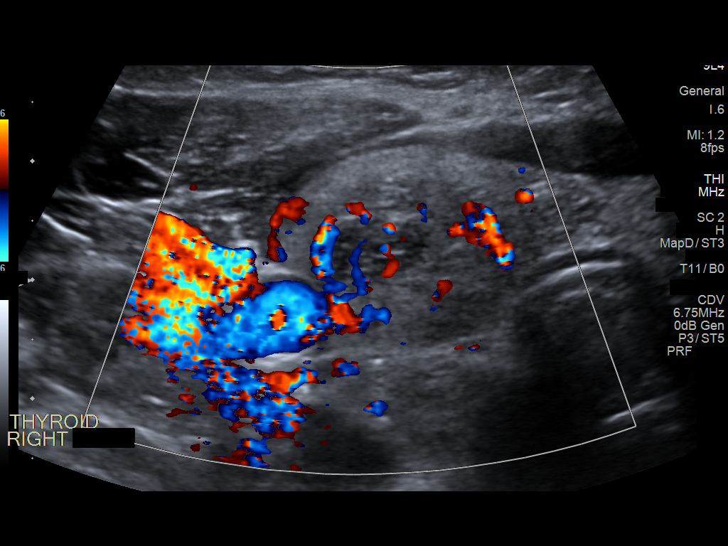
[im 13/37]
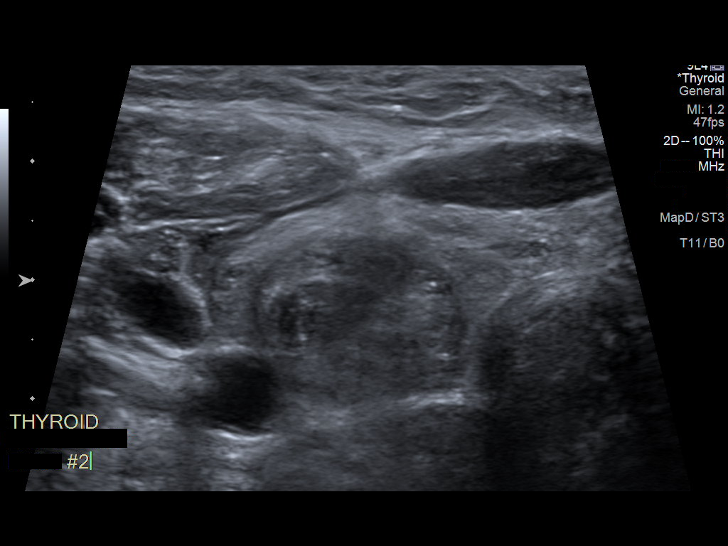
[im 16/37]
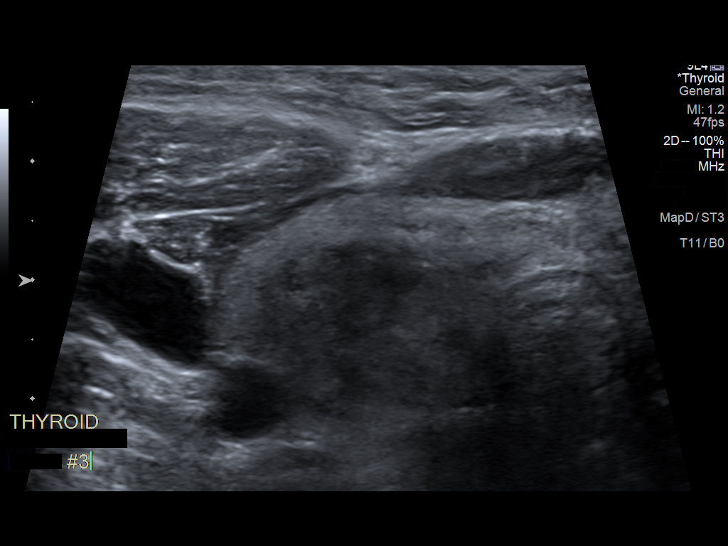
[im 19/37]
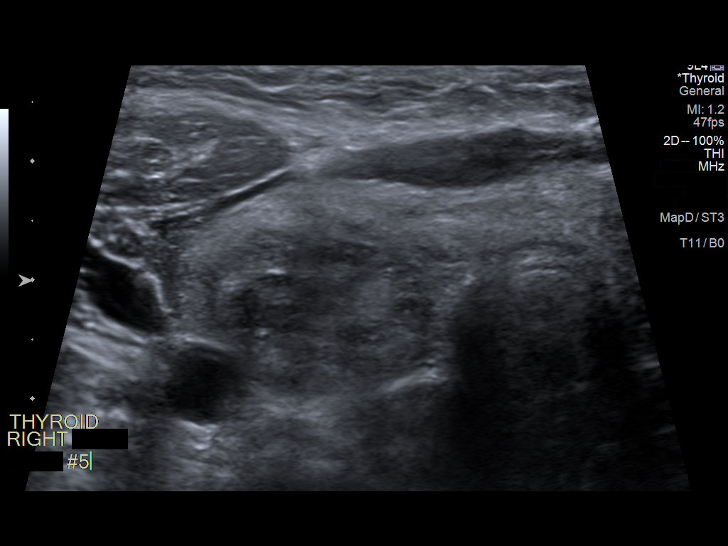
[im 22/37]
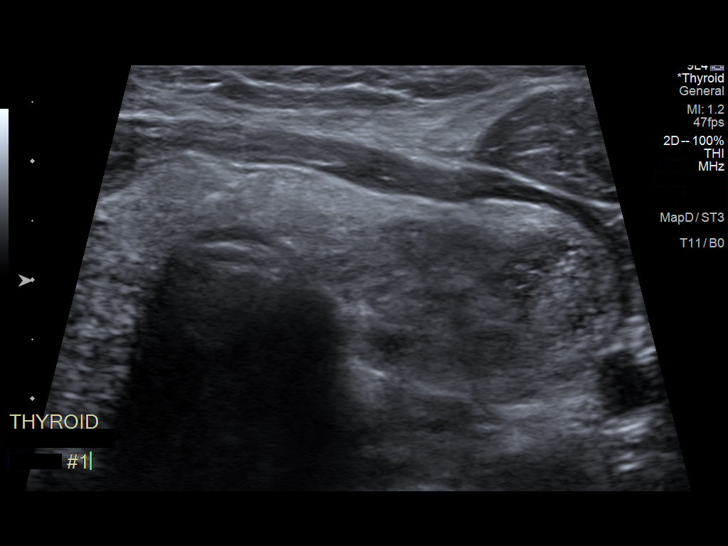
[im 25/37]
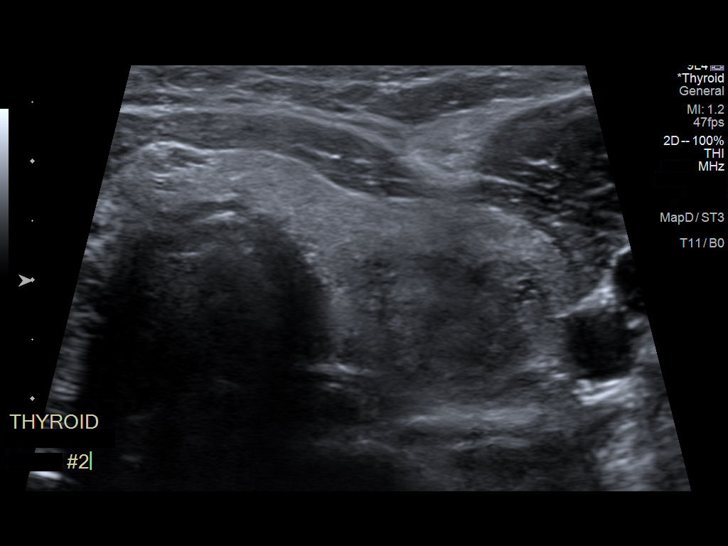
[im 28/37]
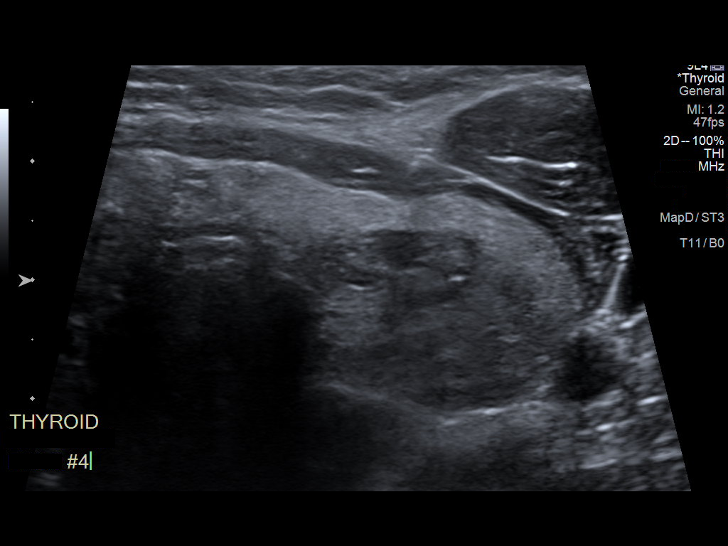
[im 31/37]
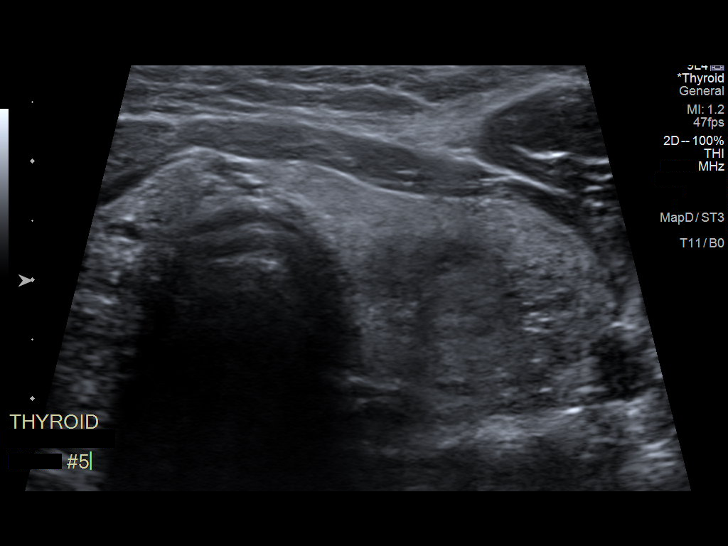
[im 34/37]
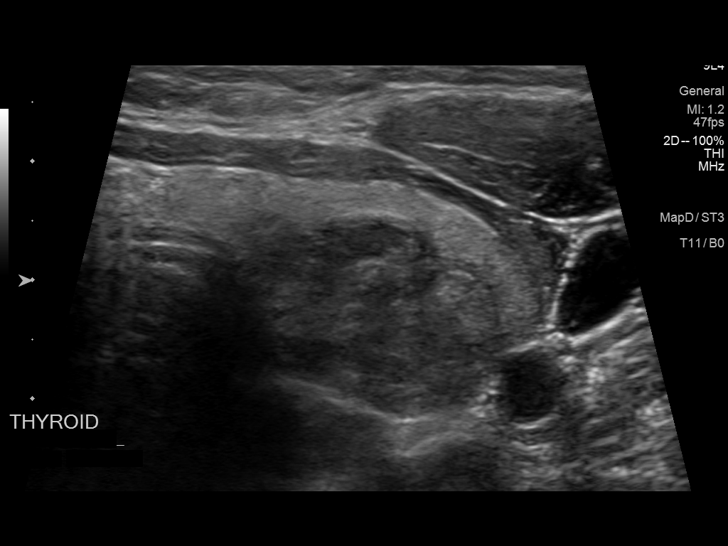
[im 37/37]
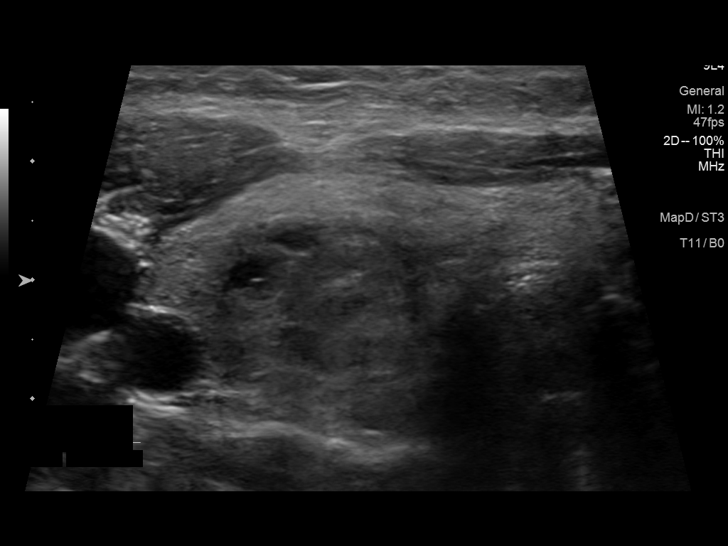

[13 of 25 positions shown; findings below may reference images not displayed]

MEDICATIONS:
1% lidocaine to skin and subcutaneous tissue

COMPLICATIONS:
None immediate.
Pre-procedural ultrasound scanning demonstrated unchanged size and
appearance of the indeterminate nodules within the right mid and
left mid thyroid lobes

The procedure was planned. The neck was prepped in the usual sterile
fashion, and a sterile drape was applied covering the operative
field. A timeout was performed prior to the initiation of the
procedure. Local anesthesia was provided with 1% lidocaine.

Under direct ultrasound guidance, 6 FNA biopsies were performed of
the right mid thyroid nodule with 25 gauge needles. Multiple
ultrasound images were saved for procedural documentation purposes.
The samples were prepared and submitted to pathology as well as for
Afirma testing.

Under direct ultrasound guidance, 6 FNA biopsies were performed of
the left mid thyroid nodule with 25 gauge needles. Multiple
ultrasound images were saved for procedural documentation purposes.
The samples were prepared and submitted to pathology as well as for
Afirma testing.

Limited post procedural scanning was negative for hematoma or
additional complication. Dressings were placed. The patient
tolerated the above procedures procedure well without immediate
postprocedural complication.
FINDINGS: Nodule reference number based on prior diagnostic ultrasound: 1

Maximum size: 2.5 cm

Location: Right; Mid

ACR TI-RADS risk category: TR4 (4-6 points)

Reason for biopsy: meets ACR TI-RADS criteria

_________________________________________________________

Nodule reference number based on prior diagnostic ultrasound: 5

Maximum size: 3.0 cm

Location: Left; Mid

ACR TI-RADS risk category: TR4 (4-6 points)

Reason for biopsy: meets ACR TI-RADS criteria

Ultrasound imaging confirms appropriate placement of the needles
within the thyroid nodules.
IMPRESSION: 1. Technically successful ultrasound guided fine needle aspiration
biopsy of right mid thyroid nodule
2. Technically successful ultrasound guided fine needle aspiration
biopsy of left mid thyroid nodule. Final pathology pending.

## 2021-02-03 ENCOUNTER — Telehealth: Payer: Self-pay | Admitting: Cardiovascular Disease

## 2021-02-03 NOTE — Telephone Encounter (Signed)
    Pt c/o of Chest Pain: STAT if CP now or developed within 24 hours  1. Are you having CP right now? No   2. Are you experiencing any other symptoms (ex. SOB, nausea, vomiting, sweating)? SOB, nausea  3. How long have you been experiencing CP? Last night and this morning   4. Is your CP continuous or coming and going? Last night continuous   5. Have you taken Nitroglycerin? No, don't have one    Pt said she's been having left arm pain but last night when felt the worst chest pain she ever had. She also got another one this morning, she said she felt her jaw tighten and felt nauseous and her HR is elevated, normally her resting HR is around 80 but today it's at 108, she is not sure if this is anxiety but she's not sure.

## 2021-02-03 NOTE — Telephone Encounter (Signed)
Received a call from patient.She stated she had a episode last night pain in center of chest,nausea,dizzy.Pain in left arm.Stated she thought she was having acid reflux.She took 3 tums with no relief.She has been having palpitations.Today she just feels anxious.No chest pain.Appointment scheduled with Coletta Memos NP 2/25 at 9:15 am.Advised if she has any more chest pain go to ED.

## 2021-02-05 NOTE — Progress Notes (Signed)
Cardiology Clinic Note   Patient Name: Ana Freeman Date of Encounter: 02/06/2021  Primary Care Provider:  Lesleigh Noe, MD Primary Cardiologist:  Skeet Latch, MD  Patient Profile    Ana Freeman 50 year old female presents the clinic today for an evaluation of her palpitations and chest pain.  Past Medical History    Past Medical History:  Diagnosis Date  . Anemia   . Brain lesion   . Closed compression fracture of body of L1 vertebra ([place]) 07/06/2019  . GERD (gastroesophageal reflux disease)   . Hyperlipidemia 07/19/2018  . Nondisplaced fracture of posterior process of right talus 07/09/2019  . Numbness of lower extremity   . Parathyroid abnormality [facility])    Past Surgical History:  Procedure Laterality Date  . ANKLE CLOSED REDUCTION Right 07/05/2019   Procedure: Closed Reduction Ankle;  Surgeon: Hiram Gash, MD;  Location: [place];  Service: Orthopedics;  Laterality: Right;  . COLONOSCOPY     pt denies   . CYSTOSCOPY N/A 11/19/2020   Procedure: CYSTOSCOPY;  Surgeon: Everett Graff, MD;  Location: [facility];  Service: Gynecology;  Laterality: N/A;  . DILATATION & CURETTAGE/HYSTEROSCOPY WITH MYOSURE N/A 10/24/2019   Procedure: DILATATION & CURETTAGE/HYSTEROSCOPY WITH MYOSURE;  Surgeon: Waymon Amato, MD;  Location: [place];  Service: Gynecology;  Laterality: N/A;  . DILATION AND EVACUATION N/A 02/12/2016   Procedure: DILATATION AND EVACUATION WITH SUCTION ;  Surgeon: Eldred Manges, MD;  Location: [facility];  Service: Gynecology;  Laterality: N/A;  . EXTERNAL FIXATION LEG Left 07/05/2019   Procedure: EXTERNAL FIXATION LEFT ANKLE;  Surgeon: Hiram Gash, MD;  Location: [place];  Service: Orthopedics;  Laterality: Left;  . EXTERNAL FIXATION REMOVAL Left 07/19/2019   Procedure: REMOVAL EXTERNAL FIXATION LEG;  Surgeon: Shona Needles, MD;  Location: [place];  Service: Orthopedics;  Laterality: Left;  . HYSTEROSCOPY WITH RESECTOSCOPE N/A  02/12/2016   Procedure: HYSTEROSCOPIC MYOMECTOMY WITH RESECTOSCOPE;  Surgeon: Eldred Manges, MD;  Location: [facility];  Service: Gynecology;  Laterality: N/A;  . I & D EXTREMITY Left 07/05/2019   Procedure: IRRIGATION AND DEBRIDEMENT left ankle ;  Surgeon: Hiram Gash, MD;  Location: [place];  Service: Orthopedics;  Laterality: Left;  . I & D EXTREMITY Left 07/08/2019   Procedure: IRRIGATION AND DEBRIDEMENT EXTREMITY WITH ADJUSTMENT OF EXTERNAL FIXATOR;  Surgeon: Shona Needles, MD;  Location: [place];  Service: Orthopedics;  Laterality: Left;  . LAPAROSCOPIC VAGINAL HYSTERECTOMY WITH SALPINGECTOMY Bilateral 11/19/2020   Procedure: LAPAROSCOPIC ASSISTED VAGINAL HYSTERECTOMY WITH SALPINGO-OOPHARECTOMY;  Surgeon: Everett Graff, MD;  Location[facility];  Service: Gynecology;  Laterality: Bilateral;  . MASS EXCISION Right 08/30/2017   Procedure: EXCISION RIGHT UPPER ARM MASS;  Surgeon: Coralie Keens, MD;  Location: [place];  Service: General;  Laterality: Right;  . MYOMECTOMY N/A 02/12/2016   Procedure: ABDOMINAL MYOMECTOMY;  Surgeon: Eldred Manges, MD;  Location: [facility];  Service: Gynecology;  Laterality: N/A;  . OPEN REDUCTION INTERNAL FIXATION (ORIF) TIBIA/FIBULA FRACTURE Left 07/19/2019   Procedure: OPEN REDUCTION INTERNAL FIXATION (ORIF) LEFT PILON;  Surgeon: Shona Needles, MD;  Location: [place];  Service: Orthopedics;  Laterality: Left;  . ORIF ANKLE FRACTURE Right 07/08/2019   Procedure: OPEN REDUCTION INTERNAL FIXATION (ORIF) ANKLE FRACTURE;  Surgeon: Shona Needles, MD;  Location: [place];  Service: Orthopedics;  Laterality: Right;  . UPPER GI ENDOSCOPY    . WISDOM TOOTH EXTRACTION  Allergies  Allergies  Allergen Reactions  . Chlorhexidine Gluconate Itching    History of Present Illness    [name] has a PMH of GERD, alopecia, iron deficiency anemia, hyperlipidemia, obesity, and atypical chest pain.  She was initially seen 8/19 for  evaluation of chest pain.  She presented to the emergency department 3/19 with chest pain and had been experiencing intermittent periods of pain for years.  She was noted to first have pain in 2015.  She reported that her episodes would occur for almost 5 days a week.  She reported chest tightness but also had numbness and tingling that radiated down her left arm and leg.  It was not associated with shortness of breath, nausea, or diaphoresis.  She would note the symptoms almost every time she would exercise and especially in Zumba class.  She also reported symptoms would occur sporadically.  She was seen and evaluated by Dr. Einar Gip in 2016.  She underwent exercise Myoview that was negative for ischemia.  Her echocardiogram is reported to have been normal.  She was referred for coronary CTA 9/19 which showed a calcium score of 0 no coronary artery disease.  She was seen by Dr. Oval Linsey 10/05/2018.  During that time she was doing well.  She had no symptoms of recurrent chest pain.  She was taking Nexium which was helping her symptoms.  She denied left arm symptoms.  She reported she was running on the treadmill and noticed some numbness in her left leg.  She also reported stabbing pain in her feet when laying down.  She denied calf discomfort.  She denied lower extremity swelling, orthopnea, and PND.  She denied lightheadedness and dizziness.  She continued to exercise fairly regularly.  She contacted nurse triage line on 02/03/2021 and reported that she was having pain in the center of her chest, nausea, and dizziness.  She also reported pain in her left arm.  She felt like the symptoms may be related to acid reflux.  She took 3 Tums and had no relief.  She was also having heart palpitations.  At the time of the call she reported she felt anxious and denied chest pain.  She presents the clinic today for further evaluation states she noticed some left arm pain, jaw pain and substernal chest pain surrounding a  presentation that she was giving.  She reports that the symptoms subsided after she presented.  We reviewed her coronary CTA and I provided reassurance.  She reported that she also has noticed several episodes of occasional heart palpitations.  She reports that her resting heart rate recently has been over 100.  She was recently at her GYN evaluate her thyroid which she reports was normal.  I have asked her to avoid triggers caffeine, chocolate, EtOH, dehydration and will order a 14-day ZIO monitor.  She denies bleeding issues.  We will follow up with her in 8 weeks.  Today she denies chest pain, shortness of breath, lower extremity edema, fatigue, palpitations, melena, hematuria, hemoptysis, diaphoresis, weakness, presyncope, syncope, orthopnea, and PND.   Home Medications    Prior to Admission medications   Medication Sig Start Date End Date Taking? Authorizing Provider  calcium carbonate (TUMS - DOSED IN MG ELEMENTAL CALCIUM) 500 MG chewable tablet Chew 1 tablet by mouth daily as needed for indigestion or heartburn.     [provider]  ibuprofen (ADVIL) 600 MG tablet take 1 tablet po pc every 6 hours for 5 days then prn for post  operative pain 11/20/20   Earnstine Regal, PA-C  Multiple Vitamins-Minerals (MULTIVITAMIN GUMMIES ADULT PO) Take 2 tablets by mouth daily.    [provider]  omeprazole (PRILOSEC) 40 MG capsule Take 40 mg by mouth daily as needed (acid reflux).  08/22/20   [provider]  OVER THE COUNTER MEDICATION Take 1 tablet by mouth in the morning, at noon, and at bedtime. [place] Gummies    [provider]  oxyCODONE (ROXICODONE) 5 MG immediate release tablet take 1 tablet po every 6 hours as needed for breakthrough post operative pain 11/20/20   Earnstine Regal, PA-C  valACYclovir (VALTREX) 500 MG tablet Take 1 tablet (500 mg total) by mouth daily. 11/21/20   Elby Beck, FNP    Family History    Family History  Problem Relation Age of  Onset  . Hypertension Mother   . Diabetes Father   . Congenital heart disease Father   . Anemia Sister   . Lung cancer Maternal Grandmother   . Ovarian cancer Paternal Grandmother   . Alzheimer's disease Paternal Grandfather    She indicated that her mother is alive. She indicated that her father is alive. She indicated that her sister is alive. She indicated that her maternal grandmother is deceased. She indicated that her maternal grandfather is deceased. She indicated that her paternal grandmother is deceased. She indicated that her paternal grandfather is alive.  Social History    Social History   Socioeconomic History  . Marital status: Married    Spouse name: Shawn  . Number of children: 0  . Years of education: Masters  . Highest education level: Not on file  Occupational History  . Not on file  Tobacco Use  . Smoking status: Never Smoker  . Smokeless tobacco: Never Used  Vaping Use  . Vaping Use: Never used  Substance and Sexual Activity  . Alcohol use: Yes    Alcohol/week: 0.0 standard drinks    Comment: 5 times a year  . Drug use: Never  . Sexual activity: Yes    Birth control/protection: I.U.D.  Other Topics Concern  . Not on file  Social History Narrative   10/28/20   From: [place] originally, moved in [place]: with husband, Shawn (2021)   Work: [employer] at [employer]       Family: mom is Solicitor, aunt, sister, cousin nearby too      Enjoys: dance, Probation officer, eating      Exercise: 5 days a week - youtube dance video, [employer] weight lifting    Diet: eat anything, tries to eat healthy - on weight watchers       Safety   Seat belts: Yes    Guns: No   Safe in relationships: Yes    Social Determinants of Radio broadcast assistant Strain: Not on file  Food Insecurity: Not on file  Transportation Needs: Not on file  Physical Activity: Not on file  Stress: Not on file  Social Connections: Not on file  Intimate Partner Violence: Not on  file     Review of Systems    General:  No chills, fever, night sweats or weight changes.  Cardiovascular:  No chest pain, dyspnea on exertion, edema, orthopnea, palpitations, paroxysmal nocturnal dyspnea. Dermatological: No rash, lesions/masses Respiratory: No cough, dyspnea Urologic: No hematuria, dysuria Abdominal:   No nausea, vomiting, diarrhea, bright red blood per rectum, melena, or hematemesis Neurologic:  No visual changes, wkns, changes in mental status. All  other systems reviewed and are otherwise negative except as noted above.  Physical Exam    VS:  BP 122/78 (BP Location: Left Arm, Patient Position: Sitting, Cuff Size: Normal)   Pulse 63   Wt 183 lb 3.2 oz (83.1 kg)   BMI 31.45 kg/m  , BMI Body mass index is 31.45 kg/m. GEN: Well nourished, well developed, in no acute distress. HEENT: normal. Neck: Supple, no JVD, carotid bruits, or masses. Cardiac: RRR, no murmurs, rubs, or gallops. No clubbing, cyanosis, edema.  Radials/DP/PT 2+ and equal bilaterally.  Respiratory:  Respirations regular and unlabored, clear to auscultation bilaterally. GI: Soft, nontender, nondistended, BS + x 4. MS: no deformity or atrophy. Skin: warm and dry, no rash. Neuro:  Strength and sensation are intact. Psych: Normal affect.  Accessory Clinical Findings    Recent Labs: 10/28/2020: ALT 19 11/20/2020: BUN 8; Creatinine, Ser 0.80; Hemoglobin 10.8; Platelets 276; Potassium 4.2; Sodium 135   Recent Lipid Panel    Component Value Date/Time   CHOL 169 10/28/2020 1628   CHOL 217 (H) 10/05/2018 0848   TRIG 60.0 10/28/2020 1628   HDL 42.10 10/28/2020 1628   HDL 68 10/05/2018 0848   CHOLHDL 4 10/28/2020 1628   VLDL 12.0 10/28/2020 1628   LDLCALC 115 (H) 10/28/2020 1628   LDLCALC 138 (H) 10/05/2018 0848    ECG personally reviewed by me today-normal sinus rhythm nonspecific T wave abnormality 73 bpm- No acute changes  EKG 07/19/2018 Normal sinus rhythm with sinus arrhythmia,  nonspecific T wave abnormality 71 bpm   Nuclear stress test 03/14/2015 Negative for ischemia  Coronary CTA 09/04/2018 IMPRESSION: 1. Normal right dominant coronary arteries  2.  Calcium score 0  3.  Normal aortic root 2.9 cm  [place]  Assessment & Plan   1.  Palpitations-no palpitations today.  Has noticed intermittent periods of palpitations.  She reports a much faster resting heart rate over 100 at times.  Reports that she recently had her thyroid evaluated by her GYN and was normal. Avoid triggers caffeine, chocolate, EtOH, dehydration etc. Order 14-day ZIO monitor  Atypical chest pain-no chest pain today.  EKG shows normal sinus rhythm nonspecific T wave abnormality 73 bpm contacted nurse triage line on 02/03/2021.  She reported that she was having pain in the center of her chest, nausea, and dizziness.  She also reported pain in her left arm.  She felt like the symptoms may be related to acid reflux.  She took 3 Tums and had no relief.  She was also having heart palpitations.  At the time of the call she reported she felt anxious and denied chest pain.  Appears to be related to reflux and anxiety. Heart healthy low-sodium diet Increase physical activity as tolerated No further ischemic work-up planned at this time. Reassured it was not related to cardiac issues.  Hyperlipidemia-10/28/2020: Cholesterol 169; HDL 42.10; LDL Cholesterol 115; Triglycerides 60.0; VLDL 12.0 Heart healthy low-sodium high-fiber diet Increase physical activity as tolerated  GERD-reports compliance with omeprazole. Continue omeprazole, Tums GERD diet information given Increase physical activity as tolerated  Left arm/leg tingling-chronic previously recommended follow-up with neurology. Follows with PCP  Disposition: Follow-up with Dr. Oval Linsey in 8 weeks.   Jossie Ng. Haygen Zebrowski NP-C    02/06/2021, 9:36 AM [address] Office [phone] Fax  [phone]  Notice: This dictation was prepared with Dragon dictation along with smaller phrase technology. Any transcriptional errors that result from this process are unintentional and  may not be corrected upon review.  I spent 12 minutes examining this patient, reviewing medications, and using patient centered shared decision making involving her cardiac care.  Prior to her visit I spent greater than 20 minutes reviewing her past medical history,  medications, and prior cardiac tests.

## 2021-02-06 ENCOUNTER — Other Ambulatory Visit: Payer: Self-pay | Admitting: General Practice

## 2021-02-06 ENCOUNTER — Encounter: Payer: Self-pay | Admitting: General Practice

## 2021-02-06 ENCOUNTER — Ambulatory Visit (INDEPENDENT_AMBULATORY_CARE_PROVIDER_SITE_OTHER): Payer: BC Managed Care – PPO

## 2021-02-06 ENCOUNTER — Encounter: Payer: Self-pay | Admitting: Radiology

## 2021-02-06 ENCOUNTER — Ambulatory Visit (INDEPENDENT_AMBULATORY_CARE_PROVIDER_SITE_OTHER): Payer: Self-pay | Admitting: General Practice

## 2021-02-06 ENCOUNTER — Other Ambulatory Visit: Payer: Self-pay

## 2021-02-06 VITALS — BP 122/78 | HR 63 | Wt 183.2 lb

## 2021-02-06 DIAGNOSIS — R002 Palpitations: Secondary | ICD-10-CM

## 2021-02-06 DIAGNOSIS — R200 Anesthesia of skin: Secondary | ICD-10-CM

## 2021-02-06 DIAGNOSIS — R0789 Other chest pain: Secondary | ICD-10-CM

## 2021-02-06 DIAGNOSIS — E7800 Pure hypercholesterolemia, unspecified: Secondary | ICD-10-CM

## 2021-02-06 DIAGNOSIS — K219 Gastro-esophageal reflux disease without esophagitis: Secondary | ICD-10-CM

## 2021-02-06 NOTE — Patient Instructions (Signed)
Medication Instructions:  The current medical regimen is effective;  continue present plan and medications as directed. Please refer to the Current Medication list given to you today.  *If you need a refill on your cardiac medications before your next appointment, please call your pharmacy*  Lab Work: NONE  Testing/Procedures: ZIO MONITOR, SEE ATTACHED  Special Instructions Please try to avoid these triggers:  Do not use any products that have nicotine or tobacco in them. These include cigarettes, e-cigarettes, and chewing tobacco. If you need help quitting, ask your doctor.  Eat heart-healthy foods. Talk with your doctor about the right eating plan for you.  Exercise regularly as told by your doctor.  Stay hydrated  Do not drink alcohol, Caffeine or chocolate.  Lose weight if you are overweight.  Do not use drugs, including cannabis   MAINTAIN PHYSICAL ACTIVITY-AS TOLERATED  Follow-Up: Your next appointment:  8 week(s) In Person with Skeet Latch, MD OR IF UNAVAILABLE [place], FNP-C  At [facility], you and your health needs are our priority.  As part of our continuing mission to provide you with exceptional heart care, we have created designated Provider Care Teams.  These Care Teams include your primary Cardiologist (physician) and Advanced Practice Providers (APPs -  Physician Assistants and Nurse Practitioners) who all work together to provide you with the care you need, when you need it.   ZIO XT- Long Term Monitor Instructions   Your physician has requested you wear your ZIO patch monitor_______days.   This is a single patch monitor.  [employer] supplies one patch monitor per enrollment.  Additional stickers are not available.   Please do not apply patch if you will be having a Nuclear Stress Test, Echocardiogram, Cardiac CT, MRI, or Chest Xray during the time frame you would be wearing the monitor. The patch cannot be worn during these tests.  You cannot  remove and re-apply the ZIO XT patch monitor.   Your ZIO patch monitor will be sent USPS Priority mail from [facility] directly to your home address. The monitor may also be mailed to a PO BOX if home delivery is not available.   It may take 3-5 days to receive your monitor after you have been enrolled.   Once you have received you monitor, please review enclosed instructions.  Your monitor has already been registered assigning a specific monitor serial # to you.   Applying the monitor   Shave hair from upper left chest.   Hold abrader disc by orange tab.  Rub abrader in 40 strokes over left upper chest as indicated in your monitor instructions.   Clean area with 4 enclosed alcohol pads .  Use all pads to assure are is cleaned thoroughly.  Let dry.   Apply patch as indicated in monitor instructions.  Patch will be place under collarbone on left side of chest with arrow pointing upward.   Rub patch adhesive wings for 2 minutes.Remove white label marked "1".  Remove white label marked "2".  Rub patch adhesive wings for 2 additional minutes.   While looking in a mirror, press and release button in center of patch.  A small green light will flash 3-4 times .  This will be your only indicator the monitor has been turned on.     Do not shower for the first 24 hours.  You may shower after the first 24 hours.   Press button if you feel a symptom. You will hear a small click.  Record  Date, Time and Symptom in the Patient Log Book.   When you are ready to remove patch, follow instructions on last 2 pages of Patient Log Book.  Stick patch monitor onto last page of Patient Log Book.   Place Patient Log Book in [place] box.  Use locking tab on box and tape box closed securely.  The [employer] and [employer] has [employer] on it.  Please place in mailbox as soon as possible.  Your physician should have your test results approximately 7 days after the monitor has been mailed back to [facility].    Call Time at [phone] if you have questions regarding your ZIO XT patch monitor.  Call them immediately if you see an orange light blinking on your monitor.   If your monitor falls off in less than 4 days contact our Monitor department at [phone].  If your monitor becomes loose or falls off after 4 days call [employer] at [phone] for suggestions on securing your monitor.

## 2021-02-06 NOTE — Progress Notes (Signed)
Enrolled patient for a 14 day Zio XT Monitor to be mailed to patients home  

## 2021-02-12 ENCOUNTER — Telehealth: Payer: Self-pay | Admitting: *Deleted

## 2021-02-12 NOTE — Telephone Encounter (Signed)
LMVM Please call Matias Thurman at [facility]. ( address verification needed in order to ship monitor / we received message from [facility] patients address is invalid is USPS address verification system.  Is it possibly [address], [phone]?)

## 2021-02-15 DIAGNOSIS — R002 Palpitations: Secondary | ICD-10-CM | POA: Diagnosis not present

## 2021-04-06 NOTE — Progress Notes (Deleted)
Cardiology Clinic Note   Patient Name: Ana Freeman Date of Encounter: 04/06/2021  Primary Care Provider:  Lesleigh Noe, MD Primary Cardiologist:  Skeet Latch, MD  Patient Profile    Ana Freeman 50 year old female presents the clinic today for a follow-up evaluation of her palpitations and chest pain.  Past Medical History    Past Medical History:  Diagnosis Date  . Anemia   . Brain lesion   . Closed compression fracture of body of L1 vertebra ([place]) 07/06/2019  . GERD (gastroesophageal reflux disease)   . Hyperlipidemia 07/19/2018  . Nondisplaced fracture of posterior process of right talus 07/09/2019  . Numbness of lower extremity   . Parathyroid abnormality [facility])    Past Surgical History:  Procedure Laterality Date  . ANKLE CLOSED REDUCTION Right 07/05/2019   Procedure: Closed Reduction Ankle;  Surgeon: Hiram Gash, MD;  Location: [place];  Service: Orthopedics;  Laterality: Right;  . COLONOSCOPY     pt denies   . CYSTOSCOPY N/A 11/19/2020   Procedure: CYSTOSCOPY;  Surgeon: Everett Graff, MD;  Location: [facility];  Service: Gynecology;  Laterality: N/A;  . DILATATION & CURETTAGE/HYSTEROSCOPY WITH MYOSURE N/A 10/24/2019   Procedure: DILATATION & CURETTAGE/HYSTEROSCOPY WITH MYOSURE;  Surgeon: Waymon Amato, MD;  Location: [place];  Service: Gynecology;  Laterality: N/A;  . DILATION AND EVACUATION N/A 02/12/2016   Procedure: DILATATION AND EVACUATION WITH SUCTION ;  Surgeon: Eldred Manges, MD;  Location: [facility];  Service: Gynecology;  Laterality: N/A;  . EXTERNAL FIXATION LEG Left 07/05/2019   Procedure: EXTERNAL FIXATION LEFT ANKLE;  Surgeon: Hiram Gash, MD;  Location: [place];  Service: Orthopedics;  Laterality: Left;  . EXTERNAL FIXATION REMOVAL Left 07/19/2019   Procedure: REMOVAL EXTERNAL FIXATION LEG;  Surgeon: Shona Needles, MD;  Location: [place];  Service: Orthopedics;  Laterality: Left;  . HYSTEROSCOPY WITH  RESECTOSCOPE N/A 02/12/2016   Procedure: HYSTEROSCOPIC MYOMECTOMY WITH RESECTOSCOPE;  Surgeon: Eldred Manges, MD;  Location: [facility];  Service: Gynecology;  Laterality: N/A;  . I & D EXTREMITY Left 07/05/2019   Procedure: IRRIGATION AND DEBRIDEMENT left ankle ;  Surgeon: Hiram Gash, MD;  Location: [place];  Service: Orthopedics;  Laterality: Left;  . I & D EXTREMITY Left 07/08/2019   Procedure: IRRIGATION AND DEBRIDEMENT EXTREMITY WITH ADJUSTMENT OF EXTERNAL FIXATOR;  Surgeon: Shona Needles, MD;  Location: [place];  Service: Orthopedics;  Laterality: Left;  . LAPAROSCOPIC VAGINAL HYSTERECTOMY WITH SALPINGECTOMY Bilateral 11/19/2020   Procedure: LAPAROSCOPIC ASSISTED VAGINAL HYSTERECTOMY WITH SALPINGO-OOPHARECTOMY;  Surgeon: Everett Graff, MD;  Location: [facility];  Service: Gynecology;  Laterality: Bilateral;  . MASS EXCISION Right 08/30/2017   Procedure: EXCISION RIGHT UPPER ARM MASS;  Surgeon: Coralie Keens, MD;  Location: [place];  Service: General;  Laterality: Right;  . MYOMECTOMY N/A 02/12/2016   Procedure: ABDOMINAL MYOMECTOMY;  Surgeon: Eldred Manges, MD;  Location: [facility];  Service: Gynecology;  Laterality: N/A;  . OPEN REDUCTION INTERNAL FIXATION (ORIF) TIBIA/FIBULA FRACTURE Left 07/19/2019   Procedure: OPEN REDUCTION INTERNAL FIXATION (ORIF) LEFT PILON;  Surgeon: Shona Needles, MD;  Location: [place];  Service: Orthopedics;  Laterality: Left;  . ORIF ANKLE FRACTURE Right 07/08/2019   Procedure: OPEN REDUCTION INTERNAL FIXATION (ORIF) ANKLE FRACTURE;  Surgeon: Shona Needles, MD;  Location: [place];  Service: Orthopedics;  Laterality: Right;  . UPPER GI ENDOSCOPY    . WISDOM TOOTH EXTRACTION  Allergies  Allergies  Allergen Reactions  . Chlorhexidine Gluconate Itching    History of Present Illness    [name] has a PMH of GERD, alopecia, iron deficiency anemia, hyperlipidemia, obesity, and atypical chest pain.  She was initially  seen 8/19 for evaluation of chest pain.  She presented to the emergency department 3/19 with chest pain and had been experiencing intermittent periods of pain for years.  She was noted to first have pain in 2015.  She reported that her episodes would occur for almost 5 days a week.  She reported chest tightness but also had numbness and tingling that radiated down her left arm and leg.  It was not associated with shortness of breath, nausea, or diaphoresis.  She would note the symptoms almost every time she would exercise and especially in Zumba class.  She also reported symptoms would occur sporadically.  She was seen and evaluated by Dr. Einar Gip in 2016.  She underwent exercise Myoview that was negative for ischemia.  Her echocardiogram is reported to have been normal.  She was referred for coronary CTA 9/19 which showed a calcium score of 0 no coronary artery disease.  She was seen by Dr. Oval Linsey 10/05/2018.  During that time she was doing well.  She had no symptoms of recurrent chest pain.  She was taking Nexium which was helping her symptoms.  She denied left arm symptoms.  She reported she was running on the treadmill and noticed some numbness in her left leg.  She also reported stabbing pain in her feet when laying down.  She denied calf discomfort.  She denied lower extremity swelling, orthopnea, and PND.  She denied lightheadedness and dizziness.  She continued to exercise fairly regularly.  She contacted nurse triage line on 02/03/2021 and reported that she was having pain in the center of her chest, nausea, and dizziness.  She also reported pain in her left arm.  She felt like the symptoms may be related to acid reflux.  She took 3 Tums and had no relief.  She was also having heart palpitations.  At the time of the call she reported she felt anxious and denied chest pain.  She presented to the clinic 02/06/21 for further evaluation stated she noticed some left arm pain, jaw pain and substernal chest  pain surrounding a presentation that she was giving.  She reported that the symptoms subsided after she presented.  We reviewed her coronary CTA and I provided reassurance.  She reported that she also had noticed several episodes of occasional heart palpitations.  She reported that her resting heart rate recently had been over 100.  She was recently at her GYN who evaluated her thyroid which she reported was normal.  I  asked her to avoid triggers caffeine, chocolate, EtOH, dehydration and  ordered a 14-day ZIO monitor.  She denied bleeding issues.    Her cardiac event monitor showed predominantly normal sinus rhythm with rare PACs and PVCs.    She presents the clinic today for follow-up evaluation states ***.    Today she denies chest pain, shortness of breath, lower extremity edema, fatigue, palpitations, melena, hematuria, hemoptysis, diaphoresis, weakness, presyncope, syncope, orthopnea, and PND.  Home Medications    Prior to Admission medications   Medication Sig Start Date End Date Taking? Authorizing Provider  calcium carbonate (TUMS - DOSED IN MG ELEMENTAL CALCIUM) 500 MG chewable tablet Chew 1 tablet by mouth daily as needed for indigestion or heartburn.     [provider]  ibuprofen (ADVIL) 600 MG tablet take 1 tablet po pc every 6 hours for 5 days then prn for post operative pain 11/20/20   Earnstine Regal, PA-C  Multiple Vitamins-Minerals (MULTIVITAMIN GUMMIES ADULT PO) Take 2 tablets by mouth daily.    [provider]  omeprazole (PRILOSEC) 40 MG capsule Take 40 mg by mouth daily as needed (acid reflux).  08/22/20   [provider]  OVER THE COUNTER MEDICATION Take 1 tablet by mouth in the morning, at noon, and at bedtime. Goli Gummies    [provider]  valACYclovir (VALTREX) 500 MG tablet Take 1 tablet (500 mg total) by mouth daily. 11/21/20   Elby Beck, FNP    Family History    Family History  Problem Relation Age of Onset  .  Hypertension Mother   . Diabetes Father   . Congenital heart disease Father   . Anemia Sister   . Lung cancer Maternal Grandmother   . Ovarian cancer Paternal Grandmother   . Alzheimer's disease Paternal Grandfather    She indicated that her mother is alive. She indicated that her father is alive. She indicated that her sister is alive. She indicated that her maternal grandmother is deceased. She indicated that her maternal grandfather is deceased. She indicated that her paternal grandmother is deceased. She indicated that her paternal grandfather is alive.  Social History    Social History   Socioeconomic History  . Marital status: Married    Spouse name: Shawn  . Number of children: 0  . Years of education: Masters  . Highest education level: Not on file  Occupational History  . Not on file  Tobacco Use  . Smoking status: Never Smoker  . Smokeless tobacco: Never Used  Vaping Use  . Vaping Use: Never used  Substance and Sexual Activity  . Alcohol use: Yes    Alcohol/week: 0.0 standard drinks    Comment: 5 times a year  . Drug use: Never  . Sexual activity: Yes    Birth control/protection: I.U.D.  Other Topics Concern  . Not on file  Social History Narrative   10/28/20   From: [place] originally, moved in [place]: with husband, Shawn (2021)   Work:  [employer] at [employer]       Family: mom is Solicitor, aunt, sister, cousin nearby too      Enjoys: dance, Probation officer, eating      Exercise: 5 days a week - youtube dance video, [employer] weight lifting    Diet: eat anything, tries to eat healthy - on weight watchers       Safety   Seat belts: Yes    Guns: No   Safe in relationships: Yes    Social Determinants of Radio broadcast assistant Strain: Not on file  Food Insecurity: Not on file  Transportation Needs: Not on file  Physical Activity: Not on file  Stress: Not on file  Social Connections: Not on file  Intimate Partner Violence: Not on file      Review of Systems    General:  No chills, fever, night sweats or weight changes.  Cardiovascular:  No chest pain, dyspnea on exertion, edema, orthopnea, palpitations, paroxysmal nocturnal dyspnea. Dermatological: No rash, lesions/masses Respiratory: No cough, dyspnea Urologic: No hematuria, dysuria Abdominal:   No nausea, vomiting, diarrhea, bright red blood per rectum, melena, or hematemesis Neurologic:  No visual changes, wkns, changes in mental status. All other systems reviewed and are  otherwise negative except as noted above.  Physical Exam    VS:  There were no vitals taken for this visit. , BMI There is no height or weight on file to calculate BMI. GEN: Well nourished, well developed, in no acute distress. HEENT: normal. Neck: Supple, no JVD, carotid bruits, or masses. Cardiac: RRR, no murmurs, rubs, or gallops. No clubbing, cyanosis, edema.  Radials/DP/PT 2+ and equal bilaterally.  Respiratory:  Respirations regular and unlabored, clear to auscultation bilaterally. GI: Soft, nontender, nondistended, BS + x 4. MS: no deformity or atrophy. Skin: warm and dry, no rash. Neuro:  Strength and sensation are intact. Psych: Normal affect.  Accessory Clinical Findings    Recent Labs: 10/28/2020: ALT 19 11/20/2020: BUN 8; Creatinine, Ser 0.80; Hemoglobin 10.8; Platelets 276; Potassium 4.2; Sodium 135   Recent Lipid Panel    Component Value Date/Time   CHOL 169 10/28/2020 1628   CHOL 217 (H) 10/05/2018 0848   TRIG 60.0 10/28/2020 1628   HDL 42.10 10/28/2020 1628   HDL 68 10/05/2018 0848   CHOLHDL 4 10/28/2020 1628   VLDL 12.0 10/28/2020 1628   LDLCALC 115 (H) 10/28/2020 1628   LDLCALC 138 (H) 10/05/2018 0848    ECG personally reviewed by me today- *** - No acute changes  EKG 02/06/2021 normal sinus rhythm nonspecific T wave abnormality 73 bpm- No acute changes  EKG 07/19/2018 Normal sinus rhythm with sinus arrhythmia, nonspecific T wave abnormality 71  bpm   Nuclear stress test 03/14/2015 Negative for ischemia  Coronary CTA 09/04/2018 IMPRESSION: 1. Normal right dominant coronary arteries  2. Calcium score 0  3. Normal aortic root 2.9 cm  [place]  11 Day Zio Monitor  Quality: Fair.  Baseline artifact. Predominant rhythm: sinus rhythm Average heart rate: 87 bpm Max heart rate: 185 bpm Min heart rate: 53 bpm  Rare PACs/PVCs No significant arrhythmias  Tiffany C. Oval Linsey, MD, [facility] 03/06/2021 11:00 AM   Assessment & Plan   1.  Palpitations- denies recent palpitations.  Had noticed intermittent periods of palpitations in February 2022.    Cardiac event monitor showed predominantly normal sinus rhythm with rare PACs and PVCs. Avoid triggers caffeine, chocolate, EtOH, dehydration etc. Heart healthy low-sodium diet-salty 6 given Increase physical activity as tolerated Reviewed cardiac event monitor and reassured about PACs/PVCs    Atypical chest pain- resolved.   Appears to be related to reflux and anxiety.  Discussed GERD diet and techniques to prevent reflux. Heart healthy low-sodium diet Increase physical activity as tolerated No further ischemic work-up planned at this time. Reassured it was not related to cardiac issues. GERD handout given/reviewed  Hyperlipidemia-10/28/2020: Cholesterol 169; HDL 42.10; LDL Cholesterol 115; Triglycerides 60.0; VLDL 12.0 Heart healthy low-sodium high-fiber diet Increase physical activity as tolerated  GERD- controlled.  Again reports compliance with omeprazole. Continue omeprazole, Tums GERD diet information given Increase physical activity as tolerated  Left arm/leg tingling-chronic previously recommended follow-up with neurology.*** Follows with PCP  Disposition: Follow-up with Dr. Oval Linsey in 6 months.   Jossie Ng. Haron Beilke NP-C    04/06/2021, 11:31 AM [address] Office [phone] Fax [phone]  Notice: This dictation was prepared with Dragon dictation along with smaller phrase technology. Any transcriptional errors that result from this process are unintentional and may not be corrected upon review.  I spent***minutes examining this patient, reviewing medications, and using patient centered shared decision making involving her cardiac care.  Prior to her visit I spent greater than  20 minutes reviewing her past medical history,  medications, and prior cardiac tests.

## 2021-04-07 ENCOUNTER — Ambulatory Visit: Payer: Self-pay | Admitting: General Practice

## 2021-05-08 ENCOUNTER — Telehealth: Payer: Self-pay | Admitting: Family Medicine

## 2021-05-08 ENCOUNTER — Telehealth (INDEPENDENT_AMBULATORY_CARE_PROVIDER_SITE_OTHER): Payer: BC Managed Care – PPO | Admitting: Family Medicine

## 2021-05-08 ENCOUNTER — Other Ambulatory Visit: Payer: Self-pay

## 2021-05-08 ENCOUNTER — Encounter: Payer: Self-pay | Admitting: Family Medicine

## 2021-05-08 VITALS — BP 113/76 | HR 74 | Temp 98.0°F | Ht 64.0 in | Wt 172.2 lb

## 2021-05-08 DIAGNOSIS — E663 Overweight: Secondary | ICD-10-CM | POA: Diagnosis not present

## 2021-05-08 DIAGNOSIS — U071 COVID-19: Secondary | ICD-10-CM | POA: Diagnosis not present

## 2021-05-08 MED ORDER — NIRMATRELVIR/RITONAVIR (PAXLOVID)TABLET: 3.0000 | ORAL_TABLET | Freq: Two times a day (BID) | ORAL | 0 refills | Status: AC

## 2021-05-08 NOTE — Progress Notes (Signed)
Patient ID: Ana Freeman, female    DOB: February 01, 1971, 50 y.o.   MRN: [id number]  Virtual visit completed through [employer], a video enabled telemedicine application. Due to national recommendations of social distancing due to COVID-19, a virtual visit is felt to be most appropriate for this patient at this time. Reviewed limitations, risks, security and privacy concerns of performing a virtual visit and the availability of in person appointments. I also reviewed that there may be a patient responsible charge related to this service. The patient agreed to proceed.   Patient location: home Provider location: [place] [facility], office Persons participating in this virtual visit: patient, provider   If any vitals were documented, they were collected by patient at home unless specified below.    BP 113/76   Pulse 74   Temp 98 F (36.7 C)   Ht 5\' 4"  (1.626 m)   Wt 172 lb 3 oz (78.1 kg)   LMP 11/05/2020   BMI 29.56 kg/m    CC: COVID positive Subjective:   HPI: Ana Freeman is a 50 y.o. female presenting on 05/08/2021 for Cough (C/o cough, body aches, chills, HA, head congestion and runny nose.  Denies any fever.  Sxs started 05/04/21.  Received pos COVID results on 02/07/21 from [facility].  Now only c/o dry cough, chest and head congestion. Taking TheraFlu, barley helpful. )   First day of symptoms: 05/05/2021 Tested COVID positive: 05/07/2021  Current symptoms: dry cough, head > chest congestion, mild ST. Initial chills and body aches. Cough is not keeping her up at night.  No: fevers, loss of taste or smell, abd pain, nausea, diarrhea, dyspnea, fatigue  Treatments to date: TheraFlu without benefit.  Risk factors include: BMI >25 Exposed to sick family member over the weekend.   COVID vaccination status: Pfizer 02/2020, 03/2020, booster 10/2020      Relevant past medical, surgical, family and social history reviewed and updated as indicated. Interim medical history since our last  visit reviewed. Allergies and medications reviewed and updated. Outpatient Medications Prior to Visit  Medication Sig Dispense Refill  . calcium carbonate (TUMS - DOSED IN MG ELEMENTAL CALCIUM) 500 MG chewable tablet Chew 1 tablet by mouth daily as needed for indigestion or heartburn.     . Multiple Vitamins-Minerals (MULTIVITAMIN GUMMIES ADULT PO) Take 2 tablets by mouth daily.    Marland Kitchen omeprazole (PRILOSEC) 40 MG capsule Take 40 mg by mouth daily as needed (acid reflux).     Marland Kitchen OVER THE COUNTER MEDICATION Take 1 tablet by mouth in the morning, at noon, and at bedtime. Goli Gummies    . valACYclovir (VALTREX) 500 MG tablet Take 1 tablet (500 mg total) by mouth daily. 90 tablet 0  . ibuprofen (ADVIL) 600 MG tablet take 1 tablet po pc every 6 hours for 5 days then prn for post operative pain 30 tablet 1   No facility-administered medications prior to visit.     Per HPI unless specifically indicated in ROS section below Review of Systems Objective:  BP 113/76   Pulse 74   Temp 98 F (36.7 C)   Ht 5\' 4"  (1.626 m)   Wt 172 lb 3 oz (78.1 kg)   LMP 11/05/2020   BMI 29.56 kg/m   Wt Readings from Last 3 Encounters:  05/08/21 172 lb 3 oz (78.1 kg)  02/06/21 183 lb 3.2 oz (83.1 kg)  11/19/20 186 lb 8 oz (84.6 kg)       Physical exam:  Gen: alert, NAD, not ill appearing Pulm: speaks in complete sentences without increased work of breathing Psych: normal mood, normal thought content      Lab Results  Component Value Date   CREATININE 0.80 11/20/2020   BUN 8 11/20/2020   NA 135 11/20/2020   K 4.2 11/20/2020   CL 105 11/20/2020   CO2 23 11/20/2020    Assessment & Plan:   Problem List Items Addressed This Visit    Overweight (BMI 25.0-29.9)   COVID-19 virus infection - Primary    Reviewed anticipated course of recovery, isolation guidelines, as well as red flags to seek urgent care over weekend.  Discussed option of Paxlovid antiviral along with common side effects and its approval  under EUA.  Drug interactions: recommend hold omeprazole while on antiviral medication.  Discussed further supportive care at home, recommend robitussin OTC cough syrup for cough.       Relevant Medications   nirmatrelvir/ritonavir EUA (PAXLOVID) TABS       Meds ordered this encounter  Medications  . nirmatrelvir/ritonavir EUA (PAXLOVID) TABS    Sig: Take 3 tablets by mouth 2 (two) times daily for 5 days. Patient GFR is 80s. Take nirmatrelvir (150 mg) two tablets twice daily for 5 days and ritonavir (100 mg) one tablet twice daily for 5 days.    Dispense:  30 tablet    Refill:  0   No orders of the defined types were placed in this encounter.   I discussed the assessment and treatment plan with the patient. The patient was provided an opportunity to ask questions and all were answered. The patient agreed with the plan and demonstrated an understanding of the instructions. The patient was advised to call back or seek an in-person evaluation if the symptoms worsen or if the condition fails to improve as anticipated.  Follow up plan: No follow-ups on file.  Ria Bush, MD

## 2021-05-08 NOTE — Telephone Encounter (Signed)
I spoke with pt; pt started with symptoms on 05/04/21; pt had h/a, body aches and chills and runny nose. Pt did not ck temp. Head congestion and was like in a fog. Dry cough that started on 05/07/21. No SOB and pt has slight pain in chest when coughs and has some soreness in mid chest.  Pt does not feel any distress with CP or breathing. Pt is not having wheezing. Pt scheduled video visit with Dr Darnell Level today at [facility] ED precautions given and pt voiced understanding.

## 2021-05-08 NOTE — Assessment & Plan Note (Addendum)
Reviewed anticipated course of recovery, isolation guidelines, as well as red flags to seek urgent care over weekend.  Discussed option of Paxlovid antiviral along with common side effects and its approval under EUA.  Drug interactions: recommend hold omeprazole while on antiviral medication.  Discussed further supportive care at home, recommend robitussin OTC cough syrup for cough.

## 2021-05-08 NOTE — Telephone Encounter (Signed)
Patient is covid positive. Started on Monday. Patient is not in distress at all. Just has a cough, congestion, Runny nose, body chills and aches. Tested positive on 05/07/21.  Can you please call her back to discuss. EM

## 2021-06-22 LAB — HM MAMMOGRAPHY

## 2021-06-30 ENCOUNTER — Encounter: Payer: Self-pay | Admitting: Family Medicine

## 2021-09-07 ENCOUNTER — Encounter: Payer: Self-pay | Admitting: Gastroenterology

## 2021-10-12 ENCOUNTER — Ambulatory Visit: Payer: BC Managed Care – PPO | Admitting: Family Medicine

## 2021-10-12 ENCOUNTER — Other Ambulatory Visit: Payer: Self-pay

## 2021-10-12 VITALS — BP 100/80 | HR 93 | Temp 96.0°F | Ht 65.0 in | Wt 178.4 lb

## 2021-10-12 DIAGNOSIS — K219 Gastro-esophageal reflux disease without esophagitis: Secondary | ICD-10-CM

## 2021-10-12 DIAGNOSIS — M25561 Pain in right knee: Secondary | ICD-10-CM

## 2021-10-12 DIAGNOSIS — D500 Iron deficiency anemia secondary to blood loss (chronic): Secondary | ICD-10-CM

## 2021-10-12 DIAGNOSIS — M79641 Pain in right hand: Secondary | ICD-10-CM

## 2021-10-12 DIAGNOSIS — E7800 Pure hypercholesterolemia, unspecified: Secondary | ICD-10-CM

## 2021-10-12 DIAGNOSIS — R7309 Other abnormal glucose: Secondary | ICD-10-CM | POA: Diagnosis not present

## 2021-10-12 LAB — COMPREHENSIVE METABOLIC PANEL
ALT: 26 U/L (ref 0–35)
AST: 19 U/L (ref 0–37)
Albumin: 4.4 g/dL (ref 3.5–5.2)
Alkaline Phosphatase: 123 U/L — ABNORMAL HIGH (ref 39–117)
BUN: 12 mg/dL (ref 6–23)
CO2: 27 mEq/L (ref 19–32)
Calcium: 11.7 mg/dL — ABNORMAL HIGH (ref 8.4–10.5)
Chloride: 107 mEq/L (ref 96–112)
Creatinine, Ser: 0.89 mg/dL (ref 0.40–1.20)
GFR: 76.41 mL/min (ref >60.00)
Glucose, Bld: 91 mg/dL (ref 70–99)
Potassium: 5.6 mEq/L — ABNORMAL HIGH (ref 3.5–5.1)
Sodium: 140 mEq/L (ref 135–145)
Total Bilirubin: 0.5 mg/dL (ref 0.2–1.2)
Total Protein: 7.3 g/dL (ref 6.0–8.3)

## 2021-10-12 LAB — CBC
HCT: 41.5 % (ref 36.0–46.0)
Hemoglobin: 13.6 g/dL (ref 12.0–15.0)
MCHC: 32.8 g/dL (ref 30.0–36.0)
MCV: 86.5 fl (ref 78.0–100.0)
Platelets: 252 10*3/uL (ref 150.0–400.0)
RBC: 4.81 Mil/uL (ref 3.87–5.11)
RDW: 13.6 % (ref 11.5–15.5)
WBC: 3 10*3/uL — ABNORMAL LOW (ref 4.0–10.5)

## 2021-10-12 LAB — HEMOGLOBIN A1C: Hgb A1c MFr Bld: 5.6 % (ref 4.6–6.5)

## 2021-10-12 LAB — LIPID PANEL
Cholesterol: 238 mg/dL — ABNORMAL HIGH (ref 0–200)
HDL: 62 mg/dL (ref >39.00)
LDL Cholesterol: 164 mg/dL — ABNORMAL HIGH (ref 0–99)
NonHDL: 175.83
Total CHOL/HDL Ratio: 4
Triglycerides: 57 mg/dL (ref 0.0–149.0)
VLDL: 11.4 mg/dL (ref 0.0–40.0)

## 2021-10-12 MED ORDER — PANTOPRAZOLE SODIUM 20 MG PO TBEC: 20.0000 mg | DELAYED_RELEASE_TABLET | Freq: Every day | ORAL | 1 refills | Status: DC

## 2021-10-12 NOTE — Assessment & Plan Note (Signed)
Appreciate GI support - has appt next week. Advised taking PPI (had stopped due to intolerance of capsules) - switch to pantoprazole tablet x 2 weeks to monitor for improvement. If burning symptom not improved - return.

## 2021-10-12 NOTE — Assessment & Plan Note (Signed)
Suspect she may have mild arthritis - with joint line ttp. No ligament or meniscus signs. Given limited impact on life - advised adding squats and lunges to her work-out routine. Consider XR vs PT if not improving

## 2021-10-12 NOTE — Assessment & Plan Note (Signed)
Suspect possible prediabetes - will check hgb a1c

## 2021-10-12 NOTE — Patient Instructions (Signed)
Heart Burn - Stop omeprazole - start Pantoprazole 20 mg - take daily for at least 2 weeks  Knee pain - squats and lunges  - make sure you are stretching - stationary bike may be good  Hand/Thumb - consider physical therapy - can also try thumb brace - if no change within 1-2 weeks of wearing than would stop

## 2021-10-12 NOTE — Progress Notes (Signed)
Subjective:     Ana Freeman is a 50 y.o. female presenting for Extremity Weakness (In hands when holding things ), Knee Pain (R when walking up steps. No pain when walking on flat surface ), Heartburn (Pt states that she has a burning/uncomfortable sensation in middle of her chest. ), and Mass (Across back of neck. )     Extremity Weakness   Knee Pain   Heartburn She complains of heartburn.    #Knee pain - right side - started 1 month ago - walking up stairs - can get on the treadmill w/o issues - both sides of the patella - favoring left side - no recent knee injury - no numbness or tingling - popping  - no locking or buckling - treatment: nothing - as only bothersome with stairs  #Hand pain - when holding tooth brush, hair, writing - right hand only - weakness in the thumb - no tingling/numbness  - improves once she stops the action - no tremor - treatment: none, rest - no elbow or shoulder pain - no known injury - Right hand dominate - not dropping anything - just getting fatigued  #Heartburn/chest burning - has EGD and colonoscopy coming up - had covid may  - has a burning sensation since then - has a prescription for omeprazole - has been taking tums and papaya enzymes  - did take omeprazole for 2 weeks initially  - omeprazole is a capsule and felt it was lodging in her chest - and was mixing with applesauce   Review of Systems  Gastrointestinal:  Positive for heartburn.  Musculoskeletal:  Positive for extremity weakness.    Social History   Tobacco Use  Smoking Status Never  Smokeless Tobacco Never        Objective:    BP Readings from Last 3 Encounters:  10/12/21 100/80  05/08/21 113/76  02/06/21 122/78   Wt Readings from Last 3 Encounters:  10/12/21 178 lb 6 oz (80.9 kg)  05/08/21 172 lb 3 oz (78.1 kg)  02/06/21 183 lb 3.2 oz (83.1 kg)    BP 100/80   Pulse 93   Temp (!) 96 F (35.6 C) (Temporal)   Ht 5\' 5"  (1.651 m)    Wt 178 lb 6 oz (80.9 kg)   LMP 11/05/2020   SpO2 98%   BMI 29.68 kg/m    Physical Exam Constitutional:      General: She is not in acute distress.    Appearance: She is well-developed. She is not diaphoretic.  HENT:     Right Ear: External ear normal.     Left Ear: External ear normal.  Eyes:     Conjunctiva/sclera: Conjunctivae normal.  Cardiovascular:     Rate and Rhythm: Normal rate.  Pulmonary:     Effort: Pulmonary effort is normal.  Musculoskeletal:     Cervical back: Neck supple.     Comments: Right Hand Inspection: no swelling or erythema Palpation: no joint ttp, wrist ttp, elbow ttp ROM: normal w/o pain Strength: normal grip, opposition, finger strength Phallen and tinel - negative Elbow tinel - negative Thumb grind - negative  Right knee: Inspection: no swelling Palpation: medial joint line ttp, no patella, anterior/posterior or lateral pain ROM: normal Strength: normal Ligaments: intact Able to do single leg squat on right and twist w/o pain  Skin:    General: Skin is warm and dry.     Capillary Refill: Capillary refill takes less than 2 seconds.  Neurological:  Mental Status: She is alert. Mental status is at baseline.  Psychiatric:        Mood and Affect: Mood normal.        Behavior: Behavior normal.          Assessment & Plan:   Problem List Items Addressed This Visit       Digestive   GERD (gastroesophageal reflux disease) - Primary    Appreciate GI support - has appt next week. Advised taking PPI (had stopped due to intolerance of capsules) - switch to pantoprazole tablet x 2 weeks to monitor for improvement. If burning symptom not improved - return.       Relevant Medications   pantoprazole (PROTONIX) 20 MG tablet   Other Relevant Orders   Comprehensive metabolic panel     Other   Anemia, iron deficiency    S/p hysterectomy. Will reassess.       Relevant Orders   CBC   Hyperlipidemia   Relevant Orders   Lipid panel    Acute pain of right knee    Suspect she may have mild arthritis - with joint line ttp. No ligament or meniscus signs. Given limited impact on life - advised adding squats and lunges to her work-out routine. Consider XR vs PT if not improving      Right hand pain    Etiology unclear - she notes weakness but no signs of carpal tunnel and good strength on exam. Offered PT, she will watch and wait. Discussed she could try a thumb brace for arthritis but no pain testing so advised 2 week trial and to stop if no improvement.       Elevated glucose    Suspect possible prediabetes - will check hgb a1c      Relevant Orders   Hemoglobin A1c     Return if symptoms worsen or fail to improve.  Lesleigh Noe, MD  This visit occurred during the SARS-CoV-2 public health emergency.  Safety protocols were in place, including screening questions prior to the visit, additional usage of staff PPE, and extensive cleaning of exam room while observing appropriate contact time as indicated for disinfecting solutions.

## 2021-10-12 NOTE — Assessment & Plan Note (Signed)
Etiology unclear - she notes weakness but no signs of carpal tunnel and good strength on exam. Offered PT, she will watch and wait. Discussed she could try a thumb brace for arthritis but no pain testing so advised 2 week trial and to stop if no improvement.

## 2021-10-12 NOTE — Assessment & Plan Note (Signed)
S/p hysterectomy. Will reassess.

## 2021-10-13 ENCOUNTER — Other Ambulatory Visit: Payer: Self-pay | Admitting: Family Medicine

## 2021-10-13 DIAGNOSIS — E875 Hyperkalemia: Secondary | ICD-10-CM

## 2021-10-16 ENCOUNTER — Other Ambulatory Visit
Admission: RE | Admit: 2021-10-16 | Discharge: 2021-10-16 | Disposition: A | Discharge status: Home / Self Care | Payer: BC Managed Care – PPO | Attending: Family Medicine | Admitting: Family Medicine

## 2021-10-16 DIAGNOSIS — E875 Hyperkalemia: Secondary | ICD-10-CM | POA: Insufficient documentation

## 2021-10-16 LAB — BASIC METABOLIC PANEL
Anion gap: 4 — ABNORMAL LOW (ref 5–15)
BUN: 14 mg/dL (ref 6–20)
CO2: 28 mmol/L (ref 22–32)
Calcium: 11.5 mg/dL — ABNORMAL HIGH (ref 8.9–10.3)
Chloride: 108 mmol/L (ref 98–111)
Creatinine, Ser: 0.91 mg/dL (ref 0.44–1.00)
GFR, Estimated: >60 mL/min (ref >60)
Glucose, Bld: 102 mg/dL — ABNORMAL HIGH (ref 70–99)
Potassium: 5.6 mmol/L — ABNORMAL HIGH (ref 3.5–5.1)
Sodium: 140 mmol/L (ref 135–145)

## 2021-10-17 ENCOUNTER — Other Ambulatory Visit: Payer: Self-pay | Admitting: Family Medicine

## 2021-10-17 DIAGNOSIS — E875 Hyperkalemia: Secondary | ICD-10-CM

## 2021-10-17 LAB — PTH, INTACT AND CALCIUM
Calcium, Total (PTH): 11.5 mg/dL — ABNORMAL HIGH (ref 8.7–10.2)
PTH: 66 pg/mL — ABNORMAL HIGH (ref 15–65)

## 2021-10-17 MED ORDER — SODIUM ZIRCONIUM CYCLOSILICATE 5 G PO PACK: 10.0000 g | PACK | Freq: Every day | ORAL | 0 refills | Status: DC

## 2021-10-19 ENCOUNTER — Encounter: Payer: Self-pay | Admitting: Gastroenterology

## 2021-10-19 ENCOUNTER — Telehealth: Payer: Self-pay

## 2021-10-19 ENCOUNTER — Ambulatory Visit: Payer: BC Managed Care – PPO | Admitting: Gastroenterology

## 2021-10-19 VITALS — BP 110/78 | HR 73 | Ht 64.0 in | Wt 182.0 lb

## 2021-10-19 DIAGNOSIS — Z79899 Other long term (current) drug therapy: Secondary | ICD-10-CM

## 2021-10-19 DIAGNOSIS — Z1211 Encounter for screening for malignant neoplasm of colon: Secondary | ICD-10-CM | POA: Diagnosis not present

## 2021-10-19 DIAGNOSIS — K219 Gastro-esophageal reflux disease without esophagitis: Secondary | ICD-10-CM | POA: Diagnosis not present

## 2021-10-19 MED ORDER — SUTAB 1479-225-188 MG PO TABS: 1.0000 | ORAL_TABLET | Freq: Once | ORAL | 0 refills | Status: AC

## 2021-10-19 MED ORDER — PANTOPRAZOLE SODIUM 20 MG PO TBEC
20.0000 mg | DELAYED_RELEASE_TABLET | Freq: Every day | ORAL | 3 refills | Status: DC
Start: 2021-10-19 — End: 2022-02-04

## 2021-10-19 NOTE — Telephone Encounter (Signed)
Appreciate pharmacy report.

## 2021-10-19 NOTE — Progress Notes (Signed)
HPI :  50 year old female with a history of GERD, hypercalcemia, hyperlipidemia, referred by Waunita Schooner, MD for GERD, colon cancer screening.  The patient states she has never had a prior colonoscopy.  She denies any problems with her bowel habits, states she is very regular.  No blood in her stools.  No family history of colon cancer or esophageal cancer.  No abdominal pains that bother her.  She denies any problems with anesthesia in the past.  She has had a history of reflux for some years.  More recently she has had a lot of burning/pyrosis in her chest as well as feelings of needing to regurgitate to find relief from some of the symptoms.  This bothers her both after eating as well as lying down at night.  She does find some relief when she lies on her left side.  She denies any dysphagia at baseline, sometimes certain foods such as salads and apples can make her symptoms worse.  She had an EGD in 2017 it sounds like with Dr. Collene Mares, no records of that on file today.  She states she thought it was normal but cannot recall.  Denies any cardiopulmonary symptoms.  She had been on omeprazole 40 mg and states it was not working to control her symptoms.  She felt the capsule getting stuck in her throat when she swallowed it.  She was transitioned to Protonix within the past week at 20 mg daily.  She states hard to tell if it is working any better but he thinks it is.  She has been using quite a bit of Tums, upwards of a few tablets 3-4 times a day to control her breakthrough symptoms.  She had been noted by her primary care recently to have hyperkalemia, also noted to have some hypercalcemia as well her calcium levels been in the 11's over the past year.  She had a PTH level drawn by Dr. Einar Pheasant that is just over the limit of normal at 66.  Labs 10/12/21: Hgb 13.6, MCV 86.5 K of 5.6 - given regimen to lower it Calcium levels of 11s, PTH 66, Ca 11.5    Past Medical History:  Diagnosis Date   Anemia     Brain lesion    Closed compression fracture of body of L1 vertebra (HCC) 07/06/2019   GERD (gastroesophageal reflux disease)    Hyperlipidemia 07/19/2018   Nondisplaced fracture of posterior process of right talus 07/09/2019   Numbness of lower extremity    Parathyroid abnormality [facility])      Past Surgical History:  Procedure Laterality Date   ANKLE CLOSED REDUCTION Right 07/05/2019   Procedure: Closed Reduction Ankle;  Surgeon: Hiram Gash, MD;  Location: [place];  Service: Orthopedics;  Laterality: Right;   COLONOSCOPY     pt denies    CYSTOSCOPY N/A 11/19/2020   Procedure: CYSTOSCOPY;  Surgeon: Everett Graff, MD;  Location: [facility];  Service: Gynecology;  Laterality: N/A;   DILATATION & CURETTAGE/HYSTEROSCOPY WITH MYOSURE N/A 10/24/2019   Procedure: DILATATION & CURETTAGE/HYSTEROSCOPY WITH MYOSURE;  Surgeon: Waymon Amato, MD;  Location: [place];  Service: Gynecology;  Laterality: N/A;   DILATION AND EVACUATION N/A 02/12/2016   Procedure: DILATATION AND EVACUATION WITH SUCTION ;  Surgeon: Eldred Manges, MD;  Location: [facility];  Service: Gynecology;  Laterality: N/A;   EXTERNAL FIXATION LEG Left 07/05/2019   Procedure: EXTERNAL FIXATION LEFT ANKLE;  Surgeon: Hiram Gash, MD;  Location: [place];  Service: Orthopedics;  Laterality: Left;   EXTERNAL FIXATION REMOVAL Left 07/19/2019   Procedure: REMOVAL EXTERNAL FIXATION LEG;  Surgeon: Shona Needles, MD;  Location: [place];  Service: Orthopedics;  Laterality: Left;   HYSTEROSCOPY WITH RESECTOSCOPE N/A 02/12/2016   Procedure: HYSTEROSCOPIC MYOMECTOMY WITH RESECTOSCOPE;  Surgeon: Eldred Manges, MD;  Location: [facility];  Service: Gynecology;  Laterality: N/A;   I & D EXTREMITY Left 07/05/2019   Procedure: IRRIGATION AND DEBRIDEMENT left ankle ;  Surgeon: Hiram Gash, MD;  Location: [place];  Service: Orthopedics;  Laterality: Left;   I & D EXTREMITY Left 07/08/2019   Procedure: IRRIGATION AND DEBRIDEMENT  EXTREMITY WITH ADJUSTMENT OF EXTERNAL FIXATOR;  Surgeon: Shona Needles, MD;  Location: [place];  Service: Orthopedics;  Laterality: Left;   LAPAROSCOPIC VAGINAL HYSTERECTOMY WITH SALPINGECTOMY Bilateral 11/19/2020   Procedure: LAPAROSCOPIC ASSISTED VAGINAL HYSTERECTOMY WITH SALPINGO-OOPHARECTOMY;  Surgeon: Everett Graff, MD;  Location[facility];  Service: Gynecology;  Laterality: Bilateral;   MASS EXCISION Right 08/30/2017   Procedure: EXCISION RIGHT UPPER ARM MASS;  Surgeon: Coralie Keens, MD;  Location: [place];  Service: General;  Laterality: Right;   MYOMECTOMY N/A 02/12/2016   Procedure: ABDOMINAL MYOMECTOMY;  Surgeon: Eldred Manges, MD;  Location: [facility];  Service: Gynecology;  Laterality: N/A;   OPEN REDUCTION INTERNAL FIXATION (ORIF) TIBIA/FIBULA FRACTURE Left 07/19/2019   Procedure: OPEN REDUCTION INTERNAL FIXATION (ORIF) LEFT PILON;  Surgeon: Shona Needles, MD;  Location: [place];  Service: Orthopedics;  Laterality: Left;   ORIF ANKLE FRACTURE Right 07/08/2019   Procedure: OPEN REDUCTION INTERNAL FIXATION (ORIF) ANKLE FRACTURE;  Surgeon: Shona Needles, MD;  Location: [place];  Service: Orthopedics;  Laterality: Right;   UPPER GI ENDOSCOPY     WISDOM TOOTH EXTRACTION     Family History  Problem Relation Age of Onset   Hypertension Mother    Diabetes Father    Congenital heart disease Father    Anemia Sister    Lung cancer Maternal Grandmother    Ovarian cancer Paternal Grandmother    Alzheimer's disease Paternal Grandfather    Social History   Tobacco Use   Smoking status: Never   Smokeless tobacco: Never  Vaping Use   Vaping Use: Never used  Substance Use Topics   Alcohol use: Yes    Alcohol/week: 0.0 standard drinks    Comment: 5 times a year   Drug use: Never   Current Outpatient Medications  Medication Sig Dispense Refill   calcium carbonate (TUMS - DOSED IN MG ELEMENTAL CALCIUM) 500 MG chewable tablet Chew 1 tablet by mouth  daily as needed for indigestion or heartburn.      estradiol (ESTRACE) 0.5 MG tablet Take 0.5 mg by mouth daily.     Multiple Vitamins-Minerals (MULTIVITAMIN GUMMIES ADULT PO) Take 2 tablets by mouth daily.     OVER THE COUNTER MEDICATION Take 1 tablet by mouth in the morning, at noon, and at bedtime. [place] Gummies     pantoprazole (PROTONIX) 20 MG tablet Take 1 tablet (20 mg total) by mouth daily. 30 tablet 1   sodium zirconium cyclosilicate (LOKELMA) 5 g packet Take 10 g by mouth daily. 30 packet 0   valACYclovir (VALTREX) 500 MG tablet Take 1 tablet (500 mg total) by mouth daily. 90 tablet 0   No current facility-administered medications for this visit.   Allergies  Allergen Reactions   Chlorhexidine Gluconate Itching     Review of Systems: All systems  reviewed and negative except where noted in HPI.   Lab Results  Component Value Date   WBC 3.0 (L) 10/12/2021   HGB 13.6 10/12/2021   HCT 41.5 10/12/2021   MCV 86.5 10/12/2021   PLT 252.0 10/12/2021    Lab Results  Component Value Date   CREATININE 0.91 10/16/2021   BUN 14 10/16/2021   NA 140 10/16/2021   K 5.6 (H) 10/16/2021   CL 108 10/16/2021   CO2 28 10/16/2021       Physical Exam: BP 110/78   Pulse 73   Ht 5\' 4"  (1.626 m)   Wt 182 lb (82.6 kg)   LMP 11/05/2020   BMI 31.24 kg/m  Constitutional: Pleasant,well-developed, female in no acute distress. HEENT: Normocephalic and atraumatic. Conjunctivae are normal. No scleral icterus. Neck supple.  Cardiovascular: Normal rate, regular rhythm.  Pulmonary/chest: Effort normal and breath sounds normal.  Abdominal: Soft, nondistended, nontender.  There are no masses palpable.  Extremities: no edema Lymphadenopathy: No cervical adenopathy noted. Neurological: Alert and oriented to person place and time. Skin: Skin is warm and dry. No rashes noted. Psychiatric: Normal mood and affect. Behavior is normal.   ASSESSMENT AND PLAN: 50 y/o female here for new patient  consultation regarding the following:  GERD Hypercalcemia Chronic PPI use Colon cancer screening  Patient with longstanding GERD which sounds poorly controlled despite omeprazole for some time. Using significant amount of TUMS routinely for symptom control, it is possible this could be causing hypercalcemia, will let her PCP know the extent of TUMS she was using in case this is related. Given her ongoing symptoms despite low dose PPI, will increase protonix to 20gm BID for now. She just recently switched from omeprazole to protonix and will see how she responds to this regimen. Given ongoing symptoms, I think an EGD is reasonable to further evaluate for large HH, erosive esophagitis / BE, and see her candidacy for TIF, etc if she is fails medical therapy or wants to come off PPI at some point. We did discuss long term risks of chronic PPI use and she understands, hopefully longer term can use lower dose but would not do that now while symptoms are not well controlled.   We otherwise discussed colon cancer screening options. She is average risk for colon cancer and due for routine screening. She wishes to proceed with optical colonoscopy following discussion of options, will be done at the same time as EGD. In the interim she has been started on a regimen by her PCP for hyperkalemia, will await her follow up labs on this regimen to ensure stable.   Plan: - increase protonix to 20mg  BID. Discussed long term risks of chronic PPI as outlined above - stop TUMS given hypercalcemia - schedule EGD and colonoscopy - will follow up with PCP for elevated K and Ca  Jolly Mango, MD [facility]  CC: Lesleigh Noe, MD

## 2021-10-19 NOTE — Patient Instructions (Addendum)
If you are age 49 or older, your body mass index should be between 23-30. Your Body mass index is 31.24 kg/m. If this is out of the aforementioned range listed, please consider follow up with your Primary Care Provider.  If you are age 32 or younger, your body mass index should be between 19-25. Your Body mass index is 31.24 kg/m. If this is out of the aformentioned range listed, please consider follow up with your Primary Care Provider.   ________________________________________________________  The [place] [facility] providers would like to encourage you to use [facility] to communicate with providers for non-urgent requests or questions.  Due to long hold times on the telephone, sending your provider a message by [facility] may be a faster and more efficient way to get a response.  Please allow 48 business hours for a response.  Please remember that this is for non-urgent requests.  _______________________________________________________  Dennis Bast have been scheduled for an endoscopy and colonoscopy. Please follow the written instructions given to you at your visit today. Please pick up your prep supplies at the pharmacy within the next 1-3 days. If you use inhalers (even only as needed), please bring them with you on the day of your procedure.   We have sent the following medications to your pharmacy for you to pick up at your convenience: Protonix 20 mg: Take once daily  Discontinue TUMS.  Thank you for entrusting me with your care and for choosing [facility], Dr. Pinehurst Cellar

## 2021-10-19 NOTE — Telephone Encounter (Signed)
Spoke with patient about medications. Review for hyperkalemia/hypercalcemia.   Her OTC medications include: One a Day Vitacrave Women's Gummy - 2 daily (60 mg calcium in daily serving and no potassium) Nature's Bounty Hair Skin and Nails (Collagen and Biotin) - 2 daily (no calcium or potassium)  She does not take any prescription medications routinely. Uses Valtrex PRN. She is waiting on pharmacy to receive the [facility] and will pick up this evening.  She also notes she was taking a lot of Tums and stopped taking after PCP visit 10/12/21. She will be starting pantoprazole BID per GI.  Summary: Medications not likely contributing to hyperkalemia/calcemia.   [name], PharmD Clinical Pharmacist [facility] [phone]

## 2021-10-20 ENCOUNTER — Telehealth: Payer: Self-pay | Admitting: Family Medicine

## 2021-10-20 DIAGNOSIS — E875 Hyperkalemia: Secondary | ICD-10-CM

## 2021-10-20 NOTE — Telephone Encounter (Signed)
E-consult with endo for suggested work-up for hyperK.   Will get labs done - pending work-up and if Potassium remains elevated may need to consider nephrology consult.

## 2021-10-20 NOTE — Telephone Encounter (Signed)
Pt scheduled labs in [place] station on 11.10.22

## 2021-10-22 ENCOUNTER — Other Ambulatory Visit (INDEPENDENT_AMBULATORY_CARE_PROVIDER_SITE_OTHER): Payer: BC Managed Care – PPO

## 2021-10-22 ENCOUNTER — Other Ambulatory Visit: Payer: Self-pay

## 2021-10-22 DIAGNOSIS — E875 Hyperkalemia: Secondary | ICD-10-CM | POA: Diagnosis not present

## 2021-10-23 ENCOUNTER — Encounter: Payer: Self-pay | Admitting: Family Medicine

## 2021-10-23 DIAGNOSIS — E875 Hyperkalemia: Secondary | ICD-10-CM

## 2021-10-23 LAB — BASIC METABOLIC PANEL
BUN: 17 mg/dL (ref 6–23)
CO2: 29 mEq/L (ref 19–32)
Calcium: 11.8 mg/dL — ABNORMAL HIGH (ref 8.4–10.5)
Chloride: 106 mEq/L (ref 96–112)
Creatinine, Ser: 0.92 mg/dL (ref 0.40–1.20)
GFR: 73.42 mL/min (ref >60.00)
Glucose, Bld: 81 mg/dL (ref 70–99)
Potassium: 5.2 mEq/L — ABNORMAL HIGH (ref 3.5–5.1)
Sodium: 141 mEq/L (ref 135–145)

## 2021-10-23 LAB — CBC
HCT: 39.4 % (ref 36.0–46.0)
Hemoglobin: 12.8 g/dL (ref 12.0–15.0)
MCHC: 32.6 g/dL (ref 30.0–36.0)
MCV: 86.4 fl (ref 78.0–100.0)
Platelets: 252 10*3/uL (ref 150.0–400.0)
RBC: 4.56 Mil/uL (ref 3.87–5.11)
RDW: 13.4 % (ref 11.5–15.5)
WBC: 3.8 10*3/uL — ABNORMAL LOW (ref 4.0–10.5)

## 2021-10-23 LAB — VITAMIN D 25 HYDROXY (VIT D DEFICIENCY, FRACTURES): VITD: 34.24 ng/mL (ref 30.00–100.00)

## 2021-10-24 LAB — OSMOLALITY, URINE: Osmolality, Ur: 835 mOsm/kg (ref 50–1200)

## 2021-10-24 LAB — POTASSIUM, URINE, RANDOM: Potassium Urine: 40 mmol/L (ref 12–129)

## 2021-10-24 LAB — SODIUM, URINE, RANDOM: Sodium, Ur: 225 mmol/L (ref 28–272)

## 2021-10-29 ENCOUNTER — Encounter: Payer: BC Managed Care – PPO | Admitting: Family Medicine

## 2021-10-30 ENCOUNTER — Other Ambulatory Visit: Payer: Self-pay | Admitting: Primary Care

## 2021-10-30 DIAGNOSIS — E875 Hyperkalemia: Secondary | ICD-10-CM

## 2021-11-02 LAB — OSMOLALITY: Osmolality: 300 mosm/kg (ref 278–305)

## 2021-11-02 LAB — ALDOSTERONE + RENIN ACTIVITY W/ RATIO
ALDO / PRA Ratio: 8.7 ratio (ref 0.9–28.9)
Aldosterone: 2 ng/dL
Renin Activity: 0.23 ng/mL/h — ABNORMAL LOW (ref 0.25–5.82)

## 2021-11-03 ENCOUNTER — Other Ambulatory Visit: Payer: BC Managed Care – PPO

## 2021-11-03 ENCOUNTER — Other Ambulatory Visit (INDEPENDENT_AMBULATORY_CARE_PROVIDER_SITE_OTHER): Payer: BC Managed Care – PPO

## 2021-11-03 ENCOUNTER — Other Ambulatory Visit: Payer: Self-pay

## 2021-11-03 DIAGNOSIS — E875 Hyperkalemia: Secondary | ICD-10-CM | POA: Diagnosis not present

## 2021-11-04 LAB — BASIC METABOLIC PANEL
BUN: 9 mg/dL (ref 6–23)
CO2: 29 mEq/L (ref 19–32)
Calcium: 11.6 mg/dL — ABNORMAL HIGH (ref 8.4–10.5)
Chloride: 106 mEq/L (ref 96–112)
Creatinine, Ser: 0.83 mg/dL (ref 0.40–1.20)
GFR: 83.05 mL/min (ref >60.00)
Glucose, Bld: 83 mg/dL (ref 70–99)
Potassium: 5.3 mEq/L — ABNORMAL HIGH (ref 3.5–5.1)
Sodium: 140 mEq/L (ref 135–145)

## 2021-11-05 ENCOUNTER — Encounter (HOSPITAL_BASED_OUTPATIENT_CLINIC_OR_DEPARTMENT_OTHER): Payer: Self-pay | Admitting: Cardiovascular Disease

## 2021-11-09 NOTE — Telephone Encounter (Signed)
Please advise 

## 2021-12-02 ENCOUNTER — Encounter: Payer: Self-pay | Admitting: Gastroenterology

## 2021-12-02 ENCOUNTER — Ambulatory Visit (AMBULATORY_SURGERY_CENTER): Payer: BC Managed Care – PPO | Admitting: Gastroenterology

## 2021-12-02 ENCOUNTER — Other Ambulatory Visit: Payer: Self-pay

## 2021-12-02 VITALS — BP 112/68 | HR 76 | Temp 97.1°F | Resp 14 | Ht 64.0 in | Wt 182.0 lb

## 2021-12-02 DIAGNOSIS — K635 Polyp of colon: Secondary | ICD-10-CM | POA: Diagnosis not present

## 2021-12-02 DIAGNOSIS — D123 Benign neoplasm of transverse colon: Secondary | ICD-10-CM

## 2021-12-02 DIAGNOSIS — K2970 Gastritis, unspecified, without bleeding: Secondary | ICD-10-CM

## 2021-12-02 DIAGNOSIS — K219 Gastro-esophageal reflux disease without esophagitis: Secondary | ICD-10-CM

## 2021-12-02 DIAGNOSIS — D122 Benign neoplasm of ascending colon: Secondary | ICD-10-CM

## 2021-12-02 DIAGNOSIS — D124 Benign neoplasm of descending colon: Secondary | ICD-10-CM

## 2021-12-02 DIAGNOSIS — Z1211 Encounter for screening for malignant neoplasm of colon: Secondary | ICD-10-CM

## 2021-12-02 DIAGNOSIS — K3189 Other diseases of stomach and duodenum: Secondary | ICD-10-CM | POA: Diagnosis not present

## 2021-12-02 MED ORDER — SODIUM CHLORIDE 0.9 % IV SOLN: 500.0000 mL | Freq: Once | INTRAVENOUS | Status: DC

## 2021-12-02 NOTE — Progress Notes (Signed)
[facility] History and Physical   Primary Care Physician:  Lesleigh Noe, MD   Reason for Procedure:   GERD, Colon cancer screening  Plan:    EGD and colonoscopy     HPI: Ana Freeman is a 50 y.o. female  here for colonoscopy screening first time exam. Patient denies any bowel symptoms at this time. Had ongoing reflux despite a few regimens, on protonix 20mg  BID and much better control of symptoms currently. No family history of colon cancer known. Otherwise feels well without any cardiopulmonary symptoms.    Past Medical History:  Diagnosis Date   Anemia    Brain lesion    Closed compression fracture of body of L1 vertebra (HCC) 07/06/2019   GERD (gastroesophageal reflux disease)    Hyperlipidemia 07/19/2018   Nondisplaced fracture of posterior process of right talus 07/09/2019   Numbness of lower extremity    Parathyroid abnormality [facility])     Past Surgical History:  Procedure Laterality Date   ANKLE CLOSED REDUCTION Right 07/05/2019   Procedure: Closed Reduction Ankle;  Surgeon: Hiram Gash, MD;  Location: [place];  Service: Orthopedics;  Laterality: Right;   COLONOSCOPY     pt denies    CYSTOSCOPY N/A 11/19/2020   Procedure: CYSTOSCOPY;  Surgeon: Everett Graff, MD;  Location: [facility];  Service: Gynecology;  Laterality: N/A;   DILATATION & CURETTAGE/HYSTEROSCOPY WITH MYOSURE N/A 10/24/2019   Procedure: DILATATION & CURETTAGE/HYSTEROSCOPY WITH MYOSURE;  Surgeon: Waymon Amato, MD;  Location: [place];  Service: Gynecology;  Laterality: N/A;   DILATION AND EVACUATION N/A 02/12/2016   Procedure: DILATATION AND EVACUATION WITH SUCTION ;  Surgeon: Eldred Manges, MD;  Location: [facility];  Service: Gynecology;  Laterality: N/A;   EXTERNAL FIXATION LEG Left 07/05/2019   Procedure: EXTERNAL FIXATION LEFT ANKLE;  Surgeon: Hiram Gash, MD;  Location: [place];  Service: Orthopedics;  Laterality: Left;   EXTERNAL FIXATION REMOVAL Left  07/19/2019   Procedure: REMOVAL EXTERNAL FIXATION LEG;  Surgeon: Shona Needles, MD;  Location: [place];  Service: Orthopedics;  Laterality: Left;   HYSTEROSCOPY WITH RESECTOSCOPE N/A 02/12/2016   Procedure: HYSTEROSCOPIC MYOMECTOMY WITH RESECTOSCOPE;  Surgeon: Eldred Manges, MD;  Location: [facility];  Service: Gynecology;  Laterality: N/A;   I & D EXTREMITY Left 07/05/2019   Procedure: IRRIGATION AND DEBRIDEMENT left ankle ;  Surgeon: Hiram Gash, MD;  Location: [place];  Service: Orthopedics;  Laterality: Left;   I & D EXTREMITY Left 07/08/2019   Procedure: IRRIGATION AND DEBRIDEMENT EXTREMITY WITH ADJUSTMENT OF EXTERNAL FIXATOR;  Surgeon: Shona Needles, MD;  Location: [place];  Service: Orthopedics;  Laterality: Left;   LAPAROSCOPIC VAGINAL HYSTERECTOMY WITH SALPINGECTOMY Bilateral 11/19/2020   Procedure: LAPAROSCOPIC ASSISTED VAGINAL HYSTERECTOMY WITH SALPINGO-OOPHARECTOMY;  Surgeon: Everett Graff, MD;  Location: [facility];  Service: Gynecology;  Laterality: Bilateral;   MASS EXCISION Right 08/30/2017   Procedure: EXCISION RIGHT UPPER ARM MASS;  Surgeon: Coralie Keens, MD;  Location: [place];  Service: General;  Laterality: Right;   MYOMECTOMY N/A 02/12/2016   Procedure: ABDOMINAL MYOMECTOMY;  Surgeon: Eldred Manges, MD;  Location: [place] [facility];  Service: Gynecology;  Laterality: N/A;   OPEN REDUCTION INTERNAL FIXATION (ORIF) TIBIA/FIBULA FRACTURE Left 07/19/2019   Procedure: OPEN REDUCTION INTERNAL FIXATION (ORIF) LEFT PILON;  Surgeon: Shona Needles, MD;  Location: [place];  Service: Orthopedics;  Laterality: Left;   ORIF ANKLE FRACTURE Right 07/08/2019   Procedure:  OPEN REDUCTION INTERNAL FIXATION (ORIF) ANKLE FRACTURE;  Surgeon: Shona Needles, MD;  Location: [place];  Service: Orthopedics;  Laterality: Right;   UPPER GI ENDOSCOPY     WISDOM TOOTH EXTRACTION      Prior to Admission medications   Medication Sig Start Date End Date Taking? Authorizing Provider   OVER THE COUNTER MEDICATION Take 1 tablet by mouth in the morning, at noon, and at bedtime. [facility]   Yes [provider]  pantoprazole (PROTONIX) 20 MG tablet Take 1 tablet (20 mg total) by mouth daily. 10/19/21  Yes Haunani Dickard, Carlota Raspberry, MD  estradiol (ESTRACE) 0.5 MG tablet Take 0.5 mg by mouth daily. 09/23/21   [provider]  Multiple Vitamins-Minerals (MULTIVITAMIN GUMMIES ADULT PO) Take 2 tablets by mouth daily.    [provider]  sodium zirconium cyclosilicate (LOKELMA) 5 g packet Take 10 g by mouth daily. 10/17/21   Lesleigh Noe, MD  valACYclovir (VALTREX) 500 MG tablet Take 1 tablet (500 mg total) by mouth daily. 11/21/20   Elby Beck, FNP    Current Outpatient Medications  Medication Sig Dispense Refill   OVER THE COUNTER MEDICATION Take 1 tablet by mouth in the morning, at noon, and at bedtime. [facility] [employer]     pantoprazole (PROTONIX) 20 MG tablet Take 1 tablet (20 mg total) by mouth daily. 30 tablet 3   estradiol (ESTRACE) 0.5 MG tablet Take 0.5 mg by mouth daily.     Multiple Vitamins-Minerals (MULTIVITAMIN GUMMIES ADULT PO) Take 2 tablets by mouth daily.     sodium zirconium cyclosilicate (LOKELMA) 5 g packet Take 10 g by mouth daily. 30 packet 0   valACYclovir (VALTREX) 500 MG tablet Take 1 tablet (500 mg total) by mouth daily. 90 tablet 0   Current Facility-Administered Medications  Medication Dose Route Frequency Provider Last Rate Last Admin   0.9 %  sodium chloride infusion  500 mL Intravenous Once Dresean Beckel, Carlota Raspberry, MD        Allergies as of 12/02/2021 - Review Complete 12/02/2021  Allergen Reaction Noted   Chlorhexidine gluconate Itching 11/19/2020    Family History  Problem Relation Age of Onset   Hypertension Mother    Diabetes Father    Congenital heart disease Father    Anemia Sister    Lung cancer Maternal Grandmother    Ovarian cancer Paternal Grandmother    Alzheimer's disease Paternal Grandfather     Colon cancer Neg Hx    Esophageal cancer Neg Hx    Rectal cancer Neg Hx    Stomach cancer Neg Hx     Social History   Socioeconomic History   Marital status: Married    Spouse name: Shawn   Number of children: 0   Years of education: Masters   Highest education level: Not on file  Occupational History   Not on file  Tobacco Use   Smoking status: Never   Smokeless tobacco: Never  Vaping Use   Vaping Use: Never used  Substance and Sexual Activity   Alcohol use: Yes    Alcohol/week: 0.0 standard drinks    Comment: 5 times a year   Drug use: Never   Sexual activity: Yes  Other Topics Concern   Not on file  Social History Narrative   10/28/20   From: [place] originally, moved in [place]: with husband, Shawn (2021)   Work: [employer] at [employer]       Family: mom is Solicitor, aunt,  sister, cousin nearby too      Enjoys: dance, Probation officer, eating      Exercise: 5 days a week - youtube dance video, [employer] weight lifting    Diet: eat anything, tries to eat healthy - on weight watchers       Safety   Seat belts: Yes    Guns: No   Safe in relationships: Yes    Social Determinants of Radio broadcast assistant Strain: Not on file  Food Insecurity: Not on file  Transportation Needs: Not on file  Physical Activity: Not on file  Stress: Not on file  Social Connections: Not on file  Intimate Partner Violence: Not on file    Review of Systems: All other review of systems negative except as mentioned in the HPI.  Physical Exam: Vital signs BP 118/73    Pulse 87    Temp (!) 97.1 F (36.2 C) (Skin)    Ht 5\' 4"  (1.626 m)    Wt 182 lb (82.6 kg)    LMP 11/05/2020    SpO2 100%    BMI 31.24 kg/m   General:   Alert,  Well-developed, pleasant and cooperative in NAD Lungs:  Clear throughout to auscultation.   Heart:  Regular rate and rhythm Abdomen:  Soft, nontender and nondistended.   Neuro/Psych:  Alert and cooperative. Normal mood and affect. A and O x  3  Jolly Mango, MD [facility]

## 2021-12-02 NOTE — Progress Notes (Signed)
VS by CW. ?

## 2021-12-02 NOTE — Op Note (Signed)
[place] Patient Name: Ana Freeman Procedure Date: 12/02/2021 3:55 PM MRN: [id number] Endoscopist: Remo Lipps P. Havery Moros , MD Age: 50 Referring MD:  Date of Birth: 11-May-1971 Gender: Female Account #: [id number] Procedure:                Upper GI endoscopy Indications:              Follow-up of gastro-esophageal reflux disease -                            longstanding symptoms eventually failed omeprazole                            40mg  / day, switched to protonix 20mg  twice daily                            and doing better on this regimen Medicines:                Monitored Anesthesia Care Procedure:                Pre-Anesthesia Assessment:                           - Prior to the procedure, a History and Physical                            was performed, and patient medications and                            allergies were reviewed. The patient's tolerance of                            previous anesthesia was also reviewed. The risks                            and benefits of the procedure and the sedation                            options and risks were discussed with the patient.                            All questions were answered, and informed consent                            was obtained. Prior Anticoagulants: The patient has                            taken no previous anticoagulant or antiplatelet                            agents. ASA Grade Assessment: III - A patient with                            severe systemic disease. After reviewing the risks  and benefits, the patient was deemed in                            satisfactory condition to undergo the procedure.                           After obtaining informed consent, the endoscope was                            passed under direct vision. Throughout the                            procedure, the patient's blood pressure, pulse, and                            oxygen  saturations were monitored continuously. The                            GIF [id number] #[id number] was introduced through the                            mouth, and advanced to the second part of duodenum.                            The upper GI endoscopy was accomplished without                            difficulty. The patient tolerated the procedure                            well. Scope In: Scope Out: Findings:                 Esophagogastric landmarks were identified: the                            Z-line was found at 36 cm, the gastroesophageal                            junction was found at 36 cm and the upper extent of                            the gastric folds was found at 36 cm from the                            incisors.                           The exam of the esophagus was otherwise normal. No                            Barrett's                           Patchy erythematous mucosa was found in the  proximal gastric antrum.                           The exam of the stomach was otherwise normal.                           Biopsies were taken with a cold forceps in the                            gastric body, at the incisura and in the gastric                            antrum for Helicobacter pylori testing.                           The duodenal bulb and second portion of the                            duodenum were normal. Complications:            No immediate complications. Estimated blood loss:                            Minimal. Estimated Blood Loss:     Estimated blood loss was minimal. Impression:               - Esophagogastric landmarks identified.                           - Normal esophagus otherwise                           - Erythematous mucosa in the antrum.                           - Normal stomach otherwise - biopsies taken to rule                            out H pylori                           - Normal duodenal bulb and second  portion of the                            duodenum. Recommendation:           - Patient has a contact number available for                            emergencies. The signs and symptoms of potential                            delayed complications were discussed with the                            patient. Return to normal activities tomorrow.  Written discharge instructions were provided to the                            patient.                           - Resume previous diet.                           - Continue present medications.                           - Await pathology results. Remo Lipps P. Gene Colee, MD 12/02/2021 4:37:28 PM This report has been signed electronically.

## 2021-12-02 NOTE — Op Note (Signed)
[place] Patient Name: Shervon Kerwin Procedure Date: 12/02/2021 3:54 PM MRN: [id number] Endoscopist: Remo Lipps P. Havery Moros , MD Age: 50 Referring MD:  Date of Birth: 1971-04-21 Gender: Female Account #: [id number] Procedure:                Colonoscopy Indications:              Screening for colorectal malignant neoplasm, This                            is the patient's first colonoscopy Medicines:                Monitored Anesthesia Care Procedure:                Pre-Anesthesia Assessment:                           - Prior to the procedure, a History and Physical                            was performed, and patient medications and                            allergies were reviewed. The patient's tolerance of                            previous anesthesia was also reviewed. The risks                            and benefits of the procedure and the sedation                            options and risks were discussed with the patient.                            All questions were answered, and informed consent                            was obtained. Prior Anticoagulants: The patient has                            taken no previous anticoagulant or antiplatelet                            agents. ASA Grade Assessment: III - A patient with                            severe systemic disease. After reviewing the risks                            and benefits, the patient was deemed in                            satisfactory condition to undergo the procedure.  After obtaining informed consent, the colonoscope                            was passed under direct vision. Throughout the                            procedure, the patient's blood pressure, pulse, and                            oxygen saturations were monitored continuously. The                            CF [id number] #[id number] was introduced through the anus                            and advanced  to the the cecum, identified by                            appendiceal orifice and ileocecal valve. The                            colonoscopy was performed without difficulty. The                            patient tolerated the procedure well. The quality                            of the bowel preparation was good. The ileocecal                            valve, appendiceal orifice, and rectum were                            photographed. Scope In: 4:12:45 PM Scope Out: 4:27:00 PM Scope Withdrawal Time: 0 hours 10 minutes 18 seconds  Total Procedure Duration: 0 hours 14 minutes 15 seconds  Findings:                 The perianal and digital rectal examinations were                            normal.                           A 3 to 4 mm polyp was found in the ascending colon.                            The polyp was flat. The polyp was removed with a                            cold snare. Resection and retrieval were complete.                           A 4 to 5 mm polyp was found in the splenic  flexure.                            The polyp was sessile. The polyp was removed with a                            cold snare. Resection and retrieval were complete.                           A 4 mm polyp was found in the descending colon. The                            polyp was flat. The polyp was removed with a cold                            snare. Resection and retrieval were complete.                           Internal hemorrhoids were found during                            retroflexion. The hemorrhoids were small.                           Anal papilla(e) were hypertrophied.                           The exam was otherwise without abnormality. Complications:            No immediate complications. Estimated blood loss:                            Minimal. Estimated Blood Loss:     Estimated blood loss was minimal. Impression:               - One 3 to 4 mm polyp in the ascending colon,                             removed with a cold snare. Resected and retrieved.                           - One 4 to 5 mm polyp at the splenic flexure,                            removed with a cold snare. Resected and retrieved.                           - One 4 mm polyp in the descending colon, removed                            with a cold snare. Resected and retrieved.                           - Internal hemorrhoids.                           -  Anal papilla(e) were hypertrophied.                           - The examination was otherwise normal. Recommendation:           - Patient has a contact number available for                            emergencies. The signs and symptoms of potential                            delayed complications were discussed with the                            patient. Return to normal activities tomorrow.                            Written discharge instructions were provided to the                            patient.                           - Resume previous diet.                           - Continue present medications.                           - Await pathology results. Remo Lipps P. Jefferey Lippmann, MD 12/02/2021 4:33:07 PM This report has been signed electronically.

## 2021-12-02 NOTE — Progress Notes (Signed)
Called to room to assist during endoscopic procedure.  Patient ID and intended procedure confirmed with present staff. Received instructions for my participation in the procedure from the performing physician.  

## 2021-12-02 NOTE — Progress Notes (Signed)
To PACU, VSS. Report to [email or web address] 

## 2021-12-02 NOTE — Patient Instructions (Signed)
Handouts given on Polyps and hemorrhoids.  YOU HAD AN ENDOSCOPIC PROCEDURE TODAY AT [facility]:   Refer to the procedure report that was given to you for any specific questions about what was found during the examination.  If the procedure report does not answer your questions, please call your gastroenterologist to clarify.  If you requested that your care partner not be given the details of your procedure findings, then the procedure report has been included in a sealed envelope for you to review at your convenience later.  YOU SHOULD EXPECT: Some feelings of bloating in the abdomen. Passage of more gas than usual.  Walking can help get rid of the air that was put into your GI tract during the procedure and reduce the bloating. If you had a lower endoscopy (such as a colonoscopy or flexible sigmoidoscopy) you may notice spotting of blood in your stool or on the toilet paper. If you underwent a bowel prep for your procedure, you may not have a normal bowel movement for a few days.  Please Note:  You might notice some irritation and congestion in your nose or some drainage.  This is from the oxygen used during your procedure.  There is no need for concern and it should clear up in a day or so.  SYMPTOMS TO REPORT IMMEDIATELY:  Following lower endoscopy (colonoscopy or flexible sigmoidoscopy):  Excessive amounts of blood in the stool  Significant tenderness or worsening of abdominal pains  Swelling of the abdomen that is new, acute  Fever of 100F or higher  Following upper endoscopy (EGD)  Vomiting of blood or coffee ground material  New chest pain or pain under the shoulder blades  Painful or persistently difficult swallowing  New shortness of breath  Fever of 100F or higher  Black, tarry-looking stools  For urgent or emergent issues, a gastroenterologist can be reached at any hour by calling [phone]. Do not use MyChart messaging for urgent concerns.    DIET:   We do recommend a small meal at first, but then you may proceed to your regular diet.  Drink plenty of fluids but you should avoid alcoholic beverages for 24 hours.  ACTIVITY:  You should plan to take it easy for the rest of today and you should NOT DRIVE or use heavy machinery until tomorrow (because of the sedation medicines used during the test).    FOLLOW UP: Our staff will call the number listed on your records 48-72 hours following your procedure to check on you and address any questions or concerns that you may have regarding the information given to you following your procedure. If we do not reach you, we will leave a message.  We will attempt to reach you two times.  During this call, we will ask if you have developed any symptoms of COVID 19. If you develop any symptoms (ie: fever, flu-like symptoms, shortness of breath, cough etc.) before then, please call [phone].  If you test positive for Covid 19 in the 2 weeks post procedure, please call and report this information to [place].    If any biopsies were taken you will be contacted by phone or by letter within the next 1-3 weeks.  Please call us at [phone] if you have not heard about the biopsies in 3 weeks.    SIGNATURES/CONFIDENTIALITY: You and/or your care partner have signed paperwork which will be entered into your electronic medical record.  These signatures attest to the fact that  that the information above on your After Visit Summary has been reviewed and is understood.  Full responsibility of the confidentiality of this discharge information lies with you and/or your care-partner.

## 2021-12-04 ENCOUNTER — Telehealth: Payer: Self-pay

## 2021-12-04 NOTE — Telephone Encounter (Signed)
°  Follow up Call-  Call back number 12/02/2021  Post procedure Call Back phone  # [phone]  Permission to leave phone message Yes  Some recent data might be hidden     Patient questions:  Do you have a fever, pain , or abdominal swelling? No. Pain Score  0 *  Have you tolerated food without any problems? Yes.    Have you been able to return to your normal activities? Yes.    Do you have any questions about your discharge instructions: Diet   No. Medications  No. Follow up visit  No.  Do you have questions or concerns about your Care? No.  Actions: * If pain score is 4 or above: No action needed, pain <4.  Have you developed a fever since your procedure? no  2.   Have you had an respiratory symptoms (SOB or cough) since your procedure? no  3.   Have you tested positive for COVID 19 since your procedure no  4.   Have you had any family members/close contacts diagnosed with the COVID 19 since your procedure?  no   If yes to any of these questions please route to Joylene John, RN and Joella Prince, RN

## 2021-12-10 ENCOUNTER — Encounter (HOSPITAL_BASED_OUTPATIENT_CLINIC_OR_DEPARTMENT_OTHER): Payer: Self-pay | Admitting: Cardiovascular Disease
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
## 2021-12-11 ENCOUNTER — Ambulatory Visit (HOSPITAL_BASED_OUTPATIENT_CLINIC_OR_DEPARTMENT_OTHER): Payer: BC Managed Care – PPO | Admitting: Cardiovascular Disease

## 2021-12-11 ENCOUNTER — Other Ambulatory Visit: Payer: Self-pay

## 2021-12-11 ENCOUNTER — Encounter (HOSPITAL_BASED_OUTPATIENT_CLINIC_OR_DEPARTMENT_OTHER): Payer: Self-pay | Admitting: Cardiovascular Disease

## 2021-12-11 VITALS — BP 102/60 | HR 74 | Ht 64.0 in | Wt 188.2 lb

## 2021-12-11 DIAGNOSIS — E7800 Pure hypercholesterolemia, unspecified: Secondary | ICD-10-CM | POA: Diagnosis not present

## 2021-12-11 DIAGNOSIS — R072 Precordial pain: Secondary | ICD-10-CM | POA: Diagnosis not present

## 2021-12-11 DIAGNOSIS — K219 Gastro-esophageal reflux disease without esophagitis: Secondary | ICD-10-CM | POA: Diagnosis not present

## 2021-12-11 DIAGNOSIS — R079 Chest pain, unspecified: Secondary | ICD-10-CM | POA: Diagnosis not present

## 2021-12-11 LAB — BASIC METABOLIC PANEL
BUN/Creatinine Ratio: 16 (ref 9–23)
BUN: 15 mg/dL (ref 6–24)
CO2: 22 mmol/L (ref 20–29)
Calcium: 11.8 mg/dL — ABNORMAL HIGH (ref 8.7–10.2)
Chloride: 103 mmol/L (ref 96–106)
Creatinine, Ser: 0.93 mg/dL (ref 0.57–1.00)
Glucose: 93 mg/dL (ref 70–99)
Potassium: 5 mmol/L (ref 3.5–5.2)
Sodium: 138 mmol/L (ref 134–144)
eGFR: 75 mL/min/{1.73_m2} (ref >59)

## 2021-12-11 MED ORDER — IVABRADINE HCL 5 MG PO TABS: 5.0000 mg | ORAL_TABLET | Freq: Once | ORAL | 0 refills | Status: AC

## 2021-12-11 NOTE — Patient Instructions (Signed)
Medication Instructions:  PLEASE TAKE IVABRADINE 5mg  TWO HOURS PRIOR TO CCTA SCAN  *If you need a refill on your cardiac medications before your next appointment, please call your pharmacy*  Lab Work: BMET- TODAY  If you have labs (blood work) drawn today and your tests are completely normal, you will receive your results only by: [place] (if you have MyChart) OR A paper copy in the mail If you have any lab test that is abnormal or we need to change your treatment, we will call you to review the results.  Testing/Procedures:   Your cardiac CT will be scheduled at one of the below locations:   [facility] [address] [place], [place] [address] [phone]  If scheduled at [facility], please arrive at the [facility] main entrance (entrance A) of [place] 30 minutes prior to test start time. You can use the FREE valet parking offered at the main entrance (encouraged to control the heart rate for the test) Proceed to the [facility] Radiology Department (first floor) to check-in and test prep.  Please follow these instructions carefully (unless otherwise directed):  On the Night Before the Test: Be sure to Drink plenty of water. Do not consume any caffeinated/decaffeinated beverages or chocolate 12 hours prior to your test. Do not take any antihistamines 12 hours prior to your test.  On the Day of the Test: Drink plenty of water until 1 hour prior to the test. Do not eat any food 4 hours prior to the test. You may take your regular medications prior to the test.  PLEASE TAKE IVABRADINE 5mg  TWO HOURS PRIOR TO TEST HOLD Furosemide/Hydrochlorothiazide morning of the test. FEMALES- please wear underwire-free bra if available, avoid dresses & tight clothing      After the Test: Drink plenty of water. After receiving IV contrast, you may experience a mild flushed feeling. This is normal. On occasion, you may experience a mild rash up to  24 hours after the test. This is not dangerous. If this occurs, you can take Benadryl 25 mg and increase your fluid intake. If you experience trouble breathing, this can be serious. If it is severe call 911 IMMEDIATELY. If it is mild, please call our office. If you take any of these medications: Glipizide/Metformin, Avandament, Glucavance, please do not take 48 hours after completing test unless otherwise instructed.  Please allow 2-4 weeks for scheduling of routine cardiac CTs. Some insurance companies require a pre-authorization which may delay scheduling of this test.   For non-scheduling related questions, please contact the cardiac imaging nurse navigator should you have any questions/concerns: Marchia Bond, Cardiac Imaging Nurse Navigator Gordy Clement, Cardiac Imaging Nurse Navigator [facility] Direct Office Dial: [phone]   For scheduling needs, including cancellations and rescheduling, please call [place], [phone].  Follow-Up: At [facility], you and your health needs are our priority.  As part of our continuing mission to provide you with exceptional heart care, we have created designated Provider Care Teams.  These Care Teams include your primary Cardiologist (physician) and Advanced Practice Providers (APPs -  Physician Assistants and Nurse Practitioners) who all work together to provide you with the care you need, when you need it.  Your next appointment:   AS NEEDED   The format for your next appointment:   In Person  Provider:   Skeet Latch, MD

## 2021-12-11 NOTE — Assessment & Plan Note (Signed)
Lipids are elevated but her ASCVD 10-year risk is low.  Therefore no statin is indicated at this time.

## 2021-12-11 NOTE — Assessment & Plan Note (Signed)
Symptoms are reportedly well-controlled on pantoprazole.  She had an EGD last week with Dr. Havery Moros that she reports was unremarkable other than some red spots in her stomach.  She does not think they saw any signs of esophagitis.  I am unable to view that report at this time.

## 2021-12-11 NOTE — Assessment & Plan Note (Addendum)
Ana Freeman is experiencing chest pain that is worse when lying down but persist throughout the days.  Given that it has been ongoing for several days and there is no exertional component, I do not think this is ischemic.  She had a chest CT in 2020 that did not reveal any evidence of coronary calcification.  She does have hyperlipidemia but no other risk factors.  There is no sign of prior infarct on her EKG.  Given that she is had ongoing chest pain off and on for years, we will get a coronary CTA to rule out obstructive coronary disease.  Given that her blood pressure is low we will give her ivabradine 5 mg prior to her study instead of metoprolol.
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
## 2021-12-21 ENCOUNTER — Other Ambulatory Visit (HOSPITAL_COMMUNITY): Payer: Self-pay | Admitting: Otolaryngology

## 2021-12-21 ENCOUNTER — Other Ambulatory Visit: Payer: Self-pay | Admitting: Otolaryngology

## 2021-12-21 DIAGNOSIS — D351 Benign neoplasm of parathyroid gland: Secondary | ICD-10-CM

## 2021-12-24 ENCOUNTER — Telehealth (HOSPITAL_COMMUNITY): Payer: Self-pay | Admitting: *Deleted

## 2021-12-24 NOTE — Telephone Encounter (Signed)
Returning a phone call to patient regarding cardiac CT. Patient wishes to cancel scan at this time due to cost. She is aware that she can call back to schedule if she is able to afford the scan and that I will let Dr. Oval Linsey know.  Gordy Clement RN Navigator Cardiac Imaging [facility] [phone] Office [phone] Cell

## 2021-12-24 NOTE — Telephone Encounter (Signed)
Reaching out to patient to offer assistance regarding upcoming cardiac imaging study; pt verbalizes understanding of appt date/time, parking situation and where to check in, pre-test NPO status and medications ordered, and verified current allergies; name and call back number provided for further questions should they arise  Gordy Clement RN Navigator Cardiac Imaging [facility] [phone] office [phone] cell  Patient to take 5mg  ivabradine two hours prior to cardiac CT scan. She is aware to arrive at 7:15am for her 7:45am scan.

## 2021-12-25 ENCOUNTER — Encounter (HOSPITAL_COMMUNITY): Payer: Self-pay

## 2021-12-25 ENCOUNTER — Ambulatory Visit (HOSPITAL_COMMUNITY): Admission: RE | Admit: 2021-12-25 | Payer: BC Managed Care – PPO | Source: Ambulatory Visit

## 2022-01-01 ENCOUNTER — Encounter
Admission: RE | Admit: 2022-01-01 | Discharge: 2022-01-01 | Disposition: A | Discharge status: Home / Self Care | Payer: BC Managed Care – PPO | Source: Ambulatory Visit | Attending: Otolaryngology | Admitting: Otolaryngology

## 2022-01-01 DIAGNOSIS — D351 Benign neoplasm of parathyroid gland: Secondary | ICD-10-CM | POA: Diagnosis not present

## 2022-01-01 MED ORDER — TECHNETIUM TC 99M SESTAMIBI GENERIC - CARDIOLITE
25.0000 | Freq: Once | INTRAVENOUS | Status: AC
  Administered 2022-01-01: 24.21 via INTRAVENOUS

## 2022-01-04 ENCOUNTER — Ambulatory Visit: Payer: BC Managed Care – PPO | Attending: Student

## 2022-01-04 ENCOUNTER — Other Ambulatory Visit: Payer: Self-pay

## 2022-01-04 ENCOUNTER — Ambulatory Visit: Payer: BC Managed Care – PPO | Admitting: Physical Therapy

## 2022-01-04 DIAGNOSIS — M6281 Muscle weakness (generalized): Secondary | ICD-10-CM | POA: Insufficient documentation

## 2022-01-04 DIAGNOSIS — M25662 Stiffness of left knee, not elsewhere classified: Secondary | ICD-10-CM | POA: Insufficient documentation
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
## 2022-01-04 NOTE — Addendum Note (Signed)
Addended by: Etta Grandchild on: 01/04/2022 06:03 PM   Modules accepted: Orders

## 2022-01-06 ENCOUNTER — Ambulatory Visit: Payer: BC Managed Care – PPO | Admitting: Physical Therapy

## 2022-01-06 ENCOUNTER — Other Ambulatory Visit: Payer: Self-pay

## 2022-01-06 ENCOUNTER — Ambulatory Visit: Payer: BC Managed Care – PPO

## 2022-01-06 DIAGNOSIS — M25662 Stiffness of left knee, not elsewhere classified: Secondary | ICD-10-CM | POA: Diagnosis not present

## 2022-01-06 DIAGNOSIS — M6281 Muscle weakness (generalized): Secondary | ICD-10-CM
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
## 2022-01-11 ENCOUNTER — Ambulatory Visit: Payer: BC Managed Care – PPO | Admitting: Physical Therapy

## 2022-01-13 ENCOUNTER — Ambulatory Visit: Payer: BC Managed Care – PPO | Admitting: Physical Therapy

## 2022-01-13 ENCOUNTER — Other Ambulatory Visit: Payer: Self-pay

## 2022-01-13 ENCOUNTER — Ambulatory Visit: Payer: BC Managed Care – PPO | Attending: Student

## 2022-01-13 DIAGNOSIS — M6281 Muscle weakness (generalized): Secondary | ICD-10-CM | POA: Insufficient documentation

## 2022-01-13 DIAGNOSIS — M25662 Stiffness of left knee, not elsewhere classified: Secondary | ICD-10-CM | POA: Diagnosis not present

## 2022-01-13 DIAGNOSIS — R262 Difficulty in walking, not elsewhere classified: Secondary | ICD-10-CM | POA: Insufficient documentation
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
## 2022-01-25 ENCOUNTER — Ambulatory Visit: Payer: BC Managed Care – PPO | Admitting: Physical Therapy

## 2022-01-27 ENCOUNTER — Ambulatory Visit: Payer: BC Managed Care – PPO | Admitting: Physical Therapy

## 2022-02-01 ENCOUNTER — Ambulatory Visit: Payer: BC Managed Care – PPO | Admitting: Physical Therapy

## 2022-02-03 ENCOUNTER — Ambulatory Visit: Payer: BC Managed Care – PPO

## 2022-02-03 ENCOUNTER — Other Ambulatory Visit: Payer: Self-pay

## 2022-02-03 DIAGNOSIS — M6281 Muscle weakness (generalized): Secondary | ICD-10-CM

## 2022-02-03 DIAGNOSIS — M25662 Stiffness of left knee, not elsewhere classified: Secondary | ICD-10-CM
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
## 2022-02-04 ENCOUNTER — Other Ambulatory Visit: Payer: Self-pay | Admitting: Gastroenterology

## 2022-02-08 ENCOUNTER — Ambulatory Visit: Payer: BC Managed Care – PPO | Admitting: Physical Therapy

## 2022-02-08 ENCOUNTER — Encounter: Payer: BC Managed Care – PPO | Admitting: Physical Therapy

## 2022-02-08 ENCOUNTER — Other Ambulatory Visit: Payer: Self-pay

## 2022-02-08 DIAGNOSIS — R262 Difficulty in walking, not elsewhere classified: Secondary | ICD-10-CM

## 2022-02-08 DIAGNOSIS — M6281 Muscle weakness (generalized): Secondary | ICD-10-CM

## 2022-02-08 DIAGNOSIS — M25662 Stiffness of left knee, not elsewhere classified: Secondary | ICD-10-CM | POA: Diagnosis not present
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
## 2022-02-10 ENCOUNTER — Other Ambulatory Visit: Payer: Self-pay

## 2022-02-10 ENCOUNTER — Ambulatory Visit: Payer: BC Managed Care – PPO | Admitting: Physical Therapy

## 2022-02-10 ENCOUNTER — Ambulatory Visit: Payer: BC Managed Care – PPO | Attending: Student

## 2022-02-10 DIAGNOSIS — M25662 Stiffness of left knee, not elsewhere classified: Secondary | ICD-10-CM | POA: Diagnosis present

## 2022-02-10 DIAGNOSIS — M6281 Muscle weakness (generalized): Secondary | ICD-10-CM | POA: Insufficient documentation

## 2022-02-10 DIAGNOSIS — R262 Difficulty in walking, not elsewhere classified: Secondary | ICD-10-CM | POA: Diagnosis not present
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
## 2022-02-15 ENCOUNTER — Encounter: Payer: BC Managed Care – PPO | Admitting: Physical Therapy

## 2022-02-15 ENCOUNTER — Ambulatory Visit: Payer: BC Managed Care – PPO | Admitting: Physical Therapy

## 2022-02-15 ENCOUNTER — Encounter: Payer: Self-pay | Admitting: Physical Therapy

## 2022-02-15 ENCOUNTER — Other Ambulatory Visit: Payer: Self-pay

## 2022-02-15 DIAGNOSIS — R262 Difficulty in walking, not elsewhere classified: Secondary | ICD-10-CM

## 2022-02-15 DIAGNOSIS — M25662 Stiffness of left knee, not elsewhere classified: Secondary | ICD-10-CM

## 2022-02-15 DIAGNOSIS — M6281 Muscle weakness (generalized): Secondary | ICD-10-CM
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
## 2022-02-17 ENCOUNTER — Ambulatory Visit: Payer: BC Managed Care – PPO | Admitting: Physical Therapy

## 2022-02-22 ENCOUNTER — Ambulatory Visit: Payer: BC Managed Care – PPO | Admitting: Physical Therapy

## 2022-02-22 ENCOUNTER — Encounter: Payer: Self-pay | Admitting: Family Medicine

## 2022-02-22 ENCOUNTER — Encounter: Payer: BC Managed Care – PPO | Admitting: Physical Therapy

## 2022-02-22 ENCOUNTER — Other Ambulatory Visit: Payer: Self-pay

## 2022-02-22 ENCOUNTER — Telehealth (INDEPENDENT_AMBULATORY_CARE_PROVIDER_SITE_OTHER): Payer: BC Managed Care – PPO | Admitting: Family Medicine

## 2022-02-22 VITALS — Wt 186.0 lb

## 2022-02-22 DIAGNOSIS — M6281 Muscle weakness (generalized): Secondary | ICD-10-CM

## 2022-02-22 DIAGNOSIS — E875 Hyperkalemia: Secondary | ICD-10-CM | POA: Diagnosis not present

## 2022-02-22 DIAGNOSIS — D351 Benign neoplasm of parathyroid gland: Secondary | ICD-10-CM

## 2022-02-22 DIAGNOSIS — M25662 Stiffness of left knee, not elsewhere classified: Secondary | ICD-10-CM

## 2022-02-22 DIAGNOSIS — R262 Difficulty in walking, not elsewhere classified: Secondary | ICD-10-CM | POA: Diagnosis not present

## 2022-02-22 DIAGNOSIS — E7800 Pure hypercholesterolemia, unspecified: Secondary | ICD-10-CM

## 2022-02-22 NOTE — Addendum Note (Signed)
Addended by: Waunita Schooner R on: 02/22/2022 01:34 PM ? ? Modules accepted: Orders ? ?

## 2022-02-22 NOTE — Assessment & Plan Note (Signed)
Patient will return for fasting cholesterol, not currently needing medication continue lifestyle modification. ? ?
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
## 2022-02-22 NOTE — Assessment & Plan Note (Signed)
Numbers are improving from last fall, has seen nephrology.  Recheck ?

## 2022-02-22 NOTE — Assessment & Plan Note (Signed)
Patient with hypercalcemia likely secondary to parathyroid adenoma.  She has seen ear nose and throat which recommended surgical removal, but she is not interested in surgery at this time.  Discussed repeating calcium as well as signs for hypercalcemia.  E consult placed to endocrinology to see if there are any other possible treatment options at this time.  We will consider referral to an ear nose and throat in [place] if surgery seems to be the only option.  ?

## 2022-02-24 ENCOUNTER — Ambulatory Visit: Payer: BC Managed Care – PPO | Admitting: Physical Therapy

## 2022-02-24 ENCOUNTER — Encounter: Payer: BC Managed Care – PPO | Admitting: Physical Therapy

## 2022-02-24 ENCOUNTER — Other Ambulatory Visit: Payer: Self-pay

## 2022-02-24 DIAGNOSIS — R262 Difficulty in walking, not elsewhere classified: Secondary | ICD-10-CM

## 2022-02-24 DIAGNOSIS — M25662 Stiffness of left knee, not elsewhere classified: Secondary | ICD-10-CM

## 2022-02-24 DIAGNOSIS — M6281 Muscle weakness (generalized): Secondary | ICD-10-CM
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
## 2022-02-26 ENCOUNTER — Ambulatory Visit: Admit: 2022-02-26 | Payer: BC Managed Care – PPO | Admitting: Otolaryngology

## 2022-02-26 SURGERY — EXCISION, PAROTID GLAND
Anesthesia: General | Laterality: Left

## 2022-03-01 ENCOUNTER — Ambulatory Visit: Payer: BC Managed Care – PPO | Admitting: Physical Therapy

## 2022-03-01 ENCOUNTER — Encounter: Payer: BC Managed Care – PPO | Admitting: Physical Therapy

## 2022-03-02 ENCOUNTER — Encounter: Payer: Self-pay | Admitting: *Deleted

## 2022-03-03 ENCOUNTER — Other Ambulatory Visit: Payer: Self-pay

## 2022-03-03 ENCOUNTER — Ambulatory Visit: Payer: BC Managed Care – PPO | Admitting: Physical Therapy

## 2022-03-03 DIAGNOSIS — M6281 Muscle weakness (generalized): Secondary | ICD-10-CM

## 2022-03-03 DIAGNOSIS — R262 Difficulty in walking, not elsewhere classified: Secondary | ICD-10-CM | POA: Diagnosis not present

## 2022-03-03 DIAGNOSIS — M25662 Stiffness of left knee, not elsewhere classified: Secondary | ICD-10-CM
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
## 2022-03-04 ENCOUNTER — Encounter: Payer: Self-pay | Admitting: Physical Therapy

## 2022-03-04 ENCOUNTER — Other Ambulatory Visit (INDEPENDENT_AMBULATORY_CARE_PROVIDER_SITE_OTHER): Payer: BC Managed Care – PPO

## 2022-03-04 DIAGNOSIS — E875 Hyperkalemia: Secondary | ICD-10-CM

## 2022-03-04 DIAGNOSIS — D351 Benign neoplasm of parathyroid gland: Secondary | ICD-10-CM

## 2022-03-04 DIAGNOSIS — E7800 Pure hypercholesterolemia, unspecified: Secondary | ICD-10-CM

## 2022-03-04 LAB — BASIC METABOLIC PANEL
BUN: 11 mg/dL (ref 6–23)
CO2: 28 mEq/L (ref 19–32)
Calcium: 11 mg/dL — ABNORMAL HIGH (ref 8.4–10.5)
Chloride: 107 mEq/L (ref 96–112)
Creatinine, Ser: 0.93 mg/dL (ref 0.40–1.20)
GFR: 72.29 mL/min (ref >60.00)
Glucose, Bld: 99 mg/dL (ref 70–99)
Potassium: 4.7 mEq/L (ref 3.5–5.1)
Sodium: 141 mEq/L (ref 135–145)

## 2022-03-04 LAB — LIPID PANEL
Cholesterol: 198 mg/dL (ref 0–200)
HDL: 59.2 mg/dL (ref >39.00)
LDL Cholesterol: 128 mg/dL — ABNORMAL HIGH (ref 0–99)
NonHDL: 138.63
Total CHOL/HDL Ratio: 3
Triglycerides: 55 mg/dL (ref 0.0–149.0)
VLDL: 11 mg/dL (ref 0.0–40.0)

## 2022-03-05 LAB — PTH, INTACT AND CALCIUM
Calcium: 11.3 mg/dL — ABNORMAL HIGH (ref 8.6–10.2)
PTH: 160 pg/mL — ABNORMAL HIGH (ref 16–77)

## 2022-03-08 ENCOUNTER — Encounter: Payer: BC Managed Care – PPO | Admitting: Physical Therapy

## 2022-03-08 ENCOUNTER — Telehealth: Payer: Self-pay | Admitting: Physical Therapy

## 2022-03-08 ENCOUNTER — Ambulatory Visit: Payer: BC Managed Care – PPO | Admitting: Physical Therapy

## 2022-03-08 NOTE — Telephone Encounter (Signed)
Pt reports that because of issues with her insurance, she can no longer attend PT. PT explained how she can continue to progress exercises and provided PT a return to running protocol via email. Pt also instructed to return if she is able to figure out insurance.  ?

## 2022-03-10 ENCOUNTER — Ambulatory Visit: Payer: BC Managed Care – PPO | Admitting: Physical Therapy

## 2022-03-30 ENCOUNTER — Ambulatory Visit: Payer: BC Managed Care – PPO | Admitting: Internal Medicine

## 2022-03-30 ENCOUNTER — Encounter: Payer: Self-pay | Admitting: Internal Medicine

## 2022-03-30 VITALS — BP 108/66 | HR 71 | Ht 64.0 in | Wt 189.0 lb

## 2022-03-30 DIAGNOSIS — Z780 Asymptomatic menopausal state: Secondary | ICD-10-CM

## 2022-03-30 DIAGNOSIS — E210 Primary hyperparathyroidism: Secondary | ICD-10-CM

## 2022-03-30 DIAGNOSIS — E042 Nontoxic multinodular goiter: Secondary | ICD-10-CM | POA: Diagnosis not present

## 2022-03-30 LAB — COMPREHENSIVE METABOLIC PANEL
ALT: 25 U/L (ref 0–35)
AST: 20 U/L (ref 0–37)
Albumin: 4.3 g/dL (ref 3.5–5.2)
Alkaline Phosphatase: 131 U/L — ABNORMAL HIGH (ref 39–117)
BUN: 14 mg/dL (ref 6–23)
CO2: 30 mEq/L (ref 19–32)
Calcium: 11.9 mg/dL — ABNORMAL HIGH (ref 8.4–10.5)
Chloride: 106 mEq/L (ref 96–112)
Creatinine, Ser: 0.86 mg/dL (ref 0.40–1.20)
GFR: 79.36 mL/min (ref >60.00)
Glucose, Bld: 93 mg/dL (ref 70–99)
Potassium: 5.5 mEq/L — ABNORMAL HIGH (ref 3.5–5.1)
Sodium: 141 mEq/L (ref 135–145)
Total Bilirubin: 0.6 mg/dL (ref 0.2–1.2)
Total Protein: 7.3 g/dL (ref 6.0–8.3)

## 2022-03-30 LAB — TSH: TSH: 1.04 u[IU]/mL (ref 0.35–5.50)

## 2022-03-30 LAB — T4, FREE: Free T4: 0.97 ng/dL (ref 0.60–1.60)

## 2022-03-30 LAB — VITAMIN D 25 HYDROXY (VIT D DEFICIENCY, FRACTURES): VITD: 32.98 ng/mL (ref 30.00–100.00)

## 2022-03-30 NOTE — Patient Instructions (Addendum)
-   Stay Hydrated ?- Avoid over the counter calcium  ?- Consume 2-3 servings of dietary calcium daily  ? ? ? ? ?24-Hour Urine Collection ? ?You will be collecting your urine for a 24-hour period of time. ?Your timer starts with your first urine of the morning (For example - If you first pee at [place], your timer will start at [place]) ?Throw away your first urine of the morning ?Collect your urine every time you pee for the next 24 hours ?STOP your urine collection 24 hours after you started the collection (For example - You would stop at 9AM the day after you started) ? ?
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
## 2022-03-31 LAB — CALCIUM, IONIZED: Calcium, Ion: 6 mg/dL — ABNORMAL HIGH (ref 4.7–5.5)

## 2022-03-31 LAB — PARATHYROID HORMONE, INTACT (NO CA): PTH: 128 pg/mL — ABNORMAL HIGH (ref 16–77)

## 2022-04-02 ENCOUNTER — Ambulatory Visit (INDEPENDENT_AMBULATORY_CARE_PROVIDER_SITE_OTHER)
Admission: RE | Admit: 2022-04-02 | Discharge: 2022-04-02 | Disposition: A | Discharge status: Home / Self Care | Payer: BC Managed Care – PPO | Source: Ambulatory Visit | Attending: Internal Medicine | Admitting: Internal Medicine

## 2022-04-02 ENCOUNTER — Ambulatory Visit
Admission: RE | Admit: 2022-04-02 | Discharge: 2022-04-02 | Disposition: A | Discharge status: Home / Self Care | Payer: BC Managed Care – PPO | Source: Ambulatory Visit | Attending: Internal Medicine | Admitting: Internal Medicine

## 2022-04-02 DIAGNOSIS — E042 Nontoxic multinodular goiter: Secondary | ICD-10-CM

## 2022-04-02 DIAGNOSIS — E210 Primary hyperparathyroidism: Secondary | ICD-10-CM | POA: Diagnosis not present

## 2022-04-05 ENCOUNTER — Other Ambulatory Visit: Payer: BC Managed Care – PPO

## 2022-04-05 DIAGNOSIS — E210 Primary hyperparathyroidism: Secondary | ICD-10-CM

## 2022-04-05 NOTE — Progress Notes (Unsigned)
Total volume 2850. Started 04-04-22 at 6:49 am ended 04-05-22 at 6:49 am. ?

## 2022-04-06 LAB — CALCIUM, URINE, 24 HOUR: Calcium, 24H Urine: 225 mg/24 h

## 2022-04-06 LAB — CREATININE, URINE, 24 HOUR: Creatinine, 24H Ur: 1.57 g/(24.h) (ref 0.50–2.15)

## 2022-04-07 ENCOUNTER — Encounter: Payer: Self-pay | Admitting: Internal Medicine

## 2022-05-03 ENCOUNTER — Encounter: Payer: Self-pay | Admitting: Family Medicine

## 2022-05-06 ENCOUNTER — Telehealth: Payer: BC Managed Care – PPO | Admitting: Family Medicine

## 2022-05-07 ENCOUNTER — Ambulatory Visit (INDEPENDENT_AMBULATORY_CARE_PROVIDER_SITE_OTHER): Payer: BC Managed Care – PPO | Admitting: Family Medicine

## 2022-05-07 VITALS — BP 100/68 | HR 76 | Temp 97.0°F | Ht 64.0 in | Wt 188.4 lb

## 2022-05-07 DIAGNOSIS — E669 Obesity, unspecified: Secondary | ICD-10-CM

## 2022-05-07 NOTE — Assessment & Plan Note (Signed)
Discussed options for weight loss including Wegovy and phentermine.  After discussing risk with phentermine she was not interested in this today, handout provided in case she reconsiders.  She was not interested in injectables, and discussed that these are likely very expensive and hard to get covered.  Discussed that a nutrition referral may be helpful to make sure she is getting the right types of calories, as she does Noom for weight loss.  Encouraged regular exercise as well.  She did recently have a normal thyroid, no need to repeat this.  Also discussed healthy weight and wellness referral, she declined at this time.  Continue to work on healthy diet and exercise nutrition referral and update if she changes her mind on any of the options

## 2022-05-07 NOTE — Patient Instructions (Addendum)
#Referral I have placed a referral to a specialist for you. You should receive a phone call from the specialty office. Make sure your voicemail is not full and that if you are able to answer your phone to unknown or new numbers.   It may take up to 2 weeks to hear about the referral. If you do not hear anything in 2 weeks, please call our office and ask to speak with the referral coordinator.  - nutritionist   Phentermine Capsules or Tablets What is this medication? PHENTERMINE (FEN ter meen) promotes weight loss. It works by decreasing appetite. It is often used for a short period of time. Changes to diet and exercise are often combined with this medication. This medicine may be used for other purposes; ask your health care provider or pharmacist if you have questions. COMMON BRAND NAME(S): Adipex-P, Atti-Plex P, Atti-Plex P Spansule, Fastin, Lomaira, Pro-Fast, Pro-Fast HS, Pro-Fast SA, Tara-8 What should I tell my care team before I take this medication? They need to know if you have any of these conditions: Agitation or nervousness Diabetes Glaucoma Heart disease High blood pressure History of drug abuse or addiction History of stroke Kidney disease Lung disease called Primary Pulmonary Hypertension (PPH) Taken an MAOI like Carbex, Eldepryl, Marplan, Nardil, or Parnate in last 14 days Taking stimulant medications for attention disorders, weight loss, or to stay awake Thyroid disease An unusual or allergic reaction to phentermine, other medications, foods, dyes, or preservatives Pregnant or trying to get pregnant Breast-feeding How should I use this medication? Take this medication by mouth with a glass of water. Follow the directions on the prescription label. Take your medication at regular intervals. Do not take it more often than directed. Do not stop taking except on your care team's advice. Talk to your care team about the use of this medication in children. While this  medication may be prescribed for children 17 years or older for selected conditions, precautions do apply. Overdosage: If you think you have taken too much of this medicine contact a poison control center or emergency room at once. NOTE: This medicine is only for you. Do not share this medicine with others. What if I miss a dose? If you miss a dose, take it as soon as you can. If it is almost time for your next dose, take only that dose. Do not take double or extra doses. What may interact with this medication? Do not take this medication with any of the following: MAOIs like Carbex, Eldepryl, Marplan, Nardil, and Parnate This medication may also interact with the following: Alcohol Certain medications for depression, anxiety, or psychotic disorders Certain medications for high blood pressure Linezolid Medications for colds or breathing difficulties like pseudoephedrine or phenylephrine Medications for diabetes Sibutramine Stimulant medications for attention disorders, weight loss, or to stay awake This list may not describe all possible interactions. Give your health care provider a list of all the medicines, herbs, non-prescription drugs, or dietary supplements you use. Also tell them if you smoke, drink alcohol, or use illegal drugs. Some items may interact with your medicine. What should I watch for while using this medication? Visit your care team for regular checks on your progress. Do not stop taking except on your care team's advice. You may develop a severe reaction. Your care team will tell you how much medication to take. Do not take this medication close to bedtime. It may prevent you from sleeping. You may get drowsy or dizzy. Do not drive,  use machinery, or do anything that needs mental alertness until you know how this medication affects you. Do not stand or sit up quickly, especially if you are an older patient. This reduces the risk of dizzy or fainting spells. Alcohol may  increase dizziness and drowsiness. Avoid alcoholic drinks. This medication may affect blood sugar levels. Ask your care team if changes in diet or medications are needed if you have diabetes. Women should inform their care team if they wish to become pregnant or think they might be pregnant. Losing weight while pregnant is not advised and may cause harm to the unborn child. Talk to your care team for more information. What side effects may I notice from receiving this medication? Side effects that you should report to your care team as soon as possible: Allergic reactions--skin rash, itching, hives, swelling of the face, lips, tongue, or throat Heart failure--shortness of breath, swelling of the ankles, feet, or hands, sudden weight gain, unusual weakness or fatigue Pulmonary hypertension--shortness of breath, chest pain, fast or irregular heartbeat, feeling faint or lightheaded, fatigue, swelling of the ankles or feet Side effects that usually do not require medical attention (report to your care team if they continue or are bothersome): Change in taste Diarrhea Dizziness Dry mouth Restlessness Trouble sleeping This list may not describe all possible side effects. Call your doctor for medical advice about side effects. You may report side effects to FDA at 1-800-FDA-1088. Where should I keep my medication? Keep out of the reach of children. This medication can be abused. Keep your medication in a safe place to protect it from theft. Do not share this medication with anyone. Selling or giving away this medication is dangerous and against the law. This medication may cause harm and death if it is taken by other adults, children, or pets. Return medication that has not been used to an official disposal site. Contact the DEA at 310-801-9285 or your city/county government to find a site. If you cannot return the medication, mix any unused medication with a substance like cat litter or coffee grounds.  Then throw the medication away in a sealed container like a sealed bag or coffee can with a lid. Do not use the medication after the expiration date. Store at room temperature between 20 and 25 degrees C (68 and 77 degrees F). Keep container tightly closed. NOTE: This sheet is a summary. It may not cover all possible information. If you have questions about this medicine, talk to your doctor, pharmacist, or health care provider.  2023 Elsevier/Gold Standard (2021-10-30 00:00:00)

## 2022-05-07 NOTE — Progress Notes (Signed)
   Subjective:     Ana Freeman is a 51 y.o. female presenting for Obesity (WC: 41")     HPI  #Obesity - weight watchers x 3 years - working out - walk 1 mile/day, weight lifting 2-3 times a day  - doing Noom x 1 week  - feels she keeps gaining weight - has gained 10 lbs - even with healthy diet and exercise  - weight 09/2021 -- 178  - weight today 04/2022 188   Review of Systems   Social History   Tobacco Use  Smoking Status Never  Smokeless Tobacco Never        Objective:    BP Readings from Last 3 Encounters:  05/07/22 100/68  03/30/22 108/66  12/11/21 102/60   Wt Readings from Last 3 Encounters:  05/07/22 188 lb 6 oz (85.4 kg)  03/30/22 189 lb (85.7 kg)  02/22/22 186 lb (84.4 kg)    BP 100/68   Pulse 76   Temp (!) 97 F (36.1 C) (Temporal)   Ht '5\' 4"'$  (1.626 m)   Wt 188 lb 6 oz (85.4 kg)   LMP 11/05/2020   SpO2 98%   BMI 32.33 kg/m    Physical Exam Constitutional:      General: She is not in acute distress.    Appearance: She is well-developed. She is not diaphoretic.  HENT:     Right Ear: External ear normal.     Left Ear: External ear normal.     Nose: Nose normal.  Eyes:     Conjunctiva/sclera: Conjunctivae normal.  Cardiovascular:     Rate and Rhythm: Normal rate and regular rhythm.     Heart sounds: No murmur heard. Pulmonary:     Effort: Pulmonary effort is normal. No respiratory distress.     Breath sounds: Normal breath sounds. No wheezing.  Musculoskeletal:     Cervical back: Neck supple.  Skin:    General: Skin is warm and dry.     Capillary Refill: Capillary refill takes less than 2 seconds.  Neurological:     Mental Status: She is alert. Mental status is at baseline.  Psychiatric:        Mood and Affect: Mood normal.        Behavior: Behavior normal.          Assessment & Plan:   Problem List Items Addressed This Visit       Other   Obesity (BMI 30.0-34.9) - Primary    Discussed options for weight loss  including Wegovy and phentermine.  After discussing risk with phentermine she was not interested in this today, handout provided in case she reconsiders.  She was not interested in injectables, and discussed that these are likely very expensive and hard to get covered.  Discussed that a nutrition referral may be helpful to make sure she is getting the right types of calories, as she does Noom for weight loss.  Encouraged regular exercise as well.  She did recently have a normal thyroid, no need to repeat this.  Also discussed healthy weight and wellness referral, she declined at this time.  Continue to work on healthy diet and exercise nutrition referral and update if she changes her mind on any of the options       Relevant Orders   Ambulatory referral to Nutrition and Diabetic Education     Return if symptoms worsen or fail to improve.  Lesleigh Noe, MD

## 2022-06-02 ENCOUNTER — Encounter: Payer: Self-pay | Admitting: Family Medicine

## 2022-06-23 ENCOUNTER — Encounter: Payer: Self-pay | Admitting: Gastroenterology

## 2022-07-05 ENCOUNTER — Encounter: Payer: Self-pay | Admitting: Dietician

## 2022-07-05 ENCOUNTER — Encounter: Payer: BC Managed Care – PPO | Attending: Family Medicine | Admitting: Dietician

## 2022-07-05 VITALS — Ht 64.0 in | Wt 192.7 lb

## 2022-07-05 DIAGNOSIS — E669 Obesity, unspecified: Secondary | ICD-10-CM | POA: Diagnosis not present

## 2022-07-05 NOTE — Patient Instructions (Signed)
Decrease portions of some foods such as starches and meats. Use food scale/ measuring cups to increase awareness of portion sizes. Choose healthy snacks such as fruits most of the time; control portions of chips of other salty/ starchy snacks Increase vegetables, plan to include at least one serving with most meals.  Keep up regular exercise, great job!

## 2022-07-05 NOTE — Progress Notes (Signed)
Medical Nutrition Therapy: Visit start time: 1600  end time: 1730  Assessment:   Referral Diagnosis: obesity  Other medical history/ diagnoses: GERD, hyperkalemia, parathyroid adenoma  Psychosocial issues/ stress concerns: none  Medications, supplements: reconciled list in medical record   Preferred learning method:  Auditory Visual Hands-on   Current weight: 192.7lbs Height: 5'4" BMI: 33.08 Patient's personal weight goal: 160lbs  Progress and evaluation:  Patient reports experiencing gradual weight loss over past 20 years, with more accelerated gain since hysterectomy and hormone replacement therapy; more recently has has elevated calcium and potassium and subsequent diagnosis of parathyroid adenoma facing potential surgical removal.  She has engaged in regular exercise at least 3 times a week for several years without any weight loss. Tried noom program which didn't work well for her lifestyle; weight watchers has worked but stopped when hitting weight loss plateau/ drifting away from some healthy habits.She was able to lose down to about 175lbs but not to her goal of 160. She has been trying to fast from 8pm until 12pm the next day; has not noticed any weight loss, but has continued the practice most days.  Has body comp scale which indicated low protein, low body water Food allergies: no  Special diet practices: no Patient seeks help with weight loss to reduce health risk    Dietary Intake:  Usual eating pattern includes 2-3 meals and 1 snacks per day. Dining out frequency: 2 meals per week. Who plans meals/ buys groceries? Self, spouse Who prepares meals? Self, spouse  Breakfast: skips for intermittent fasting; sometimes yogurt or protein drink or fruit; occ waffle or pancakes, biscuit on Saturdays Snack: no Lunch: frequent lunch events at work ie pizza/ sandwich/ chinese/ other; sandwich and chips from home Snack: nuts; chips/ crackers with cheese/ corn poppers/ cheese-it  crackers Supper: usually cooks at home -- air fried salmon/ chicken/ + veg steamed or sauteed spinach/  spaghetti with cheese/ chili/ pizza/ [place] when out Snack: none Beverages: water, occasional diet soda  Physical activity: cardio and strength training 30-60 minutes, 4 times a week   Intervention:   Nutrition Care Education:   Basic nutrition: basic food groups; appropriate nutrient balance; appropriate meal and snack schedule; general nutrition guidelines    Weight control: determining reasonable weight loss rate; importance of low sugar and low fat choices; portion control strategies including incorporating low carb veg with meals, avoiding eating from large containers, measuring portions; estimated energy needs for weight loss at 1400-1500 kcal, provided guidance for 45% CHO, 25% pro, 30% fat; role of physical activity; options for meal planning and tracking intake; effects of stress, thyroid and parathyroid function, hormonal levels and changes on weight control Advanced nutrition:  cooking techniques; food label reading   Other intervention notes: Patient feels she likely needs to decrease food portions; she voices readiness for change.  Established nutrition goals with direction from patient.    Nutritional Diagnosis:  [place]-2.1 Inpaired nutrition utilization As related to parathyroid disease.  As evidenced by hyperkalemia and hypercalcemia. [place]-3.3 Overweight/obesity As related to excess calories, hormonal changes.  As evidenced by patient with current BMI of 33.   Education Materials given:  Museum/gallery conservator with food lists, sample meal pattern Sample menus Visit summary with goals/ instructions   Learner/ who was taught:  Patient   Level of understanding: Verbalizes/ demonstrates competency  Demonstrated degree of understanding via:   Teach back Learning barriers: None  Willingness to learn/ readiness for change: Eager, change in progress  Monitoring and  Evaluation:  Dietary intake, exercise, and body weight      follow up:  08/20/22 at 8:15am

## 2022-07-06 ENCOUNTER — Encounter: Payer: Self-pay | Admitting: Family Medicine

## 2022-08-02 ENCOUNTER — Ambulatory Visit (INDEPENDENT_AMBULATORY_CARE_PROVIDER_SITE_OTHER): Payer: BC Managed Care – PPO | Admitting: Internal Medicine

## 2022-08-02 ENCOUNTER — Other Ambulatory Visit (INDEPENDENT_AMBULATORY_CARE_PROVIDER_SITE_OTHER): Payer: BC Managed Care – PPO

## 2022-08-02 ENCOUNTER — Encounter: Payer: Self-pay | Admitting: Internal Medicine

## 2022-08-02 VITALS — BP 130/80 | HR 68 | Ht 64.0 in | Wt 188.8 lb

## 2022-08-02 DIAGNOSIS — E042 Nontoxic multinodular goiter: Secondary | ICD-10-CM

## 2022-08-02 DIAGNOSIS — E210 Primary hyperparathyroidism: Secondary | ICD-10-CM

## 2022-08-02 DIAGNOSIS — J358 Other chronic diseases of tonsils and adenoids: Secondary | ICD-10-CM

## 2022-08-02 LAB — BASIC METABOLIC PANEL
BUN: 11 mg/dL (ref 6–23)
CO2: 25 mEq/L (ref 19–32)
Calcium: 11 mg/dL — ABNORMAL HIGH (ref 8.4–10.5)
Chloride: 106 mEq/L (ref 96–112)
Creatinine, Ser: 0.82 mg/dL (ref 0.40–1.20)
GFR: 83.83 mL/min (ref >60.00)
Glucose, Bld: 90 mg/dL (ref 70–99)
Potassium: 4.4 mEq/L (ref 3.5–5.1)
Sodium: 138 mEq/L (ref 135–145)

## 2022-08-02 LAB — VITAMIN D 25 HYDROXY (VIT D DEFICIENCY, FRACTURES): VITD: 26.71 ng/mL — ABNORMAL LOW (ref 30.00–100.00)

## 2022-08-02 LAB — T3, FREE: T3, Free: 7.3 pg/mL — ABNORMAL HIGH (ref 2.3–4.2)

## 2022-08-02 LAB — TSH: TSH: 0.81 u[IU]/mL (ref 0.35–5.50)

## 2022-08-02 LAB — T4, FREE: Free T4: 2.32 ng/dL — ABNORMAL HIGH (ref 0.60–1.60)

## 2022-08-02 LAB — ALBUMIN: Albumin: 4.3 g/dL (ref 3.5–5.2)

## 2022-08-02 NOTE — Patient Instructions (Signed)
-   Stay Hydrated - Avoid over the counter calcium  - Consume 2-3 servings of dietary calcium daily
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
## 2022-08-03 ENCOUNTER — Telehealth: Payer: Self-pay | Admitting: Internal Medicine

## 2022-08-03 DIAGNOSIS — E042 Nontoxic multinodular goiter: Secondary | ICD-10-CM

## 2022-08-03 LAB — CALCIUM, IONIZED: Calcium, Ion: 5.8 mg/dL — ABNORMAL HIGH (ref 4.7–5.5)

## 2022-08-03 LAB — PARATHYROID HORMONE, INTACT (NO CA): PTH: 149 pg/mL — ABNORMAL HIGH (ref 16–77)

## 2022-08-03 NOTE — Telephone Encounter (Signed)
Patient advised and scheduled for lab recheck

## 2022-08-03 NOTE — Telephone Encounter (Signed)
Please let the patient know that part of her thyroid hormone came back abnormal showing elevation.  I suspect this has to do with biotin intake   I would suggest she stop the biotin (needs to check any over-the-counter supplements that contain biotin such as collagen)  Please schedule for repeat thyroid function test in 4 weeks   Thanks

## 2022-08-06 ENCOUNTER — Other Ambulatory Visit: Payer: Self-pay | Admitting: Gastroenterology

## 2022-08-10 ENCOUNTER — Encounter: Payer: Self-pay | Admitting: Internal Medicine

## 2022-08-10 ENCOUNTER — Encounter: Payer: Self-pay | Admitting: Family Medicine

## 2022-08-17 NOTE — Progress Notes (Deleted)
    Ana Stofko T. Ana Keelan, MD, Ana Freeman at [facility] [place], [address]  Phone: [phone]  FAX: [place] - 51 y.o. female  MRN [id number]  Date of Birth: October 10, 1971  Date: 08/18/2022  PCP: Lesleigh Noe, MD  Referral: Lesleigh Noe, MD  No chief complaint on file.  Subjective:   Ana Freeman is a 51 y.o. very pleasant female patient with There is no height or weight on file to calculate BMI. who presents with the following:  Patient presents with some ongoing issues with her R leg/stride as well as groin pain.     Review of Systems is noted in the HPI, as appropriate  Objective:   LMP 11/05/2020   GEN: No acute distress; alert,appropriate. PULM: Breathing comfortably in no respiratory distress PSYCH: Normally interactive.   Laboratory and Imaging Data:  Assessment and Plan:   ***

## 2022-08-18 ENCOUNTER — Ambulatory Visit: Payer: BC Managed Care – PPO | Admitting: Family Medicine

## 2022-08-18 ENCOUNTER — Encounter: Payer: Self-pay | Admitting: Family Medicine

## 2022-08-18 VITALS — BP 120/80 | HR 66 | Temp 98.0°F | Ht 64.0 in | Wt 191.0 lb

## 2022-08-18 DIAGNOSIS — M76891 Other specified enthesopathies of right lower limb, excluding foot: Secondary | ICD-10-CM

## 2022-08-18 MED ORDER — DICLOFENAC SODIUM 75 MG PO TBEC: 75.0000 mg | DELAYED_RELEASE_TABLET | Freq: Two times a day (BID) | ORAL | 1 refills | Status: DC
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
## 2022-08-20 ENCOUNTER — Encounter: Payer: BC Managed Care – PPO | Attending: Family Medicine | Admitting: Dietician

## 2022-08-20 ENCOUNTER — Encounter: Payer: Self-pay | Admitting: Dietician

## 2022-08-20 ENCOUNTER — Other Ambulatory Visit (INDEPENDENT_AMBULATORY_CARE_PROVIDER_SITE_OTHER): Payer: BC Managed Care – PPO

## 2022-08-20 VITALS — Ht 64.0 in | Wt 186.6 lb

## 2022-08-20 DIAGNOSIS — E669 Obesity, unspecified: Secondary | ICD-10-CM | POA: Insufficient documentation

## 2022-08-20 DIAGNOSIS — D351 Benign neoplasm of parathyroid gland: Secondary | ICD-10-CM | POA: Diagnosis not present

## 2022-08-20 DIAGNOSIS — E042 Nontoxic multinodular goiter: Secondary | ICD-10-CM

## 2022-08-20 LAB — T4, FREE: Free T4: 1.06 ng/dL (ref 0.60–1.60)

## 2022-08-20 LAB — T3, FREE: T3, Free: 3.2 pg/mL (ref 2.3–4.2)

## 2022-08-20 LAB — TSH: TSH: 1.28 u[IU]/mL (ref 0.35–5.50)

## 2022-08-20 NOTE — Patient Instructions (Addendum)
After the cleanse time, monitor food intake to ensure adequate calories -- minimum of 1200 daily with goal closer to 1400-1500 especially when exercising 3 or more times a week Focus on slow, gradual weight loss rather than quick weight loss.  Continue with healthy food choices most of the time; allow for splurge/ celebration meals periodically without feeling guilty afterwards.  Call with any questions or concerns, or to schedule additional follow up.

## 2022-08-20 NOTE — Progress Notes (Signed)
Medical Nutrition Therapy: Visit start time: 0825  end time: 0900  Assessment:  Diagnosis: obesity Medical history changes: no changes Psychosocial issues/ stress concerns: none  Medications, supplement changes: no changes per patient   Current weight: 186.6lbs Height: 5'4" BMI: 32.03  Progress and evaluation:  Patient states she was following meal plan developed at last visit but not noticing much weight loss. She has been using scale to measure food portions, limiting sugar and fat, with disappointing results. Now is feeling desperate to lose more weight.  She has lost at least 4lbs prior to beginning colon cleansing last week. She has since lost several more lbs. taking colon cleansing supplement Mag 7 She plans to complete the cleansing pattern within one week.  Exercise has decreased due to frustrated/ disappointed emotions. She reports feeling overly fixated on numbers both on weight scale and body composition estimates from tracking app which reports her body fat% as too high (36%)  She wants to develop a better emotional state with her progress and habits.     Dietary Intake:  Smoothie beverage with protein powder throughout day for colon cleansing puroposes   Intervention:   Nutrition Care Education:  Basic nutrition: reviewed appropriate nutrient balance; appropriate meal and snack schedule; general nutrition guidelines    Weight control: reviewed progress since previous visit; avoiding relying on any specific number such as BMI to gauge overall health; discussed body composition and muscle mass as factor affecting weight and health; discussed positive mental relationship with food including minimizing feelings of guilt, controlling food cravings/ appetite, allowing for flexibility with eating pattern; discussed appropriate weight loss rate of 2-5lbs per month and expecting some fluctuation from day to day; reviewed basal metabolic rate and daily kcal needs/goal.  Other  intervention notes: Commended patient for progress made with weight loss and positive diet changes. Measured body composition with weight at 185.9, BMI 31.9, skeletal muscle mass 62.8lbs, % body fat 37.0, basal metabolic rate estimate of 1458 kcal. Updated goals with direction from patient. No additional follow up scheduled at this time; patient will schedule later if needed.   Nutritional Diagnosis:  [place]-2.1 Inpaired nutrition utilization As related to parathyroid disease.  As evidenced by hyperkalemia and hypercalcemia. [place]-3.3 Overweight/obesity As related to excess calories, hormonal changes.  As evidenced by patient with current BMI of 33.   Education Materials given:  Tips for Managing Food Cravings 1200, 1500 kcal bariatric menus   Learner/ who was taught:  Patient    Level of understanding: Verbalizes/ demonstrates competency   Demonstrated degree of understanding via:   Teach back Learning barriers: None  Willingness to learn/ readiness for change: Eager, change in progress   Monitoring and Evaluation:  Dietary intake, exercise, and body weight      follow up: prn

## 2022-10-08 ENCOUNTER — Ambulatory Visit: Payer: BC Managed Care – PPO

## 2022-10-13 ENCOUNTER — Other Ambulatory Visit: Payer: Self-pay | Admitting: Internal Medicine

## 2022-10-13 ENCOUNTER — Encounter: Payer: Self-pay | Admitting: Internal Medicine

## 2022-10-13 DIAGNOSIS — E210 Primary hyperparathyroidism: Secondary | ICD-10-CM

## 2022-11-01 ENCOUNTER — Encounter: Payer: Self-pay | Admitting: Family Medicine

## 2022-11-01 ENCOUNTER — Ambulatory Visit (INDEPENDENT_AMBULATORY_CARE_PROVIDER_SITE_OTHER)
Admission: RE | Admit: 2022-11-01 | Discharge: 2022-11-01 | Disposition: A | Discharge status: Home / Self Care | Payer: BC Managed Care – PPO | Source: Ambulatory Visit | Attending: Family Medicine | Admitting: Family Medicine

## 2022-11-01 ENCOUNTER — Ambulatory Visit: Payer: BC Managed Care – PPO | Admitting: Family Medicine

## 2022-11-01 VITALS — BP 98/60 | HR 67 | Temp 98.2°F | Ht 64.0 in | Wt 193.5 lb

## 2022-11-01 DIAGNOSIS — M76891 Other specified enthesopathies of right lower limb, excluding foot: Secondary | ICD-10-CM

## 2022-11-01 DIAGNOSIS — M25551 Pain in right hip: Secondary | ICD-10-CM | POA: Diagnosis not present

## 2022-11-01 MED ORDER — PREDNISONE 20 MG PO TABS: ORAL_TABLET | ORAL | 0 refills | Status: DC

## 2022-11-01 NOTE — Progress Notes (Signed)
Virgal [place] T. Chrles Selley, MD, [place] at [facility] [place], [address]  Phone: [phone]  FAX: [phone]  Ana Freeman - 51 y.o. female  MRN [id number]  Date of Birth: 1971-08-25  Date: 11/01/2022  PCP: Waunita Schooner, MD  Referral: Waunita Schooner, MD  Chief Complaint  Patient presents with   Follow-up    Right Hip   Subjective:   Ana Freeman is a 51 y.o. very pleasant female patient with Body mass index is 33.21 kg/m. who presents with the following:  Very pleasant young lady who I saw several months ago for some right hip pain, at that point I felt that she was having some right-sided hip tendinopathy and she was remarkably weak.  Did have her do some home rehab and also send her to formal physical therapy.  She is here today in follow-up.  Has been taking her diclofenac.  Feels a lot stronger, and if she walks for a long period of time, and she will have pain.   She continues to have some problems with her right-sided hip.  She also has some pain a bit on the lateral hip as well as in the posterior pelvis region.  She does not describe any back pain or significant radiculopathy.  She is having anterior pain, but again she is also having lateral pain and posterior buttocks pain.  She did not go to physical therapy.  08/18/2022 Last OV with Owens Loffler, MD  Very pleasant young lady, age 13, and she presents with some acute right-sided groin pain has been present for roughly 6 weeks.  She has not had any kind of specific injury at that she can recall, and she does exacerbate movement by full hip flexion as well as rotational movement at the hip.   She does not have a history of osteoarthritis, she maintains full motion at the hip.  History is also significant for tibial fracture on the left in November 2022.  She also has a history of in 2020 a complex tib-fib fracture requiring external fixation, as  well as ultimately ORIF on the left of the tib-fib in addition with an ORIF of the right ankle with complex fracture.   She has weakness now in the hip on the right, and she is having a gait disturbance with an active limp with all movement.  Review of Systems is noted in the HPI, as appropriate  Objective:   BP 98/60   Pulse 67   Temp 98.2 F (36.8 C) (Oral)   Ht '5\' 4"'$  (1.626 m)   Wt 193 lb 8 oz (87.8 kg)   LMP 11/05/2020   SpO2 99%   BMI 33.21 kg/m   GEN: No acute distress; alert,appropriate. PULM: Breathing comfortably in no respiratory distress PSYCH: Normally interactive.    HIP EXAM: SIDE: Right ROM: Abduction, Flexion, Internal and External range of motion: Does have some minimal restriction at terminal internal range of motion and external range of motion, this does provoke some pain GTB: TTP SLR: NEG Knees: No effusion FABER: NT REVERSE FABER: NT, neg Piriformis: Tender to direct palpation, right greater than left Tender throughout the entirety of the posterior pelvis.  Tender throughout the entirety of the buttocks region. Str: flexion: 4+/5 abduction: 4+/5 adduction: 4+/5 Strength testing non-tender    Laboratory and Imaging Data:  Assessment and Plan:     ICD-10-CM   1. Acute right hip pain  M25.551 DG HIP  UNILAT WITH PELVIS 2-3 VIEWS RIGHT    2. Hip tendonitis, right  M76.891      I do not think that this is intra-articularly driven.  She does have some very minimal osteophyte formation at the inferior hip, but the joint spaces essentially preserved.  Multipart pain with pain in the trochanteric bursa to a mild degree, mild pain in the piriformis, mild pain throughout the entirety of posterior the pelvis.  I do not think that there is a single, focal pathological problem is driving all of this.  We did give her a course of some prednisone x10 days.  I still think it would be very helpful for her to do physical therapy, but for now she wants to hold  off.  If she is still not doing well in 10 to 14 days, she is open to doing some formal physical therapy.  Medication Management during today's office visit: Meds ordered this encounter  Medications   predniSONE (DELTASONE) 20 MG tablet    Sig: 2 tabs po daily for 5 days, then 1 tab po daily for 5 days    Dispense:  15 tablet    Refill:  0   Medications Discontinued During This Encounter  Medication Reason   OVER THE COUNTER MEDICATION Completed Course   Specialty Vitamins Products (BIOTIN PLUS KERATIN PO) Completed Course    Orders placed today for conditions managed today: Orders Placed This Encounter  Procedures   DG HIP UNILAT WITH PELVIS 2-3 VIEWS RIGHT    Disposition: No follow-ups on file.  Dragon [employer] One speech-to-text software was used for transcription in this dictation.  Possible transcriptional errors can occur using Editor, commissioning.   Signed,  Maud Deed. Wirt Hemmerich, MD   Outpatient Encounter Medications as of 11/01/2022  Medication Sig   estradiol (ESTRACE) 0.5 MG tablet Take 1 mg by mouth daily.   pantoprazole (PROTONIX) 20 MG tablet Take 1 tablet (20 mg total) by mouth 2 (two) times daily.   predniSONE (DELTASONE) 20 MG tablet 2 tabs po daily for 5 days, then 1 tab po daily for 5 days   valACYclovir (VALTREX) 500 MG tablet Take 500 mg by mouth as needed (BREAKOUTS).   diclofenac (VOLTAREN) 75 MG EC tablet Take 1 tablet (75 mg total) by mouth 2 (two) times daily. (Patient not taking: Reported on 11/01/2022)   [DISCONTINUED] OVER THE COUNTER MEDICATION Take 1 tablet by mouth in the morning, at noon, and at bedtime. [place] Gummies   [DISCONTINUED] Specialty Vitamins Products (BIOTIN PLUS KERATIN PO) Take 2 each by mouth daily. 2564mg biotin, hair skin and nails formula   No facility-administered encounter medications on file as of 11/01/2022.

## 2022-11-04 ENCOUNTER — Other Ambulatory Visit: Payer: Self-pay | Admitting: Gastroenterology

## 2022-11-21 ENCOUNTER — Other Ambulatory Visit: Payer: Self-pay | Admitting: Gastroenterology

## 2022-11-23 ENCOUNTER — Ambulatory Visit: Payer: Self-pay | Admitting: Surgery

## 2022-11-26 ENCOUNTER — Other Ambulatory Visit: Payer: Self-pay | Admitting: Family Medicine

## 2022-11-26 MED ORDER — PREDNISONE 20 MG PO TABS: ORAL_TABLET | ORAL | 0 refills | Status: DC

## 2022-11-26 NOTE — Addendum Note (Signed)
Addended by: Carter Kitten on: 11/26/2022 11:26 AM   Modules accepted: Orders

## 2022-11-26 NOTE — Telephone Encounter (Signed)
She would like a mychart message also letting her know that it has been sent in and where to.

## 2022-11-26 NOTE — Telephone Encounter (Signed)
  Encourage patient to contact the pharmacy for refills or they can request refills through [facility]  Did the patient contact the pharmacy: No  LAST APPOINTMENT DATE: 11/01/2022  NEXT APPOINTMENT DATE: N/A  MEDICATION: predniSONE (DELTASONE) 20 MG tablet   Is the patient out of medication? Yes  PH[facility]ACY: [facility] [place] - [place] - [address] AT [facility]   Let patient know to contact pharmacy at the end of the day to make sure medication is ready.  Please notify patient to allow 48-72 hours to process

## 2022-11-26 NOTE — Telephone Encounter (Signed)
Patient would like a call once refill is sent in.

## 2022-11-26 NOTE — Telephone Encounter (Addendum)
Spoke with patient to let her know prescription was sent to [facility] in [place].  Patient is actually in [place] and is wanting Rx sent there.   Rx resent to [facility] in [place] as requested.  [facility] at [facility].

## 2022-11-26 NOTE — Addendum Note (Signed)
Addended by: Carter Kitten on: 11/26/2022 11:36 AM   Modules accepted: Orders

## 2022-11-26 NOTE — Telephone Encounter (Signed)
I sent in to [facility].  The notes indicate that she would like a phone call and mychart message that it was sent.

## 2022-11-26 NOTE — Telephone Encounter (Signed)
Last office visit 11/01/22 with Dr. Lorelei Pont for Acute hip pain/hip tendonitis right.  Last refilled 11/01/22 for #15 with no refills.  No future appointments.  Refill?

## 2022-12-09 ENCOUNTER — Other Ambulatory Visit: Payer: Self-pay | Admitting: Family Medicine

## 2022-12-09 NOTE — Telephone Encounter (Signed)
Last office visit 11/01/22 with Dr. Lorelei Pont for right hip pain.  Last refilled 08/18/22 for #60 with 1 refill.  No future appointments at [facility].  Refill?

## 2022-12-17 ENCOUNTER — Telehealth: Payer: BC Managed Care – PPO | Admitting: Nurse Practitioner

## 2022-12-17 DIAGNOSIS — R002 Palpitations: Secondary | ICD-10-CM | POA: Diagnosis not present

## 2022-12-17 NOTE — Progress Notes (Signed)
Virtual Visit Consent   Ana Freeman, you are scheduled for a virtual visit with a [facility] provider today. Just as with appointments in the office, your consent must be obtained to participate. Your consent will be active for this visit and any virtual visit you may have with one of our providers in the next 365 days. If you have a MyChart account, a copy of this consent can be sent to you electronically.  As this is a virtual visit, video technology does not allow for your provider to perform a traditional examination. This may limit your provider's ability to fully assess your condition. If your provider identifies any concerns that need to be evaluated in person or the need to arrange testing (such as labs, EKG, etc.), we will make arrangements to do so. Although advances in technology are sophisticated, we cannot ensure that it will always work on either your end or our end. If the connection with a video visit is poor, the visit may have to be switched to a telephone visit. With either a video or telephone visit, we are not always able to ensure that we have a secure connection.  By engaging in this virtual visit, you consent to the provision of healthcare and authorize for your insurance to be billed (if applicable) for the services provided during this visit. Depending on your insurance coverage, you may receive a charge related to this service.  I need to obtain your verbal consent now. Are you willing to proceed with your visit today? Ana Freeman has provided verbal consent on 12/17/2022 for a virtual visit (video or telephone). Ana Schneiders, FNP  Date: 12/17/2022 7:36 PM  Virtual Visit via Video Note   I, Ana Freeman, connected with  Ana Freeman  ([id number], Aug 02, 1971) on 12/17/22 at  7:45 PM EST by a video-enabled telemedicine application and verified that I am speaking with the correct person using two identifiers.  Location: Patient: Virtual Visit Location Patient:  Home Provider: Virtual Visit Location Provider: Home Office   I discussed the limitations of evaluation and management by telemedicine and the availability of in person appointments. The patient expressed understanding and agreed to proceed.    History of Present Illness: Ana Freeman is a 52 y.o. who identifies as a female who was assigned female at birth, and is being seen today for heart palpitations.  She was recently started on Wellbutrin 11/15/22 '150mg'$  once daily for anxiety & weight management. This was started by Bariatric clinic at [place]. Overall she feels that the medication has helped her mental health 7 she has felt "happier"   Denies current or past suicidal ideation   She has been experiencing heart palpitations for the past 2-3 weeks consistently, started one week after being on Wellbutrin.   She has had heart palpitations in the past & was referred to Cardiology for workup She was ordered for a coronary CTA, patient remembers having this performed but results are not found in system.  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   Hyperkalemia 02/22/2022   Acute pain of right knee 10/12/2021   Right hand pain 10/12/2021   Elevated glucose 10/12/2021   COVID-19 virus infection 05/08/2021   Herpes 10/28/2020   Alopecia 10/28/2020  Left arm numbness 10/28/2020   Left ankle swelling 10/28/2020   Acute stress disorder    Hyperlipidemia 07/19/2018   Chest pain of uncertain etiology 42/68/3419   GERD (gastroesophageal reflux disease) 10/28/2015   Nonspecific abnormal electrocardiogram (ECG) (EKG) 02/11/2015   Anemia, iron deficiency 12/20/2014    Allergies:  Allergies  Allergen Reactions   Chlorhexidine Gluconate Itching    Medications:  Current Outpatient Medications:    diclofenac (VOLTAREN) 75 MG EC tablet, TAKE 1 TABLET BY MOUTH TWICE A DAY, Disp: 60 tablet, Rfl: 1   estradiol (ESTRACE) 0.5 MG tablet, Take 1 mg by mouth daily., Disp: , Rfl:    pantoprazole (PROTONIX) 20 MG tablet, Take 1 tablet (20 mg total) by mouth 2 (two) times daily. Need office visit for further refills, Disp: 60 tablet, Rfl: 0   predniSONE (DELTASONE) 20 MG tablet, 2 tabs po daily for 5 days, then 1 tab po daily for 5 days, Disp: 15 tablet, Rfl: 0   valACYclovir (VALTREX) 500 MG tablet, Take 500 mg by mouth as needed (BREAKOUTS)., Disp: , Rfl:   Observations/Objective: Patient is well-developed, well-nourished in no acute distress.  Resting comfortably  at home.  Head is normocephalic, atraumatic.  No labored breathing.  Speech is clear and coherent with logical content.  Patient is alert and oriented at baseline.    Assessment and Plan: 1. Palpitations Discussed stopping Wellbutrin. Potential SE when stopping  Gave number for [facility] in [place] and help line number   Advised ED visit if palpitations persist, worsen, cause dizziness, chest pain or SOB  She has messaged Cardiology who will be copied on note for follow up  Also advised follow up with PCP office and [facility] in regards to SE from medication         Follow Up Instructions: I discussed the assessment and treatment plan with the patient. The patient was provided an opportunity to ask questions and all were answered. The patient agreed with the plan and demonstrated an understanding of the instructions.  A copy of instructions were sent to the patient via MyChart unless otherwise noted below.    The patient was advised to call back or seek an in-person evaluation if the symptoms worsen or if the condition fails to improve as anticipated.  Time:  I spent 15 minutes with the patient via telehealth technology discussing the above problems/concerns.     Ana Schneiders, FNP

## 2022-12-17 NOTE — Patient Instructions (Signed)
[address] [place], [place] [address] [place]: [phone] or [phone]

## 2022-12-28 ENCOUNTER — Ambulatory Visit (HOSPITAL_BASED_OUTPATIENT_CLINIC_OR_DEPARTMENT_OTHER): Payer: BC Managed Care – PPO | Admitting: Family

## 2022-12-29 ENCOUNTER — Emergency Department
Admission: EM | Admit: 2022-12-29 | Discharge: 2022-12-29 | Disposition: A | Discharge status: Home / Self Care | Payer: BC Managed Care – PPO | Attending: Emergency Medicine | Admitting: Emergency Medicine

## 2022-12-29 DIAGNOSIS — R519 Headache, unspecified: Secondary | ICD-10-CM | POA: Diagnosis not present

## 2022-12-29 DIAGNOSIS — R079 Chest pain, unspecified: Secondary | ICD-10-CM | POA: Diagnosis not present

## 2022-12-29 DIAGNOSIS — R0602 Shortness of breath: Secondary | ICD-10-CM | POA: Diagnosis not present

## 2022-12-29 DIAGNOSIS — R42 Dizziness and giddiness: Secondary | ICD-10-CM | POA: Insufficient documentation

## 2022-12-29 LAB — BASIC METABOLIC PANEL
Anion gap: 5 (ref 5–15)
BUN: 12 mg/dL (ref 6–20)
CO2: 23 mmol/L (ref 22–32)
Calcium: 10.7 mg/dL — ABNORMAL HIGH (ref 8.9–10.3)
Chloride: 107 mmol/L (ref 98–111)
Creatinine, Ser: 0.83 mg/dL (ref 0.44–1.00)
GFR, Estimated: >60 mL/min (ref >60)
Glucose, Bld: 103 mg/dL — ABNORMAL HIGH (ref 70–99)
Potassium: 4.9 mmol/L (ref 3.5–5.1)
Sodium: 135 mmol/L (ref 135–145)

## 2022-12-29 LAB — CBC
HCT: 42.3 % (ref 36.0–46.0)
Hemoglobin: 13.7 g/dL (ref 12.0–15.0)
MCH: 28.4 pg (ref 26.0–34.0)
MCHC: 32.4 g/dL (ref 30.0–36.0)
MCV: 87.6 fL (ref 80.0–100.0)
Platelets: 252 10*3/uL (ref 150–400)
RBC: 4.83 MIL/uL (ref 3.87–5.11)
RDW: 12.7 % (ref 11.5–15.5)
WBC: 4.2 10*3/uL (ref 4.0–10.5)
nRBC: 0 % (ref 0.0–0.2)

## 2022-12-29 LAB — URINALYSIS, ROUTINE W REFLEX MICROSCOPIC
Bilirubin Urine: NEGATIVE
Glucose, UA: NEGATIVE mg/dL
Ketones, ur: NEGATIVE mg/dL
Leukocytes,Ua: NEGATIVE
Nitrite: NEGATIVE
Protein, ur: NEGATIVE mg/dL
Specific Gravity, Urine: 1.006 (ref 1.005–1.030)
WBC, UA: NONE SEEN WBC/hpf (ref 0–5)
pH: 7 (ref 5.0–8.0)

## 2022-12-29 LAB — TROPONIN I (HIGH SENSITIVITY)
Troponin I (High Sensitivity): <2 ng/L (ref <18)
Troponin I (High Sensitivity): 3 ng/L (ref <18)

## 2022-12-29 LAB — TSH: TSH: 1.028 u[IU]/mL (ref 0.350–4.500)

## 2022-12-29 LAB — POC URINE PREG, ED: Preg Test, Ur: NEGATIVE

## 2022-12-29 LAB — D-DIMER, QUANTITATIVE: D-Dimer, Quant: <0.27 ug/mL-FEU (ref 0.00–0.50)

## 2022-12-29 MED ORDER — SODIUM CHLORIDE 0.9 % IV BOLUS
1000.0000 mL | Freq: Once | INTRAVENOUS | Status: AC
  Administered 2022-12-29: 1000 mL via INTRAVENOUS

## 2022-12-29 NOTE — ED Triage Notes (Signed)
Pt to ED via POV from home. Pt reports dizziness, HA, SOB that have been ongoing for a few months. Pt is scheduled for surgery to remove parathyroid on 2/6. Pt states symptoms have become concerning.

## 2022-12-29 NOTE — Discharge Instructions (Addendum)
Your workup was reassuring there were no signs of heart attack or blood clots.  I will have you follow-up with cardiology outpatient so we continue to have any symptoms you can have screening for heart attacks done with them.  Return to ER if develop worsening symptoms or any other concerns.

## 2022-12-29 NOTE — ED Provider Notes (Addendum)
[facility] Provider Note    Event Date/Time   First MD Initiated Contact with Patient 12/29/22 1910     (approximate)   History   Dizziness   HPI  Ana Freeman is a 52 y.o. female with history of primary hyperparathyroidism leading to elevated serum calciums who is planned to have surgery on 01/11/2023 who comes in with concerns for multiple symptoms including dizziness, headache, shortness of breath.  Patient reports having a mild headache earlier today.  She reports that the headache was similar to her priors.  Not the sudden or worst headache of her life.  It was a 3 out of 10.  She is reports that the headache is now since gone away.  She also reports some mild shortness of breath earlier today.  Denies really any chest pain.  She reports have a history of elevated calciums and she was worried that her calcium level was superhigh and started googling and she saw the elevated calcium levels can increase risk for heart attack therefore she was worried about her symptoms and came in.  She reports near resolution of all symptoms at this time.  Physical Exam   Triage Vital Signs: ED Triage Vitals [12/29/22 1732]  Enc Vitals Group     BP (!) 123/91     Pulse Rate 70     Resp 18     Temp 98.4 F (36.9 C)     Temp src      SpO2 99 %     Weight      Height      Head Circumference      Peak Flow      Pain Score 0     Pain Loc      Pain Edu?      Excl. in [place]?     Most recent vital signs: Vitals:   12/29/22 1732 12/29/22 1902  BP: (!) 123/91 (!) 178/93  Pulse: 70   Resp: 18 14  Temp: 98.4 F (36.9 C)   SpO2: 99%      General: Awake, no distress.  CV:  Good peripheral perfusion.  Resp:  Normal effort.  Clear lungs Abd:  No distention.  Soft and nontender Other:   No swelling in legs.  No calf tenderness CN 2-12 intact- equal strength in arms and legs. Pupisl reactive  ED Results / Procedures / Treatments   Labs (all labs ordered are  listed, but only abnormal results are displayed) Labs Reviewed  BASIC METABOLIC PANEL - Abnormal; Notable for the following components:      Result Value   Glucose, Bld 103 (*)    Calcium 10.7 (*)    All other components within normal limits  CBC  URINALYSIS, ROUTINE W REFLEX MICROSCOPIC  TSH  CBG MONITORING, ED  POC URINE PREG, ED  TROPONIN I (HIGH SENSITIVITY)  TROPONIN I (HIGH SENSITIVITY)     EKG  My interpretation of EKG:  Normal sinus rate of 70 without any ST elevation, some T wave inversions in V4 V5 lead to, normal intervals  RADIOLOGY Patient declined chest x-ray given reports resolution of symptoms   PROCEDURES:  Critical Care performed: No  Procedures   MEDICATIONS ORDERED IN ED: Medications  sodium chloride 0.9 % bolus 1,000 mL (1,000 mLs Intravenous New Bag/Given 12/29/22 2033)     IMPRESSION / MDM / ASSESSMENT AND PLAN / ED COURSE  I reviewed the triage vital signs and the nursing notes.   Patient's  presentation is most consistent with acute presentation with potential threat to life or bodily function.   Differential is dehydration, electrolyte abnormalities, Elevated calcium, ACS.  Patient does have a few T wave inversions discussed with patient that some of these are from prior some of them may be new.  Given the shortness of breath although is now resolved we discussed different options including CT scan or given patient's overall low risk we consider D-dimer given pt is on estradoil..  Patient did not want to do the CT scan unless necessary so would prefer to do the D-dimer to evaluate for PE which I think is reasonable given overall low suspicion given symptoms have seemed to have resolved.  CBC is normal.  BMP shows calcium level of 10.7.  Troponin is negative.  Pregnancy test was negative.  Urine without evidence of UTI.  Small hemoglobin noted and discussed with patient she can follow this up outpatient.  Troponins negative x 2 D-dimer negative.  Tsh normal   Discussed with patient's results.  She remains asymptomatic and would prefer discharge home at this time   Will give patient referral for cardiology and she can follow-up for discussion of Holter monitor or cardiac testing if necessary.    FINAL CLINICAL IMPRESSION(S) / ED DIAGNOSES   Final diagnoses:  Serum calcium elevated  Chest pain of uncertain etiology  Shortness of breath     Rx / DC Orders   ED Discharge Orders          Ordered    Ambulatory referral to Cardiology        12/29/22 2125             Note:  This document was prepared using Dragon voice recognition software and may include unintentional dictation errors.   Vanessa [place], MD 12/29/22 2125    Vanessa [place], MD 12/29/22 2126    Vanessa [place], MD 12/29/22 2133

## 2022-12-30 ENCOUNTER — Telehealth: Payer: Self-pay

## 2022-12-30 NOTE — Progress Notes (Signed)
Cardiology Office Note:    Date:  12/31/2022   ID:  Ana Freeman, DOB [birth year], MRN [id number]  PCP:  Waunita Schooner, [facility] Cardiologist:  Skeet Latch, MD     Referring MD: Waunita Schooner, MD   Chief Complaint  Patient presents with   Palpitations    Seen for Dr. Oval Linsey     History of Present Illness:    Ana Freeman is a 52 y.o. female with a hx of primary hyperparathyroidism, hypercalcemia, hyperkalemia (seen by nephrology and given [facility] 12/10/21), iron deficiency anemia, hyperlipidemia, chest pain of uncertain etiology, obesity, GERD.  Initially established care with our office in 2019 for the evaluation of chest pain. Her chest pain has been intermittent since 2015.  Prior to that she had been a patient of Dr. Irven Shelling since 2016, a Myoview at that time was negative for ischemia, EF 65%.  Dr. Einar Gip offered her a cardiac catheterization for definitive answers however she ultimately declined.  Previously seen in our office by Dr. Oval Linsey on 12/11/2021, at that time she continued to experience chest pain that was not felt to be cardiac in nature.  The decision was made to repeat CT coronary for reevaluation, however she eventually canceled the scan as it was cost prohibitive.  12/29/2022 evaluated in the ED with concerns of dizziness, headache, shortness of breath.  She was concerned that her calcium level may be elevated as she began to google her symptoms (10.7 Ca), her symptoms had largely resolved by the time she arrived at the ED.  Troponin was negative D-dimer was negative TSH was normal.  She was discharged home.  She presents today for follow up of her palpitations and preoperative surgical clearance for upcoming left inferior parathyroidectomy (scheduled with Dr. Harlow Asa on 01/11/2023). She endorses high stress related to uncertainties surrounding her upcoming surgery and feels a lot of her symptoms could be associated with her  hyperparathyroidism and pursuant stress. She was recently on Wellbutrin for anxiety/depression/stress and this caused her palpitations to increase, she discussed with her PCP and they agreed to stop it. She endorses chest pain that is largely unchanged over the last few years. She denies SOB, dizziness, orthopnea, PND, or pedal edema.   We reviewed her most recent cardiac examination, and overall her tests have been reassuring.    Past Medical History:  Diagnosis Date   Anemia    Brain lesion    Closed compression fracture of body of L1 vertebra (HCC) 07/06/2019   GERD (gastroesophageal reflux disease)    Hyperlipidemia 07/19/2018   Nondisplaced fracture of posterior process of right talus 07/09/2019   Numbness of lower extremity    Parathyroid abnormality [facility])     Past Surgical History:  Procedure Laterality Date   ANKLE CLOSED REDUCTION Right 07/05/2019   Procedure: Closed Reduction Ankle;  Surgeon: Hiram Gash, MD;  Location: [place];  Service: Orthopedics;  Laterality: Right;   COLONOSCOPY     pt denies    CYSTOSCOPY N/A 11/19/2020   Procedure: CYSTOSCOPY;  Surgeon: Everett Graff, MD;  Location: [facility];  Service: Gynecology;  Laterality: N/A;   DILATATION & CURETTAGE/HYSTEROSCOPY WITH MYOSURE N/A 10/24/2019   Procedure: DILATATION & CURETTAGE/HYSTEROSCOPY WITH MYOSURE;  Surgeon: Waymon Amato, MD;  Location: [place];  Service: Gynecology;  Laterality: N/A;   DILATION AND EVACUATION N/A 02/12/2016   Procedure: DILATATION AND EVACUATION WITH SUCTION ;  Surgeon: Eldred Manges, MD;  Location: [facility];  Service: Gynecology;  Laterality: N/A;   EXTERNAL FIXATION LEG Left 07/05/2019   Procedure: EXTERNAL FIXATION LEFT ANKLE;  Surgeon: Hiram Gash, MD;  Location: [place];  Service: Orthopedics;  Laterality: Left;   EXTERNAL FIXATION REMOVAL Left 07/19/2019   Procedure: REMOVAL EXTERNAL FIXATION LEG;  Surgeon: Shona Needles, MD;  Location: [place];  Service:  Orthopedics;  Laterality: Left;   HYSTEROSCOPY WITH RESECTOSCOPE N/A 02/12/2016   Procedure: HYSTEROSCOPIC MYOMECTOMY WITH RESECTOSCOPE;  Surgeon: Eldred Manges, MD;  Location: [place] [facility];  Service: Gynecology;  Laterality: N/A;   I & D EXTREMITY Left 07/05/2019   Procedure: IRRIGATION AND DEBRIDEMENT left ankle ;  Surgeon: Hiram Gash, MD;  Location: [place];  Service: Orthopedics;  Laterality: Left;   I & D EXTREMITY Left 07/08/2019   Procedure: IRRIGATION AND DEBRIDEMENT EXTREMITY WITH ADJUSTMENT OF EXTERNAL FIXATOR;  Surgeon: Shona Needles, MD;  Location: [place];  Service: Orthopedics;  Laterality: Left;   LAPAROSCOPIC VAGINAL HYSTERECTOMY WITH SALPINGECTOMY Bilateral 11/19/2020   Procedure: LAPAROSCOPIC ASSISTED VAGINAL HYSTERECTOMY WITH SALPINGO-OOPHARECTOMY;  Surgeon: Everett Graff, MD;  Location: [facility];  Service: Gynecology;  Laterality: Bilateral;   MASS EXCISION Right 08/30/2017   Procedure: EXCISION RIGHT UPPER ARM MASS;  Surgeon: Coralie Keens, MD;  Location: [place];  Service: General;  Laterality: Right;   MYOMECTOMY N/A 02/12/2016   Procedure: ABDOMINAL MYOMECTOMY;  Surgeon: Eldred Manges, MD;  Location: [place] [facility];  Service: Gynecology;  Laterality: N/A;   OPEN REDUCTION INTERNAL FIXATION (ORIF) TIBIA/FIBULA FRACTURE Left 07/19/2019   Procedure: OPEN REDUCTION INTERNAL FIXATION (ORIF) LEFT PILON;  Surgeon: Shona Needles, MD;  Location: [place];  Service: Orthopedics;  Laterality: Left;   ORIF ANKLE FRACTURE Right 07/08/2019   Procedure: OPEN REDUCTION INTERNAL FIXATION (ORIF) ANKLE FRACTURE;  Surgeon: Shona Needles, MD;  Location: [place];  Service: Orthopedics;  Laterality: Right;   UPPER GI ENDOSCOPY     WISDOM TOOTH EXTRACTION      Current Medications: Current Meds  Medication Sig   clobetasol ointment (TEMOVATE) 4.08 % Apply 1 Application topically 2 (two) times daily.   estradiol (ESTRACE) 0.5 MG tablet Take 1 mg by mouth daily.    finasteride (PROSCAR) 5 MG tablet Take 2.5 mg by mouth daily.   pantoprazole (PROTONIX) 20 MG tablet Take 20 mg by mouth daily.   valACYclovir (VALTREX) 500 MG tablet Take 500 mg by mouth as needed (BREAKOUTS).     Allergies:   Chlorhexidine gluconate   Social History   Socioeconomic History   Marital status: Married    Spouse name: Shawn   Number of children: 0   Years of education: Masters   Highest education level: Not on file  Occupational History   Not on file  Tobacco Use   Smoking status: Never   Smokeless tobacco: Never  Vaping Use   Vaping Use: Never used  Substance and Sexual Activity   Alcohol use: Yes    Alcohol/week: 0.0 standard drinks of alcohol    Comment: 5 times a year   Drug use: Never   Sexual activity: Yes  Other Topics Concern   Not on file  Social History Narrative   10/28/20   From: [place] originally, moved in [place]: with husband, Shawn (2021)   Work: [employer] at [employer]       Family: mom is Solicitor, aunt, sister, cousin nearby too      Enjoys: dance, roller  skate, eating      Exercise: 5 days a week - youtube dance video, [employer] weight lifting    Diet: eat anything, tries to eat healthy - on weight watchers       Safety   Seat belts: Yes    Guns: No   Safe in relationships: Yes    Social Determinants of Radio broadcast assistant Strain: Not on file  Food Insecurity: Not on file  Transportation Needs: Not on file  Physical Activity: Not on file  Stress: Not on file  Social Connections: Not on file     Family History: The patient's family history includes Alzheimer's disease in her paternal grandfather; Anemia in her sister; Congenital heart disease in her father; Diabetes in her father; Hypertension in her mother; Lung cancer in her maternal grandmother; Ovarian cancer in her paternal grandmother. There is no history of Colon cancer, Esophageal cancer, Rectal cancer, or Stomach cancer.  ROS:   Please see the  history of present illness.     All other systems reviewed and are negative.  EKGs/Labs/Other Studies Reviewed:    The following studies were reviewed today:  03/03/21 11 day zio - predominantly SR, HR 53-185, average 87 bpm, no significant arrythmias.   09/04/18 CT coronary -  calcium score of 0, normal right dominant coronary arteries, no CAD.   03/14/2015 myocardial perfusion - imaging was normal, EF 65%  EKG:  EKG is not ordered today.  She had one in the ED on 12/30/22, reviewed, SR, HR 70 bpm, T wave inversion, consistent with previous EKG tracing.   Recent Labs: 03/30/2022: ALT 25 12/29/2022: BUN 12; Creatinine, Ser 0.83; Hemoglobin 13.7; Platelets 252; Potassium 4.9; Sodium 135; TSH 1.028  Recent Lipid Panel    Component Value Date/Time   CHOL 198 03/04/2022 0744   CHOL 217 (H) 10/05/2018 0848   TRIG 55.0 03/04/2022 0744   HDL 59.20 03/04/2022 0744   HDL 68 10/05/2018 0848   CHOLHDL 3 03/04/2022 0744   VLDL 11.0 03/04/2022 0744   LDLCALC 128 (H) 03/04/2022 0744   LDLCALC 138 (H) 10/05/2018 0848     Risk Assessment/Calculations:                Physical Exam:    VS:  BP 110/70   Pulse 67   Ht '5\' 4"'$  (1.626 m)   Wt 195 lb (88.5 kg)   LMP 11/05/2020   BMI 33.47 kg/m     Wt Readings from Last 3 Encounters:  12/31/22 195 lb (88.5 kg)  11/01/22 193 lb 8 oz (87.8 kg)  08/20/22 186 lb 9.6 oz (84.6 kg)     GEN:  Well nourished, well developed in no acute distress HEENT: Normal NECK: No JVD; No carotid bruits LYMPHATICS: No lymphadenopathy CARDIAC: RRR, no murmurs, rubs, gallops RESPIRATORY:  Clear to auscultation without rales, wheezing or rhonchi  ABDOMEN: Soft, non-tender, non-distended MUSCULOSKELETAL: RLE no edema, LLE non-pitting edema inferior to previous traumatic break to left medial ankle; No deformity  SKIN: Warm and dry NEUROLOGIC:  Alert and oriented x 3 PSYCHIATRIC:  Normal affect   ASSESSMENT:    1. Palpitations   2. Chest pain of uncertain  etiology   3. Mixed hyperlipidemia   4. Primary hyperparathyroidism ([place])   5. Preoperative cardiovascular examination   6. Anxiety about health    PLAN:    In order of problems listed above:  Palpitations/chest pain of uncertain etiology - Chest pain atypical for angina, recent EKG non-acute, no  indications for ischemic eval. Prior monitor 02/2021 no significant arrhythmia, suspect etiology of symptoms is r/t stress. Shared decision making regarding PRN metoprolol tartrate vs metoprolol succinate vs repeat coronary calcium score. At this time, she does not want to proceed with any further workup or adding medications. She is hopeful that her symptoms are associated with ongoing stress related to upcoming surgery and is hopeful that once her surgery is complete, that her symptoms will subside somewhat.   Hyperlipidemia -LDL 128 on 03/04/2022, The 10-year ASCVD risk score (Arnett DK, et al., 2019) is: 1.1%. Continue to monitor. Discussed dietary changes and increasing physical activity.  Primary hyperparathyroidism/preoperative surgical clearance - left inferior parathyroidectomy (scheduled with Dr. Harlow Asa on 01/11/2023). According to the Revised Cardiac Risk Index (RCRI), her Perioperative Risk of Major Cardiac Event is (%): 0.4. Her Functional Capacity in METs is: 9.89 according to the Duke Activity Status Index (DASI). Per AHA/ACC guidelines acceptable risk for planned procedure without additional cardiovascular testing. Will route to surgical team so they are aware.   Anxiety/Depression/Stress - She verbalizes feelings of stress and depression that seem to be exacerbated by recent changes in her health. Recently tried Wellbutrin, but this worsened her PVC burden and it was ultimately stopped. She plans to reach out to her PCP about other treatment options.            Medication Adjustments/Labs and Tests Ordered: Current medicines are reviewed at length with the patient today.  Concerns  regarding medicines are outlined above.  No orders of the defined types were placed in this encounter.  No orders of the defined types were placed in this encounter.   Patient Instructions  Medication Instructions:  Continue your current medications.   If you start experiencing worsening palpitations and wish to have a prescription for an as-needed beta blocker to prevent palpitations please let us know and we can send in a prescription.   *If you need a refill on your cardiac medications before your next appointment, please call your pharmacy*  Follow-Up: At [facility], you and your health needs are our priority.  As part of our continuing mission to provide you with exceptional heart care, we have created designated Provider Care Teams.  These Care Teams include your primary Cardiologist (physician) and Advanced Practice Providers (APPs -  Physician Assistants and Nurse Practitioners) who all work together to provide you with the care you need, when you need it.  We recommend signing up for the patient portal called "MyChart".  Sign up information is provided on this After Visit Summary.  MyChart is used to connect with patients for Virtual Visits (Telemedicine).  Patients are able to view lab/test results, encounter notes, upcoming appointments, etc.  Non-urgent messages can be sent to your provider as well.   To learn more about what you can do with MyChart, go to [email or web address].    Your next appointment:   As needed with Dr. Oval Linsey  Other Instructions  To prevent palpitations: Make sure you are adequately hydrated.  Avoid and/or limit caffeine containing beverages like soda or tea. Exercise regularly.  Manage stress well. Some over the counter medications can cause palpitations such as Benadryl, AdvilPM, TylenolPM. Regular Advil or Tylenol do not cause palpitations.      Signed, Trudi Ida, NP  12/31/2022 11:51 AM    [place]

## 2022-12-30 NOTE — Telephone Encounter (Signed)
Transition Care Management Unsuccessful Follow-up Telephone Call  Date of discharge and from where:  [facility] 12/29/2022  Attempts:  1st Attempt  Reason for unsuccessful TCM follow-up call:  Left voice message Juanda Crumble, [place] Direct Dial [phone]

## 2022-12-31 ENCOUNTER — Ambulatory Visit (HOSPITAL_BASED_OUTPATIENT_CLINIC_OR_DEPARTMENT_OTHER): Payer: BC Managed Care – PPO | Admitting: Cardiology

## 2022-12-31 ENCOUNTER — Telehealth (HOSPITAL_BASED_OUTPATIENT_CLINIC_OR_DEPARTMENT_OTHER): Payer: Self-pay

## 2022-12-31 ENCOUNTER — Encounter (HOSPITAL_BASED_OUTPATIENT_CLINIC_OR_DEPARTMENT_OTHER): Payer: Self-pay | Admitting: Cardiology

## 2022-12-31 VITALS — BP 110/70 | HR 67 | Ht 64.0 in | Wt 195.0 lb

## 2022-12-31 DIAGNOSIS — R079 Chest pain, unspecified: Secondary | ICD-10-CM

## 2022-12-31 DIAGNOSIS — E782 Mixed hyperlipidemia: Secondary | ICD-10-CM

## 2022-12-31 DIAGNOSIS — Z01810 Encounter for preprocedural cardiovascular examination: Secondary | ICD-10-CM

## 2022-12-31 DIAGNOSIS — F418 Other specified anxiety disorders: Secondary | ICD-10-CM

## 2022-12-31 DIAGNOSIS — E210 Primary hyperparathyroidism: Secondary | ICD-10-CM | POA: Diagnosis not present

## 2022-12-31 DIAGNOSIS — R4589 Other symptoms and signs involving emotional state: Secondary | ICD-10-CM

## 2022-12-31 DIAGNOSIS — R002 Palpitations: Secondary | ICD-10-CM

## 2022-12-31 NOTE — Patient Instructions (Signed)
Medication Instructions:  Continue your current medications.   If you start experiencing worsening palpitations and wish to have a prescription for an as-needed beta blocker to prevent palpitations please let us know and we can send in a prescription.   *If you need a refill on your cardiac medications before your next appointment, please call your pharmacy*  Follow-Up: At [facility], you and your health needs are our priority.  As part of our continuing mission to provide you with exceptional heart care, we have created designated Provider Care Teams.  These Care Teams include your primary Cardiologist (physician) and Advanced Practice Providers (APPs -  Physician Assistants and Nurse Practitioners) who all work together to provide you with the care you need, when you need it.  We recommend signing up for the patient portal called "MyChart".  Sign up information is provided on this After Visit Summary.  MyChart is used to connect with patients for Virtual Visits (Telemedicine).  Patients are able to view lab/test results, encounter notes, upcoming appointments, etc.  Non-urgent messages can be sent to your provider as well.   To learn more about what you can do with MyChart, go to [email or web address].    Your next appointment:   As needed with Dr. Oval Linsey  Other Instructions  To prevent palpitations: Make sure you are adequately hydrated.  Avoid and/or limit caffeine containing beverages like soda or tea. Exercise regularly.  Manage stress well. Some over the counter medications can cause palpitations such as Benadryl, AdvilPM, TylenolPM. Regular Advil or Tylenol do not cause palpitations.

## 2022-12-31 NOTE — Telephone Encounter (Addendum)
Left message for patient to call back      ----- Message from Loel Dubonnet, NP sent at 12/26/2022  5:05 PM EST ----- Pt of TR seen by virtual visit PCP for palpitations. Likely related to new wellbutrin. Can we call to check in on patient? If still with concerning symptoms offer OV. If feeling better, no need for appt. Last seen 2022 by TR recommended f/u PRN.   TY! ----- Message ----- From: Apolonio Schneiders, FNP Sent: 12/17/2022   8:05 PM EST To: Skeet Latch, MD

## 2022-12-31 NOTE — Telephone Encounter (Signed)
Transition Care Management Follow-up Telephone Call Date of discharge and from where: [facility] 12/29/2022 How have you been since you were released from the hospital? okay Any questions or concerns? No  Items Reviewed: Did the pt receive and understand the discharge instructions provided? Yes  Medications obtained and verified? Yes  Other? No  Any new allergies since your discharge? No  Dietary orders reviewed? Yes Do you have support at home? Yes   Home Care and Equipment/Supplies: Were home health services ordered? no If so, what is the name of the agency? N/a  Has the agency set up a time to come to the patient's home? no Were any new equipment or medical supplies ordered?  No What is the name of the medical supply agency? N/a Were you able to get the supplies/equipment? no Do you have any questions related to the use of the equipment or supplies? No  Functional Questionnaire: (I = Independent and D = Dependent) ADLs: I  Bathing/Dressing- I  Meal Prep- I  Eating- I  Maintaining continence- I  Transferring/Ambulation- I  Managing Meds- I  Follow up appointments reviewed:  PCP Hospital f/u appt confirmed? Yes  Scheduled to see dR cODY on 12/31/2022 @ 10:05. [facility] f/u appt confirmed? No   Are transportation arrangements needed? No  If their condition worsens, is the pt aware to call PCP or go to the Emergency Dept.? Yes Was the patient provided with contact information for the PCP's office or ED? Yes Was to pt encouraged to call back with questions or concerns? Yes Juanda Crumble, LPN [place] Direct Dial [phone]

## 2023-01-03 NOTE — Progress Notes (Addendum)
COVID Vaccine Completed:  Yes  Date of COVID positive in last 90 days:  No  PCP - Dion Body, PCP left practice Cardiologist - Skeet Latch, MD  Cardiac clearance in Epic dated 12-31-22 by Venia Carbon, NP  Chest x-ray - N/A EKG - 12-29-22 Epic Stress Test - 03-14-15 Epic ECHO - N/A Cardiac Cath - N/A Pacemaker/ICD device last checked: Spinal Cord Stimulator:  N/A Long Term Monitor - 2022 Epic Coronary CT 09-04-18 Epic  Bowel Prep - N/A  Sleep Study - N/A CPAP -   Fasting Blood Sugar - N/A Checks Blood Sugar _____ times a day  Last dose of GLP1 agonist-  N/A GLP1 instructions:  N/A   Last dose of SGLT-2 inhibitors-  N/A SGLT-2 instructions: N/A  Blood Thinner Instructions:  N/A Aspirin Instructions: Last Dose:  Activity level:  Can go up a flight of stairs and perform activities of daily living without stopping and without symptoms of chest pain or shortness of breath. Able to exercise without symptoms  Anesthesia review:  Followed by cardiology for chest pain and palpitations.  Patient states that symptoms resolved after stopping Bupropion.  Patient denies shortness of breath, fever, cough and chest pain at PAT appointment  (completed over the (phone)  Patient verbalized understanding of instructions that were given to them at the PAT appointment. Patient was also instructed that they will need to review over the PAT instructions again at home before surgery.

## 2023-01-03 NOTE — Telephone Encounter (Signed)
Returned call to patient and she states she is feeling much better and would not like an appointment at this time!

## 2023-01-03 NOTE — Patient Instructions (Addendum)
SURGICAL WAITING ROOM VISITATION Patients having surgery or a procedure may have no more than 2 support people in the waiting area - these visitors may rotate.    If the patient needs to stay at the hospital during part of their recovery, the visitor guidelines for inpatient rooms apply. Pre-op nurse will coordinate an appropriate time for 1 support person to accompany patient in pre-op.  This support person may not rotate.    Please refer to the [facility] website for the visitor guidelines for Inpatients (after your surgery is over and you are in a regular room).   Due to an increase in RSV and influenza rates and associated hospitalizations, children ages 89 and under may not visit patients in [place].     Your procedure is scheduled on: 01-11-23   Report to [facility] Main Entrance    Report to admitting at 5:15 AM   Call this number if you have problems the morning of surgery [phone]   Do not eat food :After Midnight.   After Midnight you may have the following liquids until 4:30 AM DAY OF SURGERY  Water Non-Citrus Juices (without pulp, NO RED) Carbonated Beverages Black Coffee (NO MILK/CREAM OR CREAMERS, sugar ok)  Clear Tea (NO MILK/CREAM OR CREAMERS, sugar ok) regular and decaf                             Plain Jell-O (NO RED)                                           Fruit ices (not with fruit pulp, NO RED)                                     Popsicles (NO RED)                                                               Sports drinks like Gatorade (NO RED)                       If you have questions, please contact your surgeon's office.   FOLLOW ANY ADDITIONAL PRE OP INSTRUCTIONS YOU RECEIVED FROM YOUR SURGEON'S OFFICE!!!     Oral Hygiene is also important to reduce your risk of infection.                                    Remember - BRUSH YOUR TEETH THE MORNING OF SURGERY WITH YOUR REGULAR TOOTHPASTE   Do NOT smoke after  Midnight   Take these medicines the morning of surgery with A SIP OF WATER:   Finasteride  Pantoprazole  Valacyclovir if needed                              You may not have any metal on your body including hair pins, jewelry, and body piercing  Do not wear make-up, lotions, powders, perfumes or deodorant  Do not wear nail polish including gel and S&S, artificial/acrylic nails, or any other type of covering on natural nails including finger and toenails. If you have artificial nails, gel coating, etc. that needs to be removed by a nail salon please have this removed prior to surgery or surgery may need to be canceled/ delayed if the surgeon/ anesthesia feels like they are unable to be safely monitored.   Do not shave  48 hours prior to surgery.    Do not bring valuables to the hospital. [place].   Contacts, dentures or bridgework may not be worn into surgery.  DO NOT [place]. PHARMACY WILL DISPENSE MEDICATIONS LISTED ON YOUR MEDICATION LIST TO YOU DURING YOUR ADMISSION [place]!    Patients discharged on the day of surgery will not be allowed to drive home.  Someone NEEDS to stay with you for the first 24 hours after anesthesia.              Please read over the following fact sheets you were given: IF [place]  If you received a COVID test during your pre-op visit  it is requested that you wear a mask when out in public, stay away from anyone that may not be feeling well and notify your surgeon if you develop symptoms. If you test positive for Covid or have been in contact with anyone that has tested positive in the last 10 days please notify you surgeon.  [place] - Preparing for Surgery Before surgery, you can play an important role.  Because skin is not sterile, your skin needs to be as free of germs as possible.  You  can reduce the number of germs on your skin by washing with an antibacterial soap.  Please follow these instructions carefully:  1.  Shower with an antibacterial soap the night before surgery and the  morning of surgery.  2.  If you choose to wash your hair, wash your hair first as usual with your normal  shampoo.  3.  After you shampoo, rinse your hair and body thoroughly to remove the shampoo.                             4.  Wash thoroughly, paying special attention to the area where your  surgery  will be performed.  5.  Thoroughly rinse your body with warm water from the neck down.  6.   Pat yourself dry with a clean towel.            7.  Wear clean pajamas.            8.  Place clean sheets on your bed the night of your first shower and do not  sleep with pets. Day of Surgery : Do not apply any lotions/deodorants the morning of surgery.  Please wear clean clothes to the hospital/surgery center.  FAILURE TO FOLLOW THESE INSTRUCTIONS MAY RESULT IN THE CANCELLATION OF YOUR SURGERY  PATIENT SIGNATURE_________________________________  NURSE SIGNATURE__________________________________  ________________________________________________________________________

## 2023-01-07 ENCOUNTER — Encounter (HOSPITAL_COMMUNITY): Payer: Self-pay | Admitting: Surgery

## 2023-01-07 ENCOUNTER — Other Ambulatory Visit: Payer: Self-pay

## 2023-01-07 ENCOUNTER — Encounter (HOSPITAL_COMMUNITY)
Admission: RE | Admit: 2023-01-07 | Discharge: 2023-01-07 | Disposition: A | Discharge status: Home / Self Care | Payer: BC Managed Care – PPO | Source: Ambulatory Visit | Attending: Surgery | Admitting: Surgery

## 2023-01-07 ENCOUNTER — Encounter (HOSPITAL_COMMUNITY): Payer: Self-pay

## 2023-01-07 DIAGNOSIS — E210 Primary hyperparathyroidism: Secondary | ICD-10-CM | POA: Diagnosis present

## 2023-01-07 DIAGNOSIS — E042 Nontoxic multinodular goiter: Secondary | ICD-10-CM | POA: Diagnosis present

## 2023-01-07 HISTORY — DX: Hyperparathyroidism, unspecified: E21.3

## 2023-01-07 HISTORY — DX: Anxiety disorder, unspecified: F41.9

## 2023-01-07 NOTE — H&P (Signed)
REFERRING PHYSICIAN: Carrolyn Leigh, MD  PROVIDER: Saeed Toren Charlotta Newton, MD   Chief Complaint: New Consultation (Primary hyperparathyroidism)  History of Present Illness:  Patient presents on referral from her endocrinologist, Dr. Vivia Ewing, for surgical evaluation and management of primary hyperparathyroidism. Patient first noted a tingling sensation. She was evaluated by her primary care physician and noted to have elevated serum calcium levels. Patient underwent nuclear medicine parathyroid scan on January 01, 2022. This showed abnormal sestamibi retention at the left inferior position consistent with a parathyroid adenoma. Patient has had a previous CT scan which also demonstrated a mass at that point corresponding to the signal seen on sestamibi scan consistent with parathyroid adenoma. Patient subsequently underwent an ultrasound examination on April 02, 2022. This showed multiple thyroid nodules. Also noted was a 2 cm nodule deep to the left thyroid lobe with a polar vessel felt to be consistent with parathyroid adenoma. Of the multiple thyroid nodules, 2 of these underwent fine-needle aspiration biopsy with benign cytopathology. Recent laboratories show persistent hypercalcemia with calcium levels ranging from 10.9 to as high as 11.8. Recent intact PTH level was elevated at 149. 24-hour urine collection for calcium was 225. Patient denies any complications. She has had some bone and joint pain. She describes some memory issues and "brain fog". There is no family history of parathyroid disease. Patient has had no prior head or neck surgery. 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  Review of Systems: A complete review of systems was obtained from the patient. I have reviewed this information and discussed as appropriate with the patient. See HPI as well for other ROS.  Review of Systems  Constitutional: Negative.  HENT: Negative.  Eyes: Negative.   Respiratory: Negative.  Cardiovascular: Negative.  Gastrointestinal: Negative.  Genitourinary: Negative.  Musculoskeletal: Positive for joint pain.  Skin: Negative.  Neurological: Positive for tingling.  Endo/Heme/Allergies: Negative.  Psychiatric/Behavioral: Positive for memory loss.    Medical History: History reviewed. No pertinent past medical history.  Patient Active Problem List  Diagnosis  Primary hyperparathyroidism (CMS-HCC)  Multiple thyroid nodules   History reviewed. No pertinent surgical history.   Not on File  No current outpatient medications on file prior to visit.   No current facility-administered medications on file prior to visit.   History reviewed. No pertinent family history.   Social History   Tobacco Use  Smoking Status Not on file  Smokeless Tobacco Not on file    Social History   Socioeconomic History  Marital status: Married   Objective:   Physical Exam   GENERAL APPEARANCE Comfortable, no acute issues Development: normal Gross deformities: none  SKIN Rash, lesions, ulcers: none Induration, erythema: none Nodules: none palpable  EYES Conjunctiva and lids: normal Pupils: equal and reactive  EARS, NOSE, MOUTH, THROAT External ears: no lesion or deformity External nose: no lesion or deformity Hearing: grossly normal  NECK Symmetric: yes Trachea: midline Thyroid: Thyroid gland is mildly enlarged and diffusely nodular bilaterally. There is no associated lymphadenopathy. There is no tenderness.  CHEST Respiratory effort: normal Retraction or accessory muscle use: no Breath sounds: normal bilaterally Rales, rhonchi, wheeze: none  CARDIOVASCULAR Auscultation: regular rhythm, normal rate Murmurs: none Pulses: radial pulse 2+ palpable Lower extremity edema: none  ABDOMEN Not assessed  GENITOURINARY/RECTAL Not assessed  MUSCULOSKELETAL Station and gait: normal Digits and nails: no clubbing or cyanosis Muscle  strength: grossly normal all extremities Range of motion: grossly normal all extremities Deformity: none  LYMPHATIC Cervical: none palpable Supraclavicular: none palpable  PSYCHIATRIC Oriented to person, place, and time: yes Mood and affect: normal for situation Judgment and insight: appropriate for situation   Assessment and Plan:   Primary hyperparathyroidism (CMS-HCC) Multiple thyroid nodules  Patient is referred by her endocrinologist for surgical evaluation and management of primary hyperparathyroidism.  Patient provided with a copy of "Parathyroid Surgery: Treatment for Your Parathyroid Gland Problem", published by [employer], 12 pages. Book reviewed and explained to patient during visit today.  Today we reviewed her clinical history. We reviewed her imaging studies and her laboratory studies. It appears that there is a left inferior parathyroid adenoma. This appears to be single gland disease. Today we discussed minimally invasive parathyroidectomy as an outpatient surgical procedure. We discussed the size and location of the surgical incision. We discussed potential complications including multi gland disease and recurrent laryngeal nerve injury. We discussed the incision and the suture material to be employed, as the patient has some concerns about possible keloid formation. We discussed her postoperative recovery and return to work and activities. The patient understands and wishes to proceed with surgery in the near future.   Armandina Gemma, MD [facility] [place] practice Office: [phone]

## 2023-01-10 NOTE — Progress Notes (Signed)
Anesthesia Chart Review   Case: [id number] Date/Time: 01/11/23 0715   Procedure: LEFT INFERIOR PARATHYROIDECTOMY (Left)   Anesthesia type: General   Pre-op diagnosis: PRIMARY HYPERPARATHYROIDISM   Location: [facility] ROOM 07 / [facility]   Surgeons: Armandina Gemma, MD       DISCUSSION:51 y.o. never smoker with h/o GERD, primary hyperparathyroidism scheduled for above procedure 01/11/2023 with Dr. Armandina Gemma.   03/03/21 11 day zio - predominantly SR, HR 53-185, average 87 bpm, no significant arrythmias.    09/04/18 CT coronary -  calcium score of 0, normal right dominant coronary arteries, no CAD.    03/14/2015 myocardial perfusion - imaging was normal, EF 65%  Pt last seen by cardiology 12/31/2022. Per OV note, "Primary hyperparathyroidism/preoperative surgical clearance - left inferior parathyroidectomy (scheduled with Dr. Harlow Asa on 01/11/2023). According to the Revised Cardiac Risk Index (RCRI), her Perioperative Risk of Major Cardiac Event is (%): 0.4. Her Functional Capacity in METs is: 9.89 according to the Duke Activity Status Index (DASI). Per AHA/ACC guidelines acceptable risk for planned procedure without additional cardiovascular testing. Will route to surgical team so they are aware."  Anticipate pt can proceed with planned procedure barring acute status change.   VS: Ht '5\' 4"'$  (1.626 m)   Wt 85.3 kg   LMP 11/05/2020   BMI 32.27 kg/m   PROVIDERS: Waunita Schooner, MD is PCP   Skeet Latch, MD is Cardiologist  LABS: Labs reviewed: Acceptable for surgery. (all labs ordered are listed, but only abnormal results are displayed)  Labs Reviewed - No data to display   IMAGES:   EKG:   CV:  Past Medical History:  Diagnosis Date   Anemia    Anxiety    Brain lesion    Closed compression fracture of body of L1 vertebra (HCC) 07/06/2019   GERD (gastroesophageal reflux disease)    Hyperlipidemia 07/19/2018   Hyperparathyroidism (HCC)    Nondisplaced fracture of posterior process of  right talus 07/09/2019   Numbness of lower extremity    Parathyroid abnormality [facility])     Past Surgical History:  Procedure Laterality Date   ANKLE CLOSED REDUCTION Right 07/05/2019   Procedure: Closed Reduction Ankle;  Surgeon: Hiram Gash, MD;  Location: [place];  Service: Orthopedics;  Laterality: Right;   COLONOSCOPY     pt denies    CYSTOSCOPY N/A 11/19/2020   Procedure: CYSTOSCOPY;  Surgeon: Everett Graff, MD;  Location: [facility];  Service: Gynecology;  Laterality: N/A;   DILATATION & CURETTAGE/HYSTEROSCOPY WITH MYOSURE N/A 10/24/2019   Procedure: DILATATION & CURETTAGE/HYSTEROSCOPY WITH MYOSURE;  Surgeon: Waymon Amato, MD;  Location: [facility];  Service: Gynecology;  Laterality: N/A;   DILATION AND EVACUATION N/A 02/12/2016   Procedure: DILATATION AND EVACUATION WITH SUCTION ;  Surgeon: Eldred Manges, MD;  Location: [facility];  Service: Gynecology;  Laterality: N/A;   EXTERNAL FIXATION LEG Left 07/05/2019   Procedure: EXTERNAL FIXATION LEFT ANKLE;  Surgeon: Hiram Gash, MD;  Location: [place];  Service: Orthopedics;  Laterality: Left;   EXTERNAL FIXATION REMOVAL Left 07/19/2019   Procedure: REMOVAL EXTERNAL FIXATION LEG;  Surgeon: Shona Needles, MD;  Location: [place];  Service: Orthopedics;  Laterality: Left;   HYSTEROSCOPY WITH RESECTOSCOPE N/A 02/12/2016   Procedure: HYSTEROSCOPIC MYOMECTOMY WITH RESECTOSCOPE;  Surgeon: Eldred Manges, MD;  Location: [facility];  Service: Gynecology;  Laterality: N/A;   I & D EXTREMITY Left 07/05/2019   Procedure: IRRIGATION AND DEBRIDEMENT left ankle ;  Surgeon:  Hiram Gash, MD;  Location: [place];  Service: Orthopedics;  Laterality: Left;   I & D EXTREMITY Left 07/08/2019   Procedure: IRRIGATION AND DEBRIDEMENT EXTREMITY WITH ADJUSTMENT OF EXTERNAL FIXATOR;  Surgeon: Shona Needles, MD;  Location: [place];  Service: Orthopedics;  Laterality: Left;   LAPAROSCOPIC VAGINAL HYSTERECTOMY WITH SALPINGECTOMY Bilateral 11/19/2020    Procedure: LAPAROSCOPIC ASSISTED VAGINAL HYSTERECTOMY WITH SALPINGO-OOPHARECTOMY;  Surgeon: Everett Graff, MD;  Location: [facility];  Service: Gynecology;  Laterality: Bilateral;   MASS EXCISION Right 08/30/2017   Procedure: EXCISION RIGHT UPPER ARM MASS;  Surgeon: Coralie Keens, MD;  Location: [place];  Service: General;  Laterality: Right;   MYOMECTOMY N/A 02/12/2016   Procedure: ABDOMINAL MYOMECTOMY;  Surgeon: Eldred Manges, MD;  Location: [facility];  Service: Gynecology;  Laterality: N/A;   OPEN REDUCTION INTERNAL FIXATION (ORIF) TIBIA/FIBULA FRACTURE Left 07/19/2019   Procedure: OPEN REDUCTION INTERNAL FIXATION (ORIF) LEFT PILON;  Surgeon: Shona Needles, MD;  Location: [place];  Service: Orthopedics;  Laterality: Left;   ORIF ANKLE FRACTURE Right 07/08/2019   Procedure: OPEN REDUCTION INTERNAL FIXATION (ORIF) ANKLE FRACTURE;  Surgeon: Shona Needles, MD;  Location: [place];  Service: Orthopedics;  Laterality: Right;   UPPER GI ENDOSCOPY     WISDOM TOOTH EXTRACTION      MEDICATIONS:  Cholecalciferol (VITAMIN D-3 PO)   clobetasol ointment (TEMOVATE) 0.05 %   estradiol (ESTRACE) 0.5 MG tablet   finasteride (PROSCAR) 5 MG tablet   Multiple Vitamins-Minerals (ADULT ONE DAILY GUMMIES PO)   pantoprazole (PROTONIX) 20 MG tablet   valACYclovir (VALTREX) 500 MG tablet   No current facility-administered medications for this encounter.    Konrad Felix Ward, PA-C [facility]Surgical Testing [phone]

## 2023-01-11 ENCOUNTER — Other Ambulatory Visit: Payer: Self-pay

## 2023-01-11 ENCOUNTER — Ambulatory Visit (HOSPITAL_COMMUNITY): Payer: BC Managed Care – PPO | Admitting: Anesthesiology

## 2023-01-11 ENCOUNTER — Encounter (HOSPITAL_COMMUNITY)
Admission: RE | Disposition: A | Discharge status: Home / Self Care | Payer: Self-pay | Source: Home / Self Care | Attending: Surgery

## 2023-01-11 ENCOUNTER — Encounter (HOSPITAL_COMMUNITY): Payer: Self-pay | Admitting: Surgery

## 2023-01-11 ENCOUNTER — Ambulatory Visit (HOSPITAL_COMMUNITY)
Admission: RE | Admit: 2023-01-11 | Discharge: 2023-01-11 | Disposition: A | Discharge status: Home / Self Care | Payer: BC Managed Care – PPO | Attending: Surgery | Admitting: Surgery

## 2023-01-11 ENCOUNTER — Ambulatory Visit (HOSPITAL_COMMUNITY): Payer: BC Managed Care – PPO | Admitting: Physician Assistant

## 2023-01-11 DIAGNOSIS — F419 Anxiety disorder, unspecified: Secondary | ICD-10-CM | POA: Diagnosis not present

## 2023-01-11 DIAGNOSIS — E042 Nontoxic multinodular goiter: Secondary | ICD-10-CM | POA: Diagnosis present

## 2023-01-11 DIAGNOSIS — K219 Gastro-esophageal reflux disease without esophagitis: Secondary | ICD-10-CM | POA: Insufficient documentation

## 2023-01-11 DIAGNOSIS — E210 Primary hyperparathyroidism: Secondary | ICD-10-CM | POA: Diagnosis not present

## 2023-01-11 HISTORY — PX: PARATHYROIDECTOMY: SHX19

## 2023-01-11 SURGERY — PARATHYROIDECTOMY
Anesthesia: General | Laterality: Left

## 2023-01-11 MED ORDER — DEXAMETHASONE SODIUM PHOSPHATE 10 MG/ML IJ SOLN
INTRAMUSCULAR | Status: AC
  Filled 2023-01-11: qty 1

## 2023-01-11 MED ORDER — FENTANYL CITRATE (PF) 100 MCG/2ML IJ SOLN
INTRAMUSCULAR | Status: AC
  Filled 2023-01-11: qty 2

## 2023-01-11 MED ORDER — LACTATED RINGERS IV SOLN: INTRAVENOUS | Status: DC

## 2023-01-11 MED ORDER — PROMETHAZINE HCL 25 MG/ML IJ SOLN: 6.2500 mg | INTRAMUSCULAR | Status: DC | PRN

## 2023-01-11 MED ORDER — OXYCODONE HCL 5 MG/5ML PO SOLN: 5.0000 mg | Freq: Once | ORAL | Status: AC | PRN

## 2023-01-11 MED ORDER — ONDANSETRON HCL 4 MG/2ML IJ SOLN
INTRAMUSCULAR | Status: AC
  Filled 2023-01-11: qty 2

## 2023-01-11 MED ORDER — KETOROLAC TROMETHAMINE 30 MG/ML IJ SOLN: 30.0000 mg | Freq: Once | INTRAMUSCULAR | Status: AC | PRN

## 2023-01-11 MED ORDER — LIDOCAINE HCL (CARDIAC) PF 100 MG/5ML IV SOSY
PREFILLED_SYRINGE | INTRAVENOUS | Status: DC | PRN
  Administered 2023-01-11: 100 mg via INTRAVENOUS

## 2023-01-11 MED ORDER — ORAL CARE MOUTH RINSE
15.0000 mL | Freq: Once | OROMUCOSAL | Status: AC
  Administered 2023-01-11: 15 mL via OROMUCOSAL

## 2023-01-11 MED ORDER — ACETAMINOPHEN 500 MG PO TABS
1000.0000 mg | ORAL_TABLET | Freq: Once | ORAL | Status: AC
  Administered 2023-01-11: 1000 mg via ORAL
  Filled 2023-01-11: qty 2

## 2023-01-11 MED ORDER — DEXAMETHASONE SODIUM PHOSPHATE 10 MG/ML IJ SOLN
INTRAMUSCULAR | Status: DC | PRN
  Administered 2023-01-11: 10 mg via INTRAVENOUS

## 2023-01-11 MED ORDER — OXYCODONE HCL 5 MG PO TABS
ORAL_TABLET | ORAL | Status: AC
  Administered 2023-01-11: 5 mg via ORAL
  Filled 2023-01-11: qty 1

## 2023-01-11 MED ORDER — LIDOCAINE HCL (PF) 2 % IJ SOLN
INTRAMUSCULAR | Status: AC
  Filled 2023-01-11: qty 5

## 2023-01-11 MED ORDER — ONDANSETRON HCL 4 MG/2ML IJ SOLN
INTRAMUSCULAR | Status: DC | PRN
  Administered 2023-01-11: 4 mg via INTRAVENOUS

## 2023-01-11 MED ORDER — OXYCODONE HCL 5 MG PO TABS: 5.0000 mg | ORAL_TABLET | Freq: Once | ORAL | Status: AC | PRN

## 2023-01-11 MED ORDER — MIDAZOLAM HCL 2 MG/2ML IJ SOLN
INTRAMUSCULAR | Status: AC
  Filled 2023-01-11: qty 2

## 2023-01-11 MED ORDER — FENTANYL CITRATE PF 50 MCG/ML IJ SOSY
PREFILLED_SYRINGE | INTRAMUSCULAR | Status: AC
  Administered 2023-01-11: 25 ug via INTRAVENOUS
  Filled 2023-01-11: qty 2

## 2023-01-11 MED ORDER — BUPIVACAINE HCL 0.25 % IJ SOLN
INTRAMUSCULAR | Status: DC | PRN
  Administered 2023-01-11: 10 mL

## 2023-01-11 MED ORDER — SUGAMMADEX SODIUM 200 MG/2ML IV SOLN
INTRAVENOUS | Status: DC | PRN
  Administered 2023-01-11: 200 mg via INTRAVENOUS

## 2023-01-11 MED ORDER — ROCURONIUM BROMIDE 10 MG/ML (PF) SYRINGE
PREFILLED_SYRINGE | INTRAVENOUS | Status: AC
  Filled 2023-01-11: qty 10

## 2023-01-11 MED ORDER — ROCURONIUM BROMIDE 100 MG/10ML IV SOLN
INTRAVENOUS | Status: DC | PRN
  Administered 2023-01-11: 60 mg via INTRAVENOUS

## 2023-01-11 MED ORDER — MIDAZOLAM HCL 2 MG/2ML IJ SOLN
INTRAMUSCULAR | Status: DC | PRN
  Administered 2023-01-11: 2 mg via INTRAVENOUS

## 2023-01-11 MED ORDER — PROPOFOL 10 MG/ML IV BOLUS
INTRAVENOUS | Status: AC
  Filled 2023-01-11: qty 20

## 2023-01-11 MED ORDER — FENTANYL CITRATE PF 50 MCG/ML IJ SOSY
25.0000 ug | PREFILLED_SYRINGE | INTRAMUSCULAR | Status: DC | PRN
  Administered 2023-01-11: 25 ug via INTRAVENOUS
  Administered 2023-01-11: 50 ug via INTRAVENOUS

## 2023-01-11 MED ORDER — KETOROLAC TROMETHAMINE 30 MG/ML IJ SOLN
INTRAMUSCULAR | Status: AC
  Administered 2023-01-11: 30 mg via INTRAVENOUS
  Filled 2023-01-11: qty 1

## 2023-01-11 MED ORDER — 0.9 % SODIUM CHLORIDE (POUR BTL) OPTIME
TOPICAL | Status: DC | PRN
  Administered 2023-01-11: 1000 mL

## 2023-01-11 MED ORDER — PROPOFOL 10 MG/ML IV BOLUS
INTRAVENOUS | Status: DC | PRN
  Administered 2023-01-11: 170 mg via INTRAVENOUS

## 2023-01-11 MED ORDER — TRAMADOL HCL 50 MG PO TABS: 50.0000 mg | ORAL_TABLET | Freq: Four times a day (QID) | ORAL | 0 refills | Status: DC | PRN

## 2023-01-11 MED ORDER — CEFAZOLIN SODIUM-DEXTROSE 2-4 GM/100ML-% IV SOLN
2.0000 g | INTRAVENOUS | Status: AC
  Administered 2023-01-11: 2 g via INTRAVENOUS
  Filled 2023-01-11: qty 100

## 2023-01-11 MED ORDER — AMISULPRIDE (ANTIEMETIC) 5 MG/2ML IV SOLN: 10.0000 mg | Freq: Once | INTRAVENOUS | Status: DC | PRN

## 2023-01-11 MED ORDER — CHLORHEXIDINE GLUCONATE 0.12 % MT SOLN: 15.0000 mL | Freq: Once | OROMUCOSAL | Status: AC

## 2023-01-11 MED ORDER — BUPIVACAINE HCL (PF) 0.25 % IJ SOLN
INTRAMUSCULAR | Status: AC
  Filled 2023-01-11: qty 30

## 2023-01-11 MED ORDER — FENTANYL CITRATE (PF) 100 MCG/2ML IJ SOLN
INTRAMUSCULAR | Status: DC | PRN
  Administered 2023-01-11: 100 ug via INTRAVENOUS
  Administered 2023-01-11: 50 ug via INTRAVENOUS

## 2023-01-11 SURGICAL SUPPLY — 35 items
ADH SKN CLS APL DERMABOND .7 (GAUZE/BANDAGES/DRESSINGS) ×1
APL PRP STRL LF DISP 70% ISPRP (MISCELLANEOUS) ×1
ATTRACTOMAT 16X20 MAGNETIC DRP (DRAPES) ×2 IMPLANT
BAG COUNTER SPONGE SURGICOUNT (BAG) ×2 IMPLANT
BAG SPNG CNTER NS LX DISP (BAG) ×1
BLADE SURG 15 STRL LF DISP TIS (BLADE) ×2 IMPLANT
BLADE SURG 15 STRL SS (BLADE) ×1
CHLORAPREP W/TINT 26 (MISCELLANEOUS) ×2 IMPLANT
CLIP TI MEDIUM 6 (CLIP) ×4 IMPLANT
CLIP TI WIDE RED SMALL 6 (CLIP) ×4 IMPLANT
COVER SURGICAL LIGHT HANDLE (MISCELLANEOUS) ×2 IMPLANT
DERMABOND ADVANCED .7 DNX12 (GAUZE/BANDAGES/DRESSINGS) ×2 IMPLANT
DRAPE LAPAROTOMY T 98X78 PEDS (DRAPES) ×2 IMPLANT
DRAPE UTILITY XL STRL (DRAPES) ×2 IMPLANT
ELECT REM PT RETURN 15FT ADLT (MISCELLANEOUS) ×2 IMPLANT
GAUZE 4X4 16PLY ~~LOC~~+RFID DBL (SPONGE) ×2 IMPLANT
GLOVE SURG ORTHO 8.0 STRL STRW (GLOVE) ×2 IMPLANT
GOWN STRL REUS W/ TWL XL LVL3 (GOWN DISPOSABLE) ×6 IMPLANT
GOWN STRL REUS W/TWL XL LVL3 (GOWN DISPOSABLE) ×3
HEMOSTAT SURGICEL 2X4 FIBR (HEMOSTASIS) ×2 IMPLANT
ILLUMINATOR WAVEGUIDE N/F (MISCELLANEOUS) IMPLANT
KIT BASIN OR (CUSTOM PROCEDURE TRAY) ×2 IMPLANT
KIT TURNOVER KIT A (KITS) IMPLANT
NDL HYPO 25X1 1.5 SAFETY (NEEDLE) ×2 IMPLANT
NEEDLE HYPO 25X1 1.5 SAFETY (NEEDLE) ×1 IMPLANT
PACK BASIC VI WITH GOWN DISP (CUSTOM PROCEDURE TRAY) ×2 IMPLANT
PENCIL SMOKE EVACUATOR (MISCELLANEOUS) ×2 IMPLANT
SHEARS HARMONIC 9CM CVD (BLADE) IMPLANT
SUT MNCRL AB 4-0 PS2 18 (SUTURE) ×2 IMPLANT
SUT VIC AB 3-0 SH 18 (SUTURE) ×2 IMPLANT
SYR BULB IRRIG 60ML STRL (SYRINGE) ×2 IMPLANT
SYR CONTROL 10ML LL (SYRINGE) ×2 IMPLANT
TOWEL OR 17X26 10 PK STRL BLUE (TOWEL DISPOSABLE) ×2 IMPLANT
TOWEL OR NON WOVEN STRL DISP B (DISPOSABLE) ×2 IMPLANT
TUBING CONNECTING 10 (TUBING) ×2 IMPLANT

## 2023-01-11 NOTE — Transfer of Care (Signed)
Immediate Anesthesia Transfer of Care Note  Patient: Ana Freeman  Procedure(s) Performed: LEFT INFERIOR PARATHYROIDECTOMY (Left)  Patient Location: PACU  Anesthesia Type:General  Level of Consciousness: awake, alert , and oriented  Airway & Oxygen Therapy: Patient Spontanous Breathing and Patient connected to face mask oxygen  Post-op Assessment: Report given to RN and Post -op Vital signs reviewed and stable  Post vital signs: Reviewed and stable  Last Vitals:  Vitals Value Taken Time  BP 138/99 01/11/23 0854  Temp 36.4 C 01/11/23 0854  Pulse 69 01/11/23 0859  Resp 14 01/11/23 0859  SpO2 100 % 01/11/23 0859  Vitals shown include unvalidated device data.  Last Pain:  Vitals:   01/11/23 0636  TempSrc:   PainSc: 0-No pain      Patients Stated Pain Goal: 3 (29/47/65 4650)  Complications: No notable events documented.

## 2023-01-11 NOTE — Interval H&P Note (Signed)
History and Physical Interval Note:  01/11/2023 7:05 AM  Ana Freeman  has presented today for surgery, with the diagnosis of PRIMARY HYPERPARATHYROIDISM.  The various methods of treatment have been discussed with the patient and family. After consideration of risks, benefits and other options for treatment, the patient has consented to    Procedure(s): LEFT INFERIOR PARATHYROIDECTOMY (Left) as a surgical intervention.    The patient's history has been reviewed, patient examined, no change in status, stable for surgery.  I have reviewed the patient's chart and labs.  Questions were answered to the patient's satisfaction.    Armandina Gemma, [facility] [place] practice Office: [place]

## 2023-01-11 NOTE — Anesthesia Preprocedure Evaluation (Addendum)
Anesthesia Evaluation  Patient identified by MRN, date of birth, ID band Patient awake    Reviewed: Allergy & Precautions, NPO status , Patient's Chart, lab work & pertinent test results  Airway Mallampati: II  TM Distance: >3 FB Neck ROM: Full    Dental no notable dental hx.    Pulmonary neg pulmonary ROS   Pulmonary exam normal        Cardiovascular negative cardio ROS Normal cardiovascular exam     Neuro/Psych  PSYCHIATRIC DISORDERS Anxiety     negative neurological ROS     GI/Hepatic Neg liver ROS,GERD  Medicated and Controlled,,  Endo/Other  negative endocrine ROS    Renal/GU negative Renal ROS     Musculoskeletal negative musculoskeletal ROS (+)    Abdominal   Peds  Hematology negative hematology ROS (+)   Anesthesia Other Findings PRIMARY HYPERPARATHYROIDISM  Reproductive/Obstetrics Hcg negative                             Anesthesia Physical Anesthesia Plan  ASA: 2  Anesthesia Plan: General   Post-op Pain Management:    Induction: Intravenous  PONV Risk Score and Plan: 3 and Ondansetron, Dexamethasone, Midazolam, Treatment may vary due to age or medical condition and Propofol infusion  Airway Management Planned: Oral ETT  Additional Equipment:   Intra-op Plan:   Post-operative Plan: Extubation in OR  Informed Consent: I have reviewed the patients History and Physical, chart, labs and discussed the procedure including the risks, benefits and alternatives for the proposed anesthesia with the patient or authorized representative who has indicated his/her understanding and acceptance.     Dental advisory given  Plan Discussed with: CRNA  Anesthesia Plan Comments:        Anesthesia Quick Evaluation

## 2023-01-11 NOTE — Anesthesia Procedure Notes (Signed)
Procedure Name: Intubation Date/Time: 01/11/2023 7:32 AM  Performed by: Aline Brochure, CRNAPre-anesthesia Checklist: Patient identified, Patient being monitored, Timeout performed, Emergency Drugs available and Suction available Patient Re-evaluated:Patient Re-evaluated prior to induction Oxygen Delivery Method: Circle system utilized Preoxygenation: Pre-oxygenation with 100% oxygen Induction Type: IV induction Ventilation: Mask ventilation without difficulty Laryngoscope Size: Mac and 3 Grade View: Grade I Tube type: Oral Tube size: 7.0 mm Number of attempts: 1 Airway Equipment and Method: Stylet Placement Confirmation: ETT inserted through vocal cords under direct vision, positive ETCO2 and breath sounds checked- equal and bilateral Secured at: 22 cm Tube secured with: Tape Dental Injury: Teeth and Oropharynx as per pre-operative assessment

## 2023-01-11 NOTE — Op Note (Signed)
OPERATIVE REPORT - PARATHYROIDECTOMY  Preoperative diagnosis: Primary hyperparathyroidism  Postop diagnosis: Same  Procedure: Left inferior minimally invasive parathyroidectomy  Surgeon:  Armandina Gemma, MD  Anesthesia: General endotracheal  Estimated blood loss: Minimal  Preparation: ChloraPrep  Indications: Patient presents on referral from her endocrinologist, Dr. Vivia Ewing, for surgical evaluation and management of primary hyperparathyroidism. Patient first noted a tingling sensation. She was evaluated by her primary care physician and noted to have elevated serum calcium levels. Patient underwent nuclear medicine parathyroid scan on January 01, 2022. This showed abnormal sestamibi retention at the left inferior position consistent with a parathyroid adenoma. Patient has had a previous CT scan which also demonstrated a mass at that point corresponding to the signal seen on sestamibi scan consistent with parathyroid adenoma. Patient subsequently underwent an ultrasound examination on April 02, 2022. This showed multiple thyroid nodules. Also noted was a 2 cm nodule deep to the left thyroid lobe with a polar vessel felt to be consistent with parathyroid adenoma. Of the multiple thyroid nodules, 2 of these underwent fine-needle aspiration biopsy with benign cytopathology. Recent laboratories show persistent hypercalcemia with calcium levels ranging from 10.9 to as high as 11.8. Recent intact PTH level was elevated at 149. 24-hour urine collection for calcium was 225. Patient denies any complications. She has had some bone and joint pain. She describes some memory issues and "brain fog". There is no family history of parathyroid disease. Patient has had no prior head or neck surgery. 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   Procedure: The patient was prepared in the pre-operative holding area. The patient was brought to the operating room and placed in a supine  position on the operating room table. Following administration of general anesthesia, the patient was positioned and then prepped and draped in the usual strict aseptic fashion. After ascertaining that an adequate level of anesthesia been achieved, a neck incision was made with a #15 blade. Dissection was carried through subcutaneous tissues and platysma. Hemostasis was obtained with the electrocautery. Skin flaps were developed circumferentially and a Weitlander retractor was placed for exposure.  Strap muscles were incised in the midline. Strap muscles were reflected laterally exposing the lower left thyroid lobe. With gentle blunt dissection the thyroid lobe was mobilized.  Dissection was carried posteriorly and an enlarged parathyroid gland was identified. It was gently mobilized. Vascular structures were divided between small ligaclips. Care was taken to avoid the recurrent laryngeal nerve. The parathyroid gland was completely excised. It was submitted to pathology where frozen section confirmed hypercellular parathyroid tissue consistent with adenoma.  Neck was irrigated with warm saline and good hemostasis was noted. Fibrillar was placed in the operative field. Strap muscles were approximated in the midline with interrupted 3-0 Vicryl sutures. Platysma was closed with interrupted 3-0 Vicryl sutures. Marcaine was infiltrated circumferentially. Skin was closed with a running 4-0 Monocryl subcuticular suture. Wound was washed and dried and Dermabond was applied. Patient was awakened from anesthesia and brought to the recovery room. The patient tolerated the procedure well.   Armandina Gemma, [place] [facility] Office: [phone]

## 2023-01-11 NOTE — Discharge Instructions (Addendum)
[facility] - Dr. Todd Gerkin  THYROID & PARATHYROID SURGERY:  POST-OP INSTRUCTIONS  Always review the instruction sheet provided by the hospital nurse at discharge.  A prescription for pain medication may be sent to your pharmacy at the time of discharge.  Take your pain medication as prescribed.  If narcotic pain medicine is not needed, then you may take acetaminophen (Tylenol) or ibuprofen (Advil) as needed for pain or soreness.  Take your normal home medications as prescribed unless otherwise directed.  If you need a refill on your pain medication, please contact the office during regular business hours.  Prescriptions will not be processed by the office after 5:00PM or on weekends.  Start with a light diet upon arrival home, such as soup and crackers or toast.  Be sure to drink plenty of fluids.  Resume your normal diet the day after surgery.  Most patients will experience some swelling and bruising on the chest and neck area.  Ice packs will help for the first 48 hours after arriving home.  Swelling and bruising will take several days to resolve.   It is common to experience some constipation after surgery.  Increasing fluid intake and taking a stool softener (Colace) will usually help to prevent this problem.  A mild laxative (Milk of Magnesia or Miralax) should be taken according to package directions if there has been no bowel movement after 48 hours.  Dermabond glue covers your incision. This seals the wound and you may shower at any time. The Dermabond will remain in place for about a week.  You may gradually remove the glue when it loosens around the edges.  If you need to loosen the Dermabond for removal, apply a layer of Vaseline to the wound for 15 minutes and then remove with a Kleenex. Your sutures are under the skin and will not show - they will dissolve on their own.  You may resume light daily activities beginning the day after discharge (such as self-care,  walking, climbing stairs), gradually increasing activities as tolerated. You may have sexual intercourse when it is comfortable. Refrain from any heavy lifting or straining until approved by your doctor. You may drive when you no longer are taking prescription pain medication, you can comfortably wear a seatbelt, and you can safely maneuver your car and apply the brakes.  You will see your doctor in the office for a follow-up appointment approximately three weeks after your surgery.  Make sure that you call for this appointment within a day or two after you arrive home to insure a convenient appointment time. Please have any requested laboratory tests performed a few days prior to your office visit so that the results will be available at your follow up appointment.  WHEN TO CALL THE [facility] OFFICE: -- Fever greater than 101.5 -- Inability to urinate -- Nausea and/or vomiting - persistent -- Extreme swelling or bruising -- Continued bleeding from incision -- Increased pain, redness, or drainage from the incision -- Difficulty swallowing or breathing -- Muscle cramping or spasms -- Numbness or tingling in hands or around lips  The clinic staff is available to answer your questions during regular business hours.  Please don't hesitate to call and ask to speak to one of the nurses if you have concerns.  [facility] OFFICE: [phone] (24 hours)  Please sign up for MyChart accounts. This will allow you to communicate directly with my nurse or myself without having to call the office. It will also allow you   to view your test results. You will need to enroll in MyChart for my office ([facility]) and for the hospital ().  Todd Gerkin, MD [facility] A [facility] practice 

## 2023-01-12 ENCOUNTER — Encounter (HOSPITAL_COMMUNITY): Payer: Self-pay | Admitting: Surgery

## 2023-01-12 LAB — SURGICAL PATHOLOGY

## 2023-01-12 NOTE — Anesthesia Postprocedure Evaluation (Signed)
Anesthesia Post Note  Patient: Ana Freeman  Procedure(s) Performed: LEFT INFERIOR PARATHYROIDECTOMY (Left)     Patient location during evaluation: PACU Anesthesia Type: General Level of consciousness: awake Pain management: pain level controlled Vital Signs Assessment: post-procedure vital signs reviewed and stable Respiratory status: spontaneous breathing, nonlabored ventilation and respiratory function stable Cardiovascular status: blood pressure returned to baseline and stable Postop Assessment: no apparent nausea or vomiting Anesthetic complications: no   No notable events documented.  Last Vitals:  Vitals:   01/11/23 0945 01/11/23 0956  BP: 137/86 135/86  Pulse: 62 60  Resp: 19   Temp:    SpO2: 99% 99%    Last Pain:  Vitals:   01/11/23 0956  TempSrc:   PainSc: 0-No pain                 [name]

## 2023-01-27 ENCOUNTER — Telehealth: Payer: BC Managed Care – PPO | Admitting: Family Medicine

## 2023-01-27 DIAGNOSIS — J019 Acute sinusitis, unspecified: Secondary | ICD-10-CM

## 2023-01-27 DIAGNOSIS — B9689 Other specified bacterial agents as the cause of diseases classified elsewhere: Secondary | ICD-10-CM

## 2023-01-27 MED ORDER — AMOXICILLIN-POT CLAVULANATE 875-125 MG PO TABS: 1.0000 | ORAL_TABLET | Freq: Two times a day (BID) | ORAL | 0 refills | Status: AC

## 2023-01-27 NOTE — Patient Instructions (Addendum)
[name], thank you for joining Perlie Mayo, NP for today's virtual visit.  While this provider is not your primary care provider (PCP), if your PCP is located in our provider database this encounter information will be shared with them immediately following your visit.   Learned account gives you access to today's visit and all your visits, tests, and labs performed at [facility] " click here if you don't have a [facility] account or go to [email or web address]  Consent: (Patient) Ana Freeman provided verbal consent for this virtual visit at the beginning of the encounter.  Current Medications:  Current Outpatient Medications:    amoxicillin-clavulanate (AUGMENTIN) 875-125 MG tablet, Take 1 tablet by mouth 2 (two) times daily for 7 days., Disp: 14 tablet, Rfl: 0   Cholecalciferol (VITAMIN D-3 PO), Take 1 tablet by mouth in the morning., Disp: , Rfl:    clobetasol ointment (TEMOVATE) [id number] %, Apply 1 Application topically 3 (three) times a week. Applied to scalp, Disp: , Rfl:    estradiol (ESTRACE) 0.5 MG tablet, Take 1 mg by mouth in the morning., Disp: , Rfl:    finasteride (PROSCAR) 5 MG tablet, Take 2.5 mg by mouth in the morning., Disp: , Rfl:    Multiple Vitamins-Minerals (ADULT ONE DAILY GUMMIES PO), Take 2 tablets by mouth in the morning., Disp: , Rfl:    pantoprazole (PROTONIX) 20 MG tablet, Take 20 mg by mouth daily before breakfast., Disp: , Rfl:    traMADol (ULTRAM) 50 MG tablet, Take 1-2 tablets (50-100 mg total) by mouth every 6 (six) hours as needed for moderate pain., Disp: 15 tablet, Rfl: 0   valACYclovir (VALTREX) 500 MG tablet, Take 500 mg by mouth daily as needed (BREAKOUTS)., Disp: , Rfl:    Medications ordered in this encounter:  Meds ordered this encounter  Medications   amoxicillin-clavulanate (AUGMENTIN) 875-125 MG tablet    Sig: Take 1 tablet by mouth 2 (two) times daily for 7 days.    Dispense:  14 tablet     Refill:  0    Order Specific Question:   Supervising Provider    Answer:   Chase Picket [id number]     *If you need refills on other medications prior to your next appointment, please contact your pharmacy*  Follow-Up: Call back or seek an in-person evaluation if the symptoms worsen or if the condition fails to improve as anticipated.  Ana Freeman  Other Instructions  Sinus Infection, Adult A sinus infection is soreness and swelling (inflammation) of your sinuses. Sinuses are hollow spaces in the bones around your face. They are located: Around your eyes. In the middle of your forehead. Behind your nose. In your cheekbones. Your sinuses and nasal passages are lined with a fluid called mucus. Mucus drains out of your sinuses. Swelling can trap mucus in your sinuses. This lets germs (bacteria, virus, or fungus) grow, which leads to infection. Most of the time, this condition is caused by a virus. What are the causes? Allergies. Asthma. Germs. Things that block your nose or sinuses. Growths in the nose (nasal polyps). Chemicals or irritants in the air. A fungus. This is rare. What increases the risk? Having a weak body defense system (immune system). Doing a lot of swimming or diving. Using nasal sprays too much. Smoking. What are the signs or symptoms? The main symptoms of this condition are pain and a feeling of pressure around the sinuses. Other symptoms  include: Stuffy nose (congestion). This may make it hard to breathe through your nose. Runny nose (drainage). Soreness, swelling, and warmth in the sinuses. A cough that may get worse at night. Being unable to smell and taste. Mucus that collects in the throat or the back of the nose (postnasal drip). This may cause a sore throat or bad breath. Being very tired (fatigued). A fever. How is this diagnosed? Your symptoms. Your medical history. A physical exam. Tests to find out if your  condition is short-term (acute) or long-term (chronic). Your doctor may: Check your nose for growths (polyps). Check your sinuses using a tool that has a light on one end (endoscope). Check for allergies or germs. Do imaging tests, such as an MRI or CT scan. How is this treated? Treatment for this condition depends on the cause and whether it is short-term or long-term. If caused by a virus, your symptoms should go away on their own within 10 days. You may be given medicines to relieve symptoms. They include: Medicines that shrink swollen tissue in the nose. A spray that treats swelling of the nostrils. Rinses that help get rid of thick mucus in your nose (nasal saline washes). Medicines that treat allergies (antihistamines). Over-the-counter pain relievers. If caused by bacteria, your doctor may wait to see if you will get better without treatment. You may be given antibiotic medicine if you have: A very bad infection. A weak body defense system. If caused by growths in the nose, surgery may be needed. Follow these instructions at home: Medicines Take, use, or apply over-the-counter and prescription medicines only as told by your doctor. These may include nasal sprays. If you were prescribed an antibiotic medicine, take it as told by your doctor. Do not stop taking it even if you start to feel better. Hydrate and humidify  Drink enough water to keep your pee (urine) pale yellow. Use a cool mist humidifier to keep the humidity level in your home above 50%. Breathe in steam for 10-15 minutes, 3-4 times a day, or as told by your doctor. You can do this in the bathroom while a hot shower is running. Try not to spend time in cool or dry air. Rest Rest as much as you can. Sleep with your head raised (elevated). Make sure you get enough sleep each night. General instructions  Put a warm, moist washcloth on your face 3-4 times a day, or as often as told by your doctor. Use nasal saline  washes as often as told by your doctor. Wash your hands often with soap and water. If you cannot use soap and water, use hand sanitizer. Do not smoke. Avoid being around people who are smoking (secondhand smoke). Keep all follow-up visits. Contact a doctor if: You have a fever. Your symptoms get worse. Your symptoms do not get better within 10 days. Get help right away if: You have a very bad headache. You cannot stop vomiting. You have very bad pain or swelling around your face or eyes. You have trouble seeing. You feel confused. Your neck is stiff. You have trouble breathing. These symptoms may be an emergency. Get help right away. Call 911. Do not wait to see if the symptoms will go away. Do not drive yourself to the hospital. Summary A sinus infection is swelling of your sinuses. Sinuses are hollow spaces in the bones around your face. This condition is caused by tissues in your nose that become inflamed or swollen. This traps germs. These can  lead to infection. If you were prescribed an antibiotic medicine, take it as told by your doctor. Do not stop taking it even if you start to feel better. Keep all follow-up visits. This information is not intended to replace advice given to you by your health care provider. Make sure you discuss any questions you have with your health care provider. Document Revised: 11/03/2021 Document Reviewed: 11/03/2021 Elsevier Patient Education  [place] [facility].    If you have been instructed to have an in-person evaluation today at a local Urgent Care facility, please use the link below. It will take you to a list of all of our available [facility], including address, phone number and hours of operation. Please do not delay care.  [facility]  If you or a family member do not have a primary care provider, use the link below to schedule a visit and establish care. When you choose a [facility] primary care physician or  advanced practice provider, you gain a long-term partner in health. Find a Primary Care Provider  Learn more about [facility]'s in-office and virtual care options: [facility]

## 2023-01-27 NOTE — Progress Notes (Signed)
Virtual Visit Consent   [name], you are scheduled for a virtual visit with a [facility] provider today. Just as with appointments in the office, your consent must be obtained to participate. Your consent will be active for this visit and any virtual visit you may have with one of our providers in the next 365 days. If you have a MyChart account, a copy of this consent can be sent to you electronically.  As this is a virtual visit, video technology does not allow for your provider to perform a traditional examination. This may limit your provider's ability to fully assess your condition. If your provider identifies any concerns that need to be evaluated in person or the need to arrange testing (such as labs, EKG, etc.), we will make arrangements to do so. Although advances in technology are sophisticated, we cannot ensure that it will always work on either your end or our end. If the connection with a video visit is poor, the visit may have to be switched to a telephone visit. With either a video or telephone visit, we are not always able to ensure that we have a secure connection.  By engaging in this virtual visit, you consent to the provision of healthcare and authorize for your insurance to be billed (if applicable) for the services provided during this visit. Depending on your insurance coverage, you may receive a charge related to this service.  I need to obtain your verbal consent now. Are you willing to proceed with your visit today? Kamarah L Bonifay has provided verbal consent on 01/27/2023 for a virtual visit (video or telephone). Perlie Mayo, NP  Date: 01/27/2023 8:15 AM  Virtual Visit via Video Note   I, Perlie Mayo, connected with  Ana Freeman  ([id number], 10-Jul-1971) on 01/27/23 at  8:15 AM EST by a video-enabled telemedicine application and verified that I am speaking with the correct person using two identifiers.  Location: Patient: Virtual Visit Location  Patient: Home Provider: Virtual Visit Location Provider: Home Office   I discussed the limitations of evaluation and management by telemedicine and the availability of in person appointments. The patient expressed understanding and agreed to proceed.    History of Present Illness: Ana Freeman is a 52 y.o. who identifies as a female who was assigned female at birth, and is being seen today for sinus like symptoms- onset was 5 days ago- and it has worsened in the last 2 days. Headache, stuffy nose, facial pressure, nasal congestion,  Mild sore throat, ears muffled.  Denies fevers, chills, chest pain or shortness of breath. Husband was sick recently with similar symptoms. Covid negative.   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   Hyperkalemia 02/22/2022   Acute pain of right knee 10/12/2021   Right hand pain 10/12/2021   Elevated glucose 10/12/2021   COVID-19 virus infection 05/08/2021   Herpes 10/28/2020   Alopecia 10/28/2020   Left arm numbness 10/28/2020   Left ankle swelling 10/28/2020   Acute stress disorder    Hyperlipidemia 07/19/2018   Chest pain of uncertain etiology [id number]   GERD (gastroesophageal reflux disease) 10/28/2015   Nonspecific abnormal electrocardiogram (ECG) (EKG) 02/11/2015   Anemia, iron deficiency 12/20/2014    Allergies:  Allergies  Allergen Reactions   Bupropion Palpitations   Chlorhexidine Gluconate Itching  Medications:  Current Outpatient Medications:    Cholecalciferol (VITAMIN D-3 PO), Take 1 tablet by mouth in the morning., Disp: , Rfl:    clobetasol ointment (TEMOVATE) AB-123456789 %, Apply 1 Application topically 3 (three) times a week. Applied to scalp, Disp: , Rfl:    estradiol (ESTRACE) 0.5 MG tablet, Take 1 mg by mouth in the morning., Disp: , Rfl:     finasteride (PROSCAR) 5 MG tablet, Take 2.5 mg by mouth in the morning., Disp: , Rfl:    Multiple Vitamins-Minerals (ADULT ONE DAILY GUMMIES PO), Take 2 tablets by mouth in the morning., Disp: , Rfl:    pantoprazole (PROTONIX) 20 MG tablet, Take 20 mg by mouth daily before breakfast., Disp: , Rfl:    traMADol (ULTRAM) 50 MG tablet, Take 1-2 tablets (50-100 mg total) by mouth every 6 (six) hours as needed for moderate pain., Disp: 15 tablet, Rfl: 0   valACYclovir (VALTREX) 500 MG tablet, Take 500 mg by mouth daily as needed (BREAKOUTS)., Disp: , Rfl:   Observations/Objective: Patient is well-developed, well-nourished in no acute distress.  Resting comfortably  at home.  Head is normocephalic, atraumatic.  No labored breathing.  Speech is clear and coherent with logical content.  Patient is alert and oriented at baseline.    Assessment and Plan:  1. Acute bacterial sinusitis  - amoxicillin-clavulanate (AUGMENTIN) 875-125 MG tablet; Take 1 tablet by mouth 2 (two) times daily for 7 days.  Dispense: 14 tablet; Refill: 0  -Take meds as prescribed -Rest -Use a cool mist humidifier especially during the winter months when heat dries out the air. - Use saline nose sprays frequently to help soothe nasal passages and promote drainage. -Saline irrigations of the nose can be very helpful if done frequently.             * 4X daily for 1 week*             * Use of a nettie pot can be helpful with this.  *Follow directions with this* *Boiled or distilled water only -stay hydrated by drinking plenty of fluids - Keep thermostat turn down low to prevent drying out sinuses - For any cough or congestion- robitussin DM or Delsym as needed - For fever or aches or pains- take tylenol or ibuprofen as directed on bottle             * for fevers greater than 101 orally you may alternate ibuprofen and tylenol every 3 hours.  If you do not improve you will need a follow up visit in person.                  Reviewed side effects, risks and benefits of medication.    Patient acknowledged agreement and understanding of the plan.   Past Medical, Surgical, Social History, Allergies, and Medications have been Reviewed.    Follow Up Instructions: I discussed the assessment and treatment plan with the patient. The patient was provided an opportunity to ask questions and all were answered. The patient agreed with the plan and demonstrated an understanding of the instructions.  A copy of instructions were sent to the patient via MyChart unless otherwise noted below.    The patient was advised to call back or seek an in-person evaluation if the symptoms worsen or if the condition fails to improve as anticipated.  Time:  I spent 10 minutes with the patient via telehealth technology discussing the above problems/concerns.    Perlie Mayo, NP

## 2023-01-28 ENCOUNTER — Telehealth: Payer: BC Managed Care – PPO

## 2023-02-04 ENCOUNTER — Ambulatory Visit: Payer: BC Managed Care – PPO | Admitting: Internal Medicine

## 2023-02-18 ENCOUNTER — Ambulatory Visit: Payer: BC Managed Care – PPO | Admitting: Family Medicine

## 2023-02-18 ENCOUNTER — Other Ambulatory Visit: Payer: Self-pay | Admitting: Family Medicine

## 2023-02-18 ENCOUNTER — Encounter: Payer: Self-pay | Admitting: Family Medicine

## 2023-02-18 VITALS — BP 100/74 | HR 75 | Temp 97.3°F | Ht 64.0 in | Wt 194.0 lb

## 2023-02-18 DIAGNOSIS — E7800 Pure hypercholesterolemia, unspecified: Secondary | ICD-10-CM

## 2023-02-18 DIAGNOSIS — Z862 Personal history of diseases of the blood and blood-forming organs and certain disorders involving the immune mechanism: Secondary | ICD-10-CM | POA: Diagnosis not present

## 2023-02-18 DIAGNOSIS — Z131 Encounter for screening for diabetes mellitus: Secondary | ICD-10-CM | POA: Diagnosis not present

## 2023-02-18 DIAGNOSIS — R200 Anesthesia of skin: Secondary | ICD-10-CM

## 2023-02-18 DIAGNOSIS — R41840 Attention and concentration deficit: Secondary | ICD-10-CM | POA: Diagnosis not present

## 2023-02-18 DIAGNOSIS — R202 Paresthesia of skin: Secondary | ICD-10-CM

## 2023-02-18 DIAGNOSIS — R413 Other amnesia: Secondary | ICD-10-CM | POA: Diagnosis not present

## 2023-02-18 LAB — COMPREHENSIVE METABOLIC PANEL
ALT: 25 U/L (ref 0–35)
AST: 19 U/L (ref 0–37)
Albumin: 4 g/dL (ref 3.5–5.2)
Alkaline Phosphatase: 100 U/L (ref 39–117)
BUN: 12 mg/dL (ref 6–23)
CO2: 28 mEq/L (ref 19–32)
Calcium: 9.9 mg/dL (ref 8.4–10.5)
Chloride: 105 mEq/L (ref 96–112)
Creatinine, Ser: 0.83 mg/dL (ref 0.40–1.20)
GFR: 82.3 mL/min (ref >60.00)
Glucose, Bld: 81 mg/dL (ref 70–99)
Potassium: 4.8 mEq/L (ref 3.5–5.1)
Sodium: 138 mEq/L (ref 135–145)
Total Bilirubin: 0.5 mg/dL (ref 0.2–1.2)
Total Protein: 7 g/dL (ref 6.0–8.3)

## 2023-02-18 LAB — LIPID PANEL
Cholesterol: 256 mg/dL — ABNORMAL HIGH (ref 0–200)
HDL: 59.9 mg/dL (ref >39.00)
LDL Cholesterol: 184 mg/dL — ABNORMAL HIGH (ref 0–99)
NonHDL: 196.31
Total CHOL/HDL Ratio: 4
Triglycerides: 63 mg/dL (ref 0.0–149.0)
VLDL: 12.6 mg/dL (ref 0.0–40.0)

## 2023-02-18 LAB — POCT GLYCOSYLATED HEMOGLOBIN (HGB A1C): Hemoglobin A1C: 5.6 % (ref 4.0–5.6)

## 2023-02-18 LAB — CBC WITH DIFFERENTIAL/PLATELET
Basophils Absolute: 0 10*3/uL (ref 0.0–0.1)
Basophils Relative: 0.5 % (ref 0.0–3.0)
Eosinophils Absolute: 0.1 10*3/uL (ref 0.0–0.7)
Eosinophils Relative: 2.7 % (ref 0.0–5.0)
HCT: 41.9 % (ref 36.0–46.0)
Hemoglobin: 13.7 g/dL (ref 12.0–15.0)
Lymphocytes Relative: 31.2 % (ref 12.0–46.0)
Lymphs Abs: 1.2 10*3/uL (ref 0.7–4.0)
MCHC: 32.6 g/dL (ref 30.0–36.0)
MCV: 87.8 fl (ref 78.0–100.0)
Monocytes Absolute: 0.3 10*3/uL (ref 0.1–1.0)
Monocytes Relative: 8.7 % (ref 3.0–12.0)
Neutro Abs: 2.2 10*3/uL (ref 1.4–7.7)
Neutrophils Relative %: 56.9 % (ref 43.0–77.0)
Platelets: 232 10*3/uL (ref 150.0–400.0)
RBC: 4.77 Mil/uL (ref 3.87–5.11)
RDW: 13.4 % (ref 11.5–15.5)
WBC: 3.9 10*3/uL — ABNORMAL LOW (ref 4.0–10.5)

## 2023-02-18 LAB — TSH: TSH: 1.14 u[IU]/mL (ref 0.35–5.50)

## 2023-02-18 LAB — VITAMIN B12: Vitamin B-12: 307 pg/mL (ref 211–911)

## 2023-02-18 LAB — T4, FREE: Free T4: 0.78 ng/dL (ref 0.60–1.60)

## 2023-02-18 LAB — VITAMIN D 25 HYDROXY (VIT D DEFICIENCY, FRACTURES): VITD: 28.19 ng/mL — ABNORMAL LOW (ref 30.00–100.00)

## 2023-02-18 LAB — T3, FREE: T3, Free: 3.3 pg/mL (ref 2.3–4.2)

## 2023-02-18 MED ORDER — VITAMIN D3 1.25 MG (50000 UT) PO CAPS: 50000.0000 [IU] | ORAL_CAPSULE | ORAL | 0 refills | Status: DC

## 2023-02-18 NOTE — Progress Notes (Signed)
Patient ID: Ana Freeman, female    DOB: 08/23/1971, 52 y.o.   MRN: [id number]  This visit was conducted in person.  BP 100/74   Pulse 75   Temp (!) 97.3 F (36.3 C) (Temporal)   Ht '5\' 4"'$  (1.626 m)   Wt 194 lb (88 kg)   LMP 11/05/2020   SpO2 99%   BMI 33.30 kg/m    CC:  Chief Complaint  Patient presents with   Anxiety   Foggy Brain   Tingling    Left Arm/Hand-Patient is concerned about having a stroke-Wants full blood work up    Subjective:   HPI: Ana Freeman is a 52 y.o. female presenting on 02/18/2023 for Anxiety, Foggy Brain, and Tingling (Left Arm/Hand-Patient is concerned about having a stroke-Wants full blood work up)   Previous PCP: Einar Pheasant Has appointment to establish care with [facility] May 16th  Reviewed last office visit note from PCP. She has history of hyperparathyroidism, thyroid nodules, hyperlipidemia. She is followed by Dr. Dorothyann Peng for endocrinology.  She has had visit with Dr. Harlow Asa on January 11, 2023 for hyperparathyroidism and elevated serum calcium levels.  Noted left inferior parathyroid adenoma.  Performed left inferior parathyroidectomy.   She has been having decreased thinking, foggy in her head, decreased memory... ongoing x 9-12 months Has improved only slightly since parathyroidectomy.  No fatigue.  She has been on estradiol since TAH in 2022   Feeling of tightness in left shoulder, tense. Tingling in left hand off and on since 2021.    02/18/2023    9:56 AM  GAD 7 : Generalized Anxiety Score  Nervous, Anxious, on Edge 1  Control/stop worrying 1  Worry too much - different things 1  Trouble relaxing 0  Restless 0  Easily annoyed or irritable 0  Afraid - awful might happen 2  Total GAD 7 Score 5  Anxiety Difficulty Not difficult at all       02/18/2023    9:55 AM 07/05/2022    4:28 PM 10/28/2020    4:43 PM  Depression screen PHQ 2/9  Decreased Interest 0 0 0  Down, Depressed, Hopeless 1 0 0  PHQ - 2 Score 1 0 0   Altered sleeping 0    Tired, decreased energy 1    Change in appetite 0    Feeling bad or failure about yourself  0    Trouble concentrating 2    Moving slowly or fidgety/restless 0    Suicidal thoughts 0    PHQ-9 Score 4    Difficult doing work/chores Not difficult at all       Relevant past medical, surgical, family and social history reviewed and updated as indicated. Interim medical history since our last visit reviewed. Allergies and medications reviewed and updated. Outpatient Medications Prior to Visit  Medication Sig Dispense Refill   Cholecalciferol (VITAMIN D-3 PO) Take 1 tablet by mouth in the morning.     clobetasol ointment (TEMOVATE) [id number] % Apply 1 Application topically 3 (three) times a week. Applied to scalp     estradiol (ESTRACE) 0.5 MG tablet Take 1 mg by mouth in the morning.     finasteride (PROSCAR) 5 MG tablet Take 2.5 mg by mouth in the morning.     Multiple Vitamins-Minerals (ADULT ONE DAILY GUMMIES PO) Take 2 tablets by mouth in the morning.     pantoprazole (PROTONIX) 20 MG tablet Take 20 mg by mouth daily before breakfast.  valACYclovir (VALTREX) 500 MG tablet Take 500 mg by mouth daily as needed (BREAKOUTS).     traMADol (ULTRAM) 50 MG tablet Take 1-2 tablets (50-100 mg total) by mouth every 6 (six) hours as needed for moderate pain. 15 tablet 0   No facility-administered medications prior to visit.     Per HPI unless specifically indicated in ROS section below Review of Systems  Constitutional:  Negative for fatigue and fever.  HENT:  Negative for congestion.   Eyes:  Negative for pain.  Respiratory:  Negative for cough and shortness of breath.   Cardiovascular:  Negative for chest pain, palpitations and leg swelling.  Gastrointestinal:  Negative for abdominal pain.  Genitourinary:  Negative for dysuria and vaginal bleeding.  Musculoskeletal:  Negative for back pain.  Neurological:  Positive for numbness. Negative for dizziness, syncope,  light-headedness and headaches.  Psychiatric/Behavioral:  Negative for dysphoric mood.    Objective:  BP 100/74   Pulse 75   Temp (!) 97.3 F (36.3 C) (Temporal)   Ht '5\' 4"'$  (1.626 m)   Wt 194 lb (88 kg)   LMP 11/05/2020   SpO2 99%   BMI 33.30 kg/m   Wt Readings from Last 3 Encounters:  02/18/23 194 lb (88 kg)  01/11/23 188 lb (85.3 kg)  01/07/23 188 lb (85.3 kg)      Physical Exam Constitutional:      General: She is not in acute distress.    Appearance: Normal appearance. She is well-developed. She is not ill-appearing or toxic-appearing.  HENT:     Head: Normocephalic.     Right Ear: Hearing, tympanic membrane, ear canal and external ear normal. Tympanic membrane is not erythematous, retracted or bulging.     Left Ear: Hearing, tympanic membrane, ear canal and external ear normal. Tympanic membrane is not erythematous, retracted or bulging.     Nose: No mucosal edema or rhinorrhea.     Right Sinus: No maxillary sinus tenderness or frontal sinus tenderness.     Left Sinus: No maxillary sinus tenderness or frontal sinus tenderness.     Mouth/Throat:     Pharynx: Uvula midline.  Eyes:     General: Lids are normal. Lids are everted, no foreign bodies appreciated.     Conjunctiva/sclera: Conjunctivae normal.     Pupils: Pupils are equal, round, and reactive to light.  Neck:     Thyroid: No thyroid mass or thyromegaly.     Vascular: No carotid bruit.     Trachea: Trachea normal.  Cardiovascular:     Rate and Rhythm: Normal rate and regular rhythm.     Pulses: Normal pulses.     Heart sounds: Normal heart sounds, S1 normal and S2 normal. No murmur heard.    No friction rub. No gallop.  Pulmonary:     Effort: Pulmonary effort is normal. No tachypnea or respiratory distress.     Breath sounds: Normal breath sounds. No decreased breath sounds, wheezing, rhonchi or rales.  Abdominal:     General: Bowel sounds are normal.     Palpations: Abdomen is soft.     Tenderness:  There is no abdominal tenderness.  Musculoskeletal:     Left shoulder: Bony tenderness present. Decreased range of motion. Normal strength.     Cervical back: Normal range of motion and neck supple.     Comments: Neg spurling  Neg tinel and phalen  Neg ulnar compression  Skin:    General: Skin is warm and dry.  Findings: No rash.  Neurological:     Mental Status: She is alert.  Psychiatric:        Mood and Affect: Mood is not anxious or depressed.        Speech: Speech normal.        Behavior: Behavior normal. Behavior is cooperative.        Thought Content: Thought content normal.        Judgment: Judgment normal.       Results for orders placed or performed during the hospital encounter of 01/11/23  Surgical pathology  Result Value Ref Range   SURGICAL PATHOLOGY      SURGICAL PATHOLOGY CASE: [id number] PATIENT: Olena Leatherwood Surgical Pathology Report     Clinical History: primary hyperparathyroidism     FINAL MICROSCOPIC DIAGNOSIS:  A. LEFT INFERIOR PARATHYROID, EXCISION: - Hypercellular parathyroid tissue (0.858 g), suggestive of an adenoma      INTRAOPERATIVE DIAGNOSIS:  A.  Left inferior parathyroid: "Hypercellular parathyroid tissue, suggestive of adenoma." Intraoperative diagnosis rendered by Dr. Vic Ripper at 8:24 AM on 01/11/2023.  GROSS DESCRIPTION:  Received fresh is a 1.7 x 1 x 0.6 cm, 0.858 g soft brown nodule.  The specimen is bisected and entirely submitted in 1 cassette.  [facility] 01/11/2023)   Final Diagnosis performed by Jaquita Folds, MD.   Electronically signed 01/12/2023 Technical and / or Professional components performed at [facility], [place] [address], [place], [place] [address].  Immunohistochemistry Technical component (if applicable) was performed at [facility]. [address] [place], [place], [place] [address].   IMMUNOHISTOCHEMISTRY DISCLAIMER (if applicable): Some of these  immunohistochemical stains may have been developed and the performance characteristics determine by [facility]. Some may not have been cleared or approved by the U.S. Food and Drug Administration. The FDA has determined that such clearance or approval is not necessary. This test is used for clinical purposes. It should not be regarded as investigational or for research. This laboratory is certified under the [place] (CLIA-88) as qualified to perform high complexity clinical laboratory testing.  The controls stained appropriately.     Assessment and Plan  Pure hypercholesterolemia Assessment & Plan: Chronic, due for reevaluation of this as well as screening for diabetes.  Discussed ways to decrease risk of cardiovascular disease such as stroke.  Discussed estrogen hormone replacement and decreasing this over time.  Non-smoker  Orders: -     Comprehensive metabolic panel -     Lipid panel  Screening for diabetes mellitus (DM) -     Hemoglobin A1c  Poor concentration  Memory changes Assessment & Plan: Chronic, She feels as though she has had minimal improvement following the parathyroidectomy. Will evaluate with labs to rule out change in thyroid function, hemoglobin, liver, kidney problems and vitamin levels as possible secondary causes of continued concentration, memory symptoms.  Symptoms have been going on long-term with no sudden onset.  Stroke unlikely cause but if lab evaluation unrevealing, consider imaging and further memory testing.   Orders: -     VITAMIN D 25 Hydroxy (Vit-D Deficiency, Fractures) -     TSH -     T4, free -     T3, free  History of anemia -     CBC with Differential/Platelet  Numbness and tingling in left arm Assessment & Plan: Chronic, unable to reproduce tingling today on exam.  Her symptoms sound more likely musculoskeletal related to left shoulder tendinitis/bursitis in addition  to  possible left carpal tunnel. Neurological exam unremarkable and stroke unlikely.  Orders: -     Vitamin B12    No follow-ups on file.   Eliezer Lofts, MD

## 2023-02-18 NOTE — Assessment & Plan Note (Signed)
Chronic, due for reevaluation of this as well as screening for diabetes.  Discussed ways to decrease risk of cardiovascular disease such as stroke.  Discussed estrogen hormone replacement and decreasing this over time.  Non-smoker

## 2023-02-18 NOTE — Assessment & Plan Note (Addendum)
Chronic, She feels as though she has had minimal improvement following the parathyroidectomy. Will evaluate with labs to rule out change in thyroid function, hemoglobin, liver, kidney problems and vitamin levels as possible secondary causes of continued concentration, memory symptoms.  Symptoms have been going on long-term with no sudden onset.  Stroke unlikely cause but if lab evaluation unrevealing, consider imaging and further memory testing.

## 2023-02-18 NOTE — Assessment & Plan Note (Signed)
Chronic, unable to reproduce tingling today on exam.  Her symptoms sound more likely musculoskeletal related to left shoulder tendinitis/bursitis in addition to possible left carpal tunnel. Neurological exam unremarkable and stroke unlikely.

## 2023-02-18 NOTE — Addendum Note (Signed)
Addended by: Pilar Grammes on: 02/18/2023 11:01 AM   Modules accepted: Orders

## 2023-02-21 ENCOUNTER — Encounter: Payer: Self-pay | Admitting: Family Medicine

## 2023-02-22 ENCOUNTER — Telehealth: Payer: BC Managed Care – PPO | Admitting: Family Medicine

## 2023-02-23 ENCOUNTER — Encounter (HOSPITAL_BASED_OUTPATIENT_CLINIC_OR_DEPARTMENT_OTHER): Payer: Self-pay | Admitting: Cardiovascular Disease

## 2023-02-23 NOTE — Telephone Encounter (Signed)
Patient called and wanted to follow up on the medication that was suppose to be sent in for her. Please advise. Thank you!

## 2023-02-24 MED ORDER — ATORVASTATIN CALCIUM 10 MG PO TABS: 10.0000 mg | ORAL_TABLET | Freq: Every day | ORAL | 3 refills | Status: DC

## 2023-03-04 ENCOUNTER — Other Ambulatory Visit: Payer: Self-pay | Admitting: Gastroenterology

## 2023-03-15 LAB — HM MAMMOGRAPHY

## 2023-03-16 ENCOUNTER — Encounter: Payer: Self-pay | Admitting: Family Medicine

## 2023-03-16 NOTE — Telephone Encounter (Signed)
Video appointment scheduled 03/18/2023 at 9:40 am with Dr. Diona Browner.

## 2023-03-16 NOTE — Telephone Encounter (Signed)
Noted. Please have her bring labs with her unless they are in care everywhere.

## 2023-03-18 ENCOUNTER — Telehealth: Payer: BC Managed Care – PPO | Admitting: Family Medicine

## 2023-03-18 ENCOUNTER — Encounter: Payer: Self-pay | Admitting: Family Medicine

## 2023-03-18 ENCOUNTER — Telehealth (INDEPENDENT_AMBULATORY_CARE_PROVIDER_SITE_OTHER): Payer: BC Managed Care – PPO | Admitting: Family Medicine

## 2023-03-18 VITALS — BP 129/85 | HR 70 | Temp 98.6°F | Ht 64.0 in | Wt 193.2 lb

## 2023-03-18 DIAGNOSIS — R748 Abnormal levels of other serum enzymes: Secondary | ICD-10-CM | POA: Insufficient documentation

## 2023-03-18 DIAGNOSIS — E7800 Pure hypercholesterolemia, unspecified: Secondary | ICD-10-CM | POA: Diagnosis not present

## 2023-03-18 DIAGNOSIS — R899 Unspecified abnormal finding in specimens from other organs, systems and tissues: Secondary | ICD-10-CM | POA: Diagnosis not present

## 2023-03-18 DIAGNOSIS — R7303 Prediabetes: Secondary | ICD-10-CM

## 2023-03-18 NOTE — Assessment & Plan Note (Addendum)
Reviewed labs in detail with patient.  Very minimal change in creatinine likely due to decreased water intake and occasional use of nonsteroidal anti-inflammatories. Isolated alkaline phosphatase elevation is unremarkable and does not suggest liver issue.  I do believe she is cleared to use cholesterol medication followed for initiation of menopausal medication discussed.  Later Slight elevation in glucose but given patient was nonfasting this is likely within normal range. Sodium and chloride are very slightly elevated, encouraged patient to decrease salt in her diet.  No significant concerns noted.  Will reevaluate cholesterol, complete metabolic panel and prediabetes as planned in future.

## 2023-03-18 NOTE — Progress Notes (Signed)
VIRTUAL VISIT A virtual visit is felt to be most appropriate for this patient at this time.   I connected with the patient on 03/18/23 at 11:40 AM EDT by virtual telehealth platform and verified that I am speaking with the correct person using two identifiers.   I discussed the limitations, risks, security and privacy concerns of performing an evaluation and management service by  virtual telehealth platform and the availability of in person appointments. I also discussed with the patient that there may be a patient responsible charge related to this service. The patient expressed understanding and agreed to proceed.  Patient location: Home Provider Location: [facility] [place] [facility] Participants: Kerby Nora and [name]   Chief Complaint  Patient presents with   Abnormal Lab    Discuss lab results from GYN's office    History of Present Illness:  52 y.o. female patient of Ermalene Searing, Luberta Robertson, MD presents with me to review labs from gynecology's office.  Previous PCP: Selena Batten Has appointment to establish care with [facility] May 16th  Lab and note from Dr. Osborn Coho she reviewed in detail Note summarized below 1.S/P LAVH/BSO/Cystoscopy 11/19/2020  path benign, fibroids and adenomyosis  pap with reflex neg 06/2021  2.Mammo today  3.Colonoscopy UTD h/o polyp done 2023 q39yrs  4.Screening labs today  5.Menopausal flushing - trial of veozah - rx sent follow CMP and f/u   Has weaned off. 6.Thyromegaly - h/o benign bx 2021. Referred to Dr. Talmage Nap. S/p parathyroidectomy.02/2023   Labs were non-fasting Glucose 113 Creatinine is 1.01, GFR 68 ( she did take aleve prior) Fairly good water intake. Sodium 145, potassium 5, calcium 9.8 Alkaline phosphatase 124, AST 21 ALT 23   Recent labs from our office also reviewed from February 18, 2023.  Hemoglobin A1c 5.6, sodium 138, normal alkaline phosphatase, glucose 81  COVID 19 screen No recent travel or known exposure to COVID19 The  patient denies respiratory symptoms of COVID 19 at this time.  The importance of social distancing was discussed today.   Review of Systems  Constitutional:  Negative for chills and fever.  HENT:  Negative for congestion and ear pain.   Eyes:  Negative for pain and redness.  Respiratory:  Negative for cough and shortness of breath.   Cardiovascular:  Negative for chest pain, palpitations and leg swelling.  Gastrointestinal:  Negative for abdominal pain, blood in stool, constipation, diarrhea, nausea and vomiting.  Genitourinary:  Negative for dysuria.  Musculoskeletal:  Negative for falls and myalgias.  Skin:  Negative for rash.  Neurological:  Negative for dizziness.  Psychiatric/Behavioral:  Negative for depression. The patient is not nervous/anxious.       Past Medical History:  Diagnosis Date   Anemia    Anxiety    Brain lesion    Closed compression fracture of body of L1 vertebra 07/06/2019   GERD (gastroesophageal reflux disease)    Hyperlipidemia 07/19/2018   Hyperparathyroidism    Nondisplaced fracture of posterior process of right talus 07/09/2019   Numbness of lower extremity    Parathyroid abnormality     reports that she has never smoked. She has never used smokeless tobacco. She reports current alcohol use. She reports that she does not use drugs.   Current Outpatient Medications:    atorvastatin (LIPITOR) 10 MG tablet, Take 1 tablet (10 mg total) by mouth daily., Disp: 90 tablet, Rfl: 3   Cholecalciferol (VITAMIN D3) 1.25 MG (50000 UT) capsule, Take 1 capsule (50,000 Units total) by  mouth once a week., Disp: 12 capsule, Rfl: 0   clobetasol ointment (TEMOVATE) 0.05 %, Apply 1 Application topically 3 (three) times a week. Applied to scalp, Disp: , Rfl:    finasteride (PROSCAR) 5 MG tablet, Take 2.5 mg by mouth in the morning., Disp: , Rfl:    Multiple Vitamins-Minerals (ADULT ONE DAILY GUMMIES PO), Take 2 tablets by mouth in the morning., Disp: , Rfl:    pantoprazole  (PROTONIX) 20 MG tablet, Take 20 mg by mouth daily before breakfast., Disp: , Rfl:    valACYclovir (VALTREX) 500 MG tablet, Take 500 mg by mouth daily as needed (BREAKOUTS)., Disp: , Rfl:    Observations/Objective: Blood pressure 129/85, pulse 70, temperature 98.6 F (37 C), temperature source Axillary, height 5\' 4"  (1.626 m), weight 193 lb 3 oz (87.6 kg), last menstrual period 11/05/2020.  Physical Exam Constitutional:      General: The patient is not in acute distress. Pulmonary:     Effort: Pulmonary effort is normal. No respiratory distress.  Neurological:     Mental Status: The patient is alert and oriented to person, place, and time.  Psychiatric:        Mood and Affect: Mood normal.        Behavior: Behavior normal.    Assessment and Plan Abnormal laboratory test Assessment & Plan: Reviewed labs in detail with patient.  Very minimal change in creatinine likely due to decreased water intake and occasional use of nonsteroidal anti-inflammatories. Isolated alkaline phosphatase elevation is unremarkable and does not suggest liver issue.  I do believe she is cleared to use cholesterol medication followed for initiation of menopausal medication discussed.  Later Slight elevation in glucose but given patient was nonfasting this is likely within normal range. Sodium and chloride are very slightly elevated, encouraged patient to decrease salt in her diet.  No significant concerns noted.  Will reevaluate cholesterol, complete metabolic panel and prediabetes as planned in future.   High cholesterol -     Lipid panel; Future -     Comprehensive metabolic panel; Future  Prediabetes -     Hemoglobin A1c; Future      I discussed the assessment and treatment plan with the patient. The patient was provided an opportunity to ask questions and all were answered. The patient agreed with the plan and demonstrated an understanding of the instructions.   The patient was advised to call  back or seek an in-person evaluation if the symptoms worsen or if the condition fails to improve as anticipated.     Kerby NoraAmy Kaelei Wheeler, MD

## 2023-03-18 NOTE — Patient Instructions (Addendum)
Call to  cancel TOC with Tabitha  Okay to  TOC TO Swara Donze

## 2023-03-30 ENCOUNTER — Other Ambulatory Visit: Payer: Self-pay | Admitting: Gastroenterology

## 2023-04-03 ENCOUNTER — Telehealth: Payer: BC Managed Care – PPO | Admitting: Family Medicine

## 2023-04-03 DIAGNOSIS — R21 Rash and other nonspecific skin eruption: Secondary | ICD-10-CM

## 2023-04-03 DIAGNOSIS — M3500 Sicca syndrome, unspecified: Secondary | ICD-10-CM

## 2023-04-03 MED ORDER — TRIAMCINOLONE ACETONIDE 0.1 % EX CREA: 1.0000 | TOPICAL_CREAM | Freq: Two times a day (BID) | CUTANEOUS | 0 refills | Status: DC

## 2023-04-03 MED ORDER — PREDNISONE 20 MG PO TABS: 20.0000 mg | ORAL_TABLET | Freq: Two times a day (BID) | ORAL | 0 refills | Status: AC

## 2023-04-03 NOTE — Progress Notes (Signed)
Virtual Visit Consent   [name], you are scheduled for a virtual visit with a [facility] provider today. Just as with appointments in the office, your consent must be obtained to participate. Your consent will be active for this visit and any virtual visit you may have with one of our providers in the next 365 days. If you have a MyChart account, a copy of this consent can be sent to you electronically.  As this is a virtual visit, video technology does not allow for your provider to perform a traditional examination. This may limit your provider's ability to fully assess your condition. If your provider identifies any concerns that need to be evaluated in person or the need to arrange testing (such as labs, EKG, etc.), we will make arrangements to do so. Although advances in technology are sophisticated, we cannot ensure that it will always work on either your end or our end. If the connection with a video visit is poor, the visit may have to be switched to a telephone visit. With either a video or telephone visit, we are not always able to ensure that we have a secure connection.  By engaging in this virtual visit, you consent to the provision of healthcare and authorize for your insurance to be billed (if applicable) for the services provided during this visit. Depending on your insurance coverage, you may receive a charge related to this service.  I need to obtain your verbal consent now. Are you willing to proceed with your visit today? Ana Freeman has provided verbal consent on 04/03/2023 for a virtual visit (video or telephone). Georgana Curio, FNP  Date: 04/03/2023 8:44 AM  Virtual Visit via Video Note   I, Georgana Curio, connected with  Ana Freeman  ([id number], [birth year]) on 04/03/23 at  8:30 AM EDT by a video-enabled telemedicine application and verified that I am speaking with the correct person using two identifiers.  Location: Patient: Virtual Visit Location Patient:  Home Provider: Virtual Visit Location Provider: Home Office   I discussed the limitations of evaluation and management by telemedicine and the availability of in person appointments. The patient expressed understanding and agreed to proceed.    History of Present Illness: Ana Freeman is a 52 y.o. who identifies as a female who was assigned female at birth, and is being seen today for rash on left hip region itching, dry and scaling. She also has a dry mouth possibly from her alopecia meds .  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   Hyperkalemia 02/22/2022   Acute pain of right knee 10/12/2021   Right hand pain 10/12/2021   Elevated glucose 10/12/2021   COVID-19 virus infection 05/08/2021   Herpes 10/28/2020   Alopecia 10/28/2020   Numbness and tingling in left arm 10/28/2020   Left ankle swelling 10/28/2020   Acute stress disorder    Hyperlipidemia 07/19/2018   Chest pain of uncertain etiology 07/19/2018   GERD (gastroesophageal reflux disease) 10/28/2015   Nonspecific abnormal electrocardiogram (ECG) (EKG) 02/11/2015   Anemia, iron deficiency 12/20/2014    Allergies:  Allergies  Allergen Reactions   Bupropion  Palpitations   Chlorhexidine Gluconate Itching   Medications:  Current Outpatient Medications:    predniSONE (DELTASONE) 20 MG tablet, Take 1 tablet (20 mg total) by mouth 2 (two) times daily with a meal for 5 days., Disp: 10 tablet, Rfl: 0   triamcinolone cream (KENALOG) 0.1 %, Apply 1 Application topically 2 (two) times daily., Disp: 30 g, Rfl: 0   atorvastatin (LIPITOR) 10 MG  tablet, Take 1 tablet (10 mg total) by mouth daily., Disp: 90 tablet, Rfl: 3   Cholecalciferol (VITAMIN D3) 1.25 MG (50000 UT) capsule, Take 1 capsule (50,000 Units total) by mouth once a week., Disp: 12 capsule, Rfl: 0   clobetasol ointment (TEMOVATE) 0.05 %, Apply 1 Application topically 3 (three) times a week. Applied to scalp, Disp: , Rfl:    finasteride (PROSCAR) 5 MG tablet, Take 2.5 mg by mouth in the morning., Disp: , Rfl:    Multiple Vitamins-Minerals (ADULT ONE DAILY GUMMIES PO), Take 2 tablets by mouth in the morning., Disp: , Rfl:    pantoprazole (PROTONIX) 20 MG tablet, TAKE 1 TABLET (20 MG TOTAL) BY MOUTH 2 (TWO) TIMES DAILY. NEED OFFICE VISIT FOR FURTHER REFILLS, Disp: 60 tablet, Rfl: 0   valACYclovir (VALTREX) 500 MG tablet, Take 500 mg by mouth daily as needed (BREAKOUTS)., Disp: , Rfl:   Observations/Objective: Patient is well-developed, well-nourished in no acute distress.  Resting comfortably  at home.  Head is normocephalic, atraumatic.  No labored breathing.  Speech is clear and coherent with logical content.  Patient is alert and oriented at baseline.    Assessment and Plan: 1. Rash  2. Sicca, unspecified type    Follow Up Instructions: I discussed the assessment and treatment plan with the patient. The patient was provided an opportunity to ask questions and all were answered. The patient agreed with the plan and demonstrated an understanding of the instructions.  A copy of instructions were sent to the patient via MyChart unless otherwise noted below.     The patient was advised to call back or seek an in-person evaluation if the symptoms worsen or if the condition fails to improve as anticipated.  Time:  I spent 10 minutes with the patient via telehealth technology discussing the above problems/concerns.    Georgana Curio, FNP

## 2023-04-03 NOTE — Patient Instructions (Signed)
Rash, Adult A rash is a change in the color of your skin. A rash can also change the way your skin feels. There are many different conditions and factors that can cause a rash. Some rashes may disappear after a few days, but some may last for a few weeks. Common causes of rashes include: Viral infections, such as: Colds. Measles. Hand, foot, and mouth disease. Bacterial infections, such as: Scarlet fever. Impetigo. Fungal infections, such as Candida. Allergic reactions to food, medicines, or skin care products. Follow these instructions at home: The goal of treatment is to stop the itching and keep the rash from spreading. Pay attention to any changes in your symptoms. Follow these instructions to help with your condition: Medicine Take or apply over-the-counter and prescription medicines only as told by your health care provider. These may include: Corticosteroid creams to treat red or swollen skin. Anti-itch lotions. Oral allergy medicines (antihistamines). Oral corticosteroids for severe symptoms.  Skin care Apply cool compresses to the affected areas. Do not scratch or rub your skin. Avoid covering the rash. Make sure the rash is exposed to air as much as possible. Managing itching and discomfort Avoid hot showers or baths, which can make itching worse. A cold shower may help. Try taking a bath with: Epsom salts. Follow manufacturer instructions on the packaging. You can get these at your local pharmacy or grocery store. Baking soda. Pour a small amount into the bath as told by your health care provider. Colloidal oatmeal. Follow manufacturer instructions on the packaging. You can get this at your local pharmacy or grocery store. Try applying baking soda paste to your skin. Stir water into baking soda until it reaches a paste-like consistency. Try applying calamine lotion. This is an over-the-counter lotion that helps to relieve itchiness. Keep cool and out of the sun. Sweating  and being hot can make itching worse. General instructions  Rest as needed. Drink enough fluid to keep your urine pale yellow. Wear loose-fitting clothing. Avoid scented soaps, detergents, and perfumes. Use gentle soaps, detergents, perfumes, and other cosmetic products. Avoid any substance that causes your rash. Keep a journal to help track what causes your rash. Write down: What you eat. What cosmetic products you use. What you drink. What you wear. This includes jewelry. Keep all follow-up visits as told by your health care provider. This is important. Contact a health care provider if: You sweat at night. You lose weight. You urinate more than normal. You urinate less than normal, or you notice that your urine is a darker color than usual. You feel weak. You vomit. Your skin or the whites of your eyes look yellow (jaundice). Your skin: Tingles. Is numb. Your rash: Does not go away after several days. Gets worse. You are: Unusually thirsty. More tired than normal. You have: New symptoms. Pain in your abdomen. A fever. Diarrhea. Get help right away if you: Have a fever and your symptoms suddenly get worse. Develop confusion. Have a severe headache or a stiff neck. Have severe joint pains or stiffness. Have a seizure. Develop a rash that covers all or most of your body. The rash may or may not be painful. Develop blisters that: Are on top of the rash. Grow larger or grow together. Are painful. Are inside your nose or mouth. Develop a rash that: Looks like purple pinprick-sized spots all over your body. Has a "bull's eye" or looks like a target. Is not related to sun exposure, is red and painful, and causes   your skin to peel. Summary A rash is a change in the color of your skin. Some rashes disappear after a few days, but some may last for a few weeks. The goal of treatment is to stop the itching and keep the rash from spreading. Take or apply over-the-counter  and prescription medicines only as told by your health care provider. Contact a health care provider if you have new or worsening symptoms. Keep all follow-up visits as told by your health care provider. This is important. This information is not intended to replace advice given to you by your health care provider. Make sure you discuss any questions you have with your health care provider. Document Revised: 06/01/2022 Document Reviewed: 09/10/2021 Elsevier Patient Education  2023 [employer]  

## 2023-04-17 ENCOUNTER — Other Ambulatory Visit: Payer: Self-pay | Admitting: Gastroenterology

## 2023-04-17 ENCOUNTER — Other Ambulatory Visit: Payer: Self-pay | Admitting: Family Medicine

## 2023-04-19 NOTE — Telephone Encounter (Signed)
Should patient continue or does she need to change to otc dose?

## 2023-04-19 NOTE — Telephone Encounter (Signed)
Continue prescription strength dose weekly.

## 2023-04-28 ENCOUNTER — Encounter: Payer: BC Managed Care – PPO | Admitting: Family

## 2023-05-12 ENCOUNTER — Other Ambulatory Visit (INDEPENDENT_AMBULATORY_CARE_PROVIDER_SITE_OTHER): Payer: BC Managed Care – PPO

## 2023-05-12 DIAGNOSIS — R7303 Prediabetes: Secondary | ICD-10-CM | POA: Diagnosis not present

## 2023-05-12 DIAGNOSIS — E7800 Pure hypercholesterolemia, unspecified: Secondary | ICD-10-CM | POA: Diagnosis not present

## 2023-05-12 LAB — LIPID PANEL
Cholesterol: 163 mg/dL (ref 0–200)
HDL: 57.7 mg/dL (ref >39.00)
LDL Cholesterol: 97 mg/dL (ref 0–99)
NonHDL: 105.13
Total CHOL/HDL Ratio: 3
Triglycerides: 43 mg/dL (ref 0.0–149.0)
VLDL: 8.6 mg/dL (ref 0.0–40.0)

## 2023-05-12 LAB — COMPREHENSIVE METABOLIC PANEL
ALT: 24 U/L (ref 0–35)
AST: 25 U/L (ref 0–37)
Albumin: 4.2 g/dL (ref 3.5–5.2)
Alkaline Phosphatase: 102 U/L (ref 39–117)
BUN: 11 mg/dL (ref 6–23)
CO2: 28 mEq/L (ref 19–32)
Calcium: 9.5 mg/dL (ref 8.4–10.5)
Chloride: 105 mEq/L (ref 96–112)
Creatinine, Ser: 1.03 mg/dL (ref 0.40–1.20)
GFR: 63.42 mL/min (ref >60.00)
Glucose, Bld: 96 mg/dL (ref 70–99)
Potassium: 5.2 mEq/L — ABNORMAL HIGH (ref 3.5–5.1)
Sodium: 141 mEq/L (ref 135–145)
Total Bilirubin: 0.6 mg/dL (ref 0.2–1.2)
Total Protein: 7.7 g/dL (ref 6.0–8.3)

## 2023-05-12 LAB — HEMOGLOBIN A1C: Hgb A1c MFr Bld: 5.7 % (ref 4.6–6.5)

## 2023-05-13 ENCOUNTER — Other Ambulatory Visit: Payer: BC Managed Care – PPO

## 2023-05-19 DIAGNOSIS — L089 Local infection of the skin and subcutaneous tissue, unspecified: Secondary | ICD-10-CM | POA: Insufficient documentation

## 2023-05-20 ENCOUNTER — Ambulatory Visit: Payer: BC Managed Care – PPO | Admitting: Family Medicine

## 2023-05-20 ENCOUNTER — Encounter: Payer: Self-pay | Admitting: Family Medicine

## 2023-05-20 ENCOUNTER — Other Ambulatory Visit: Payer: Self-pay | Admitting: Family Medicine

## 2023-05-20 VITALS — BP 110/72 | HR 73 | Temp 97.5°F | Resp 16 | Ht 64.0 in | Wt 193.5 lb

## 2023-05-20 DIAGNOSIS — E7800 Pure hypercholesterolemia, unspecified: Secondary | ICD-10-CM | POA: Diagnosis not present

## 2023-05-20 DIAGNOSIS — E875 Hyperkalemia: Secondary | ICD-10-CM | POA: Diagnosis not present

## 2023-05-20 DIAGNOSIS — E210 Primary hyperparathyroidism: Secondary | ICD-10-CM

## 2023-05-20 DIAGNOSIS — B009 Herpesviral infection, unspecified: Secondary | ICD-10-CM

## 2023-05-20 DIAGNOSIS — K219 Gastro-esophageal reflux disease without esophagitis: Secondary | ICD-10-CM

## 2023-05-20 DIAGNOSIS — R7303 Prediabetes: Secondary | ICD-10-CM

## 2023-05-20 DIAGNOSIS — L659 Nonscarring hair loss, unspecified: Secondary | ICD-10-CM

## 2023-05-20 DIAGNOSIS — G939 Disorder of brain, unspecified: Secondary | ICD-10-CM | POA: Insufficient documentation

## 2023-05-20 DIAGNOSIS — R9431 Abnormal electrocardiogram [ECG] [EKG]: Secondary | ICD-10-CM

## 2023-05-20 DIAGNOSIS — R002 Palpitations: Secondary | ICD-10-CM

## 2023-05-20 DIAGNOSIS — Z862 Personal history of diseases of the blood and blood-forming organs and certain disorders involving the immune mechanism: Secondary | ICD-10-CM

## 2023-05-20 DIAGNOSIS — R413 Other amnesia: Secondary | ICD-10-CM

## 2023-05-20 DIAGNOSIS — R1013 Epigastric pain: Secondary | ICD-10-CM | POA: Insufficient documentation

## 2023-05-20 LAB — BASIC METABOLIC PANEL
BUN: 11 mg/dL (ref 6–23)
CO2: 29 mEq/L (ref 19–32)
Calcium: 9.9 mg/dL (ref 8.4–10.5)
Chloride: 106 mEq/L (ref 96–112)
Creatinine, Ser: 1.01 mg/dL (ref 0.40–1.20)
GFR: 64.92 mL/min (ref >60.00)
Glucose, Bld: 93 mg/dL (ref 70–99)
Potassium: 5.6 mEq/L — ABNORMAL HIGH (ref 3.5–5.1)
Sodium: 142 mEq/L (ref 135–145)

## 2023-05-20 MED ORDER — FUROSEMIDE 20 MG PO TABS: 20.0000 mg | ORAL_TABLET | Freq: Every day | ORAL | 0 refills | Status: DC

## 2023-05-20 NOTE — Assessment & Plan Note (Addendum)
Resolved at last check February 18, 2023 hemoglobin 13.7   Was due to menorrhagia in past... S/P TAH

## 2023-05-20 NOTE — Assessment & Plan Note (Signed)
Reviewed 2020 MRI brain... unenhancing stable lesion compared to 2016.. recommended repeat evaluation for stability.. will plan repeat MRI in next year.

## 2023-05-20 NOTE — Assessment & Plan Note (Signed)
Has noted improvement with lions mane

## 2023-05-20 NOTE — Patient Instructions (Signed)
Please stop at the lab to have labs drawn.  

## 2023-05-20 NOTE — Progress Notes (Signed)
Patient ID: Ana Freeman, female    DOB: July 31, 1971, 52 y.o.   MRN: [id number]  This visit was conducted in person.  BP 110/72   Pulse 73   Temp (!) 97.5 F (36.4 C)   Resp 16   Ht 5\' 4"  (1.626 m)   Wt 193 lb 8 oz (87.8 kg)   LMP 11/05/2020   SpO2 99%   BMI 33.21 kg/m    CC:  Chief Complaint  Patient presents with   Establish Care    Transferring care from Dr. Selena Batten    Subjective:   HPI: Ana DATTILO is a 52 y.o. female presenting on 05/20/2023 for Establish Care (Transferring care from Dr. Selena Batten)  Previous PCP: Selena Batten Last physical: Appears to be greater than 1 year ago.  GYN: 3 weeks ago.  HAS come off estradiol.  High potassium: Recent labs show potassium of 5.2   Iron deficiency anemia: Resolved at last check February 18, 2023 hemoglobin 13.7  Prediabetes  Lab Results  Component Value Date   HGBA1C 5.7 05/12/2023      High cholesterol well-controlled on atorvastatin 10 mg p.o. daily.... much better than last check.  No SE. Lab Results  Component Value Date   CHOL 163 05/12/2023   HDL 57.70 05/12/2023   LDLCALC 97 05/12/2023   TRIG 43.0 05/12/2023   CHOLHDL 3 05/12/2023   The 10-year ASCVD risk score (Arnett DK, et al., 2019) is: 0.8%   Values used to calculate the score:     Age: 47 years     Sex: Female     Is Non-Hispanic African American: Yes     Diabetic: No     Tobacco smoker: No     Systolic Blood Pressure: 110 mmHg     Is BP treated: No     HDL Cholesterol: 57.7 mg/dL     Total Cholesterol: 163 mg/dL  Wt Readings from Last 3 Encounters:  05/20/23 193 lb 8 oz (87.8 kg)  03/18/23 193 lb 3 oz (87.6 kg)  02/18/23 194 lb (88 kg)  Body mass index is 33.21 kg/m.   Relevant past medical, surgical, family and social history reviewed and updated as indicated. Interim medical history since our last visit reviewed. Allergies and medications reviewed and updated. Outpatient Medications Prior to Visit  Medication Sig Dispense Refill    atorvastatin (LIPITOR) 10 MG tablet Take 1 tablet (10 mg total) by mouth daily. 90 tablet 3   Cholecalciferol (VITAMIN D3) 1.25 MG (50000 UT) CAPS TAKE 1 CAPSULE BY MOUTH ONE TIME PER WEEK 12 capsule 0   clobetasol ointment (TEMOVATE) 0.05 % Apply 1 Application topically 3 (three) times a week. Applied to scalp     pantoprazole (PROTONIX) 20 MG tablet TAKE 1 TABLET (20 MG TOTAL) BY MOUTH 2 (TWO) TIMES DAILY. NEED OFFICE VISIT FOR FURTHER REFILLS 60 tablet 0   valACYclovir (VALTREX) 500 MG tablet Take 500 mg by mouth daily as needed (BREAKOUTS).     finasteride (PROSCAR) 5 MG tablet Take 2.5 mg by mouth in the morning. (Patient not taking: Reported on 05/20/2023)     Multiple Vitamins-Minerals (ADULT ONE DAILY GUMMIES PO) Take 2 tablets by mouth in the morning. (Patient not taking: Reported on 05/20/2023)     triamcinolone cream (KENALOG) 0.1 % Apply 1 Application topically 2 (two) times daily. (Patient not taking: Reported on 05/20/2023) 30 g 0   No facility-administered medications prior to visit.     Per HPI unless specifically indicated  in ROS section below Review of Systems  Constitutional:  Negative for fatigue and fever.  HENT:  Negative for congestion.   Eyes:  Negative for pain.  Respiratory:  Negative for cough and shortness of breath.   Cardiovascular:  Negative for chest pain, palpitations and leg swelling.  Gastrointestinal:  Negative for abdominal pain.  Genitourinary:  Negative for dysuria and vaginal bleeding.  Musculoskeletal:  Negative for back pain.  Neurological:  Negative for syncope, light-headedness and headaches.  Psychiatric/Behavioral:  Negative for dysphoric mood.    Objective:  BP 110/72   Pulse 73   Temp (!) 97.5 F (36.4 C)   Resp 16   Ht 5\' 4"  (1.626 m)   Wt 193 lb 8 oz (87.8 kg)   LMP 11/05/2020   SpO2 99%   BMI 33.21 kg/m   Wt Readings from Last 3 Encounters:  05/20/23 193 lb 8 oz (87.8 kg)  03/18/23 193 lb 3 oz (87.6 kg)  02/18/23 194 lb (88 kg)       Physical Exam Constitutional:      General: She is not in acute distress.    Appearance: Normal appearance. She is well-developed. She is not ill-appearing or toxic-appearing.  HENT:     Head: Normocephalic.     Right Ear: Hearing, tympanic membrane, ear canal and external ear normal. Tympanic membrane is not erythematous, retracted or bulging.     Left Ear: Hearing, tympanic membrane, ear canal and external ear normal. Tympanic membrane is not erythematous, retracted or bulging.     Nose: No mucosal edema or rhinorrhea.     Right Sinus: No maxillary sinus tenderness or frontal sinus tenderness.     Left Sinus: No maxillary sinus tenderness or frontal sinus tenderness.     Mouth/Throat:     Pharynx: Uvula midline.  Eyes:     General: Lids are normal. Lids are everted, no foreign bodies appreciated.     Conjunctiva/sclera: Conjunctivae normal.     Pupils: Pupils are equal, round, and reactive to light.  Neck:     Thyroid: No thyroid mass or thyromegaly.     Vascular: No carotid bruit.     Trachea: Trachea normal.  Cardiovascular:     Rate and Rhythm: Normal rate and regular rhythm.     Pulses: Normal pulses.     Heart sounds: Normal heart sounds, S1 normal and S2 normal. No murmur heard.    No friction rub. No gallop.  Pulmonary:     Effort: Pulmonary effort is normal. No tachypnea or respiratory distress.     Breath sounds: Normal breath sounds. No decreased breath sounds, wheezing, rhonchi or rales.  Abdominal:     General: Bowel sounds are normal.     Palpations: Abdomen is soft.     Tenderness: There is no abdominal tenderness.  Musculoskeletal:     Cervical back: Normal range of motion and neck supple.  Skin:    General: Skin is warm and dry.     Findings: No rash.  Neurological:     Mental Status: She is alert.  Psychiatric:        Mood and Affect: Mood is not anxious or depressed.        Speech: Speech normal.        Behavior: Behavior normal. Behavior is  cooperative.        Thought Content: Thought content normal.        Judgment: Judgment normal.       Results for orders  placed or performed in visit on 05/12/23  Lipid panel  Result Value Ref Range   Cholesterol 163 0 - 200 mg/dL   Triglycerides 16.1 0.0 - 149.0 mg/dL   HDL 09.60 >45.40 mg/dL   VLDL 8.6 0.0 - 98.1 mg/dL   LDL Cholesterol 97 0 - 99 mg/dL   Total CHOL/HDL Ratio 3    NonHDL 105.13   Comprehensive metabolic panel  Result Value Ref Range   Sodium 141 135 - 145 mEq/L   Potassium 5.2 slight hemolysis (H) 3.5 - 5.1 mEq/L   Chloride 105 96 - 112 mEq/L   CO2 28 19 - 32 mEq/L   Glucose, Bld 96 70 - 99 mg/dL   BUN 11 6 - 23 mg/dL   Creatinine, Ser 1.91 0.40 - 1.20 mg/dL   Total Bilirubin 0.6 0.2 - 1.2 mg/dL   Alkaline Phosphatase 102 39 - 117 U/L   AST 25 0 - 37 U/L   ALT 24 0 - 35 U/L   Total Protein 7.7 6.0 - 8.3 g/dL   Albumin 4.2 3.5 - 5.2 g/dL   GFR 47.82 >95.62 mL/min   Calcium 9.5 8.4 - 10.5 mg/dL  Hemoglobin Z3Y  Result Value Ref Range   Hgb A1c MFr Bld 5.7 4.6 - 6.5 %    Assessment and Plan  Hyperkalemia Assessment & Plan:  She  does not eat bananas. No multivitamin or supplement.  Sample was slightly hemplyzed.. recheck today.  Orders: -     Basic metabolic panel  Pure hypercholesterolemia Assessment & Plan: Stable, chronic.  Continue current medication.  well-controlled on atorvastatin 10 mg p.o. daily   Hyperparathyroidism, primary [facility]) Assessment & Plan: History of parathyroid adenoma Left inferior parathyroidectomy 01/2023 Calcium normal  now.     Prediabetes Assessment & Plan: Chronic, stable control with diet.   History of iron deficiency anemia Assessment & Plan: Resolved at last check February 18, 2023 hemoglobin 13.7   Was due to menorrhagia in past... S/P TAH   Nonspecific abnormal electrocardiogram (ECG) (EKG) Assessment & Plan: Evaluated Feb 2016 by Dr. Yates Decamp: Echocardiogram 01/21/2015- Left ventricle cavity is  normal in size; normal global wall motion and normal diastolic filling pattern. Calculated EF 67%. Trace mitral regurg/trace tricuspid regurg. No evidence of pulmonary HTN. Trace pulmonic regurg.    02/10/2015- Exercise stress test >> positive for ischemia, 2mm horizontal ST depression persisting for > 2 minutes in recovery. Consider Cardiac CT angio vs nuclear stress testing.... returned normal   Palpitations Assessment & Plan:  Holter monitor 2022.. showed NSR,  occ SVT and PVCs  Encouraged hydration, caffeine and econgestant avoidance.   Gastroesophageal reflux disease, unspecified whether esophagitis present Assessment & Plan: Stable, chronic.  Continue current medication.  Pantoprazole 20 mg daily  po daily. Can try to wean off.   Herpes Assessment & Plan: Oral and genital.  Using prn.   Alopecia Assessment & Plan:  Chronic, followed by dermatology..  Recently started minoxidil.   Memory changes Assessment & Plan:  Has noted improvement with lions mane   Left frontal lobe lesion Assessment & Plan:  Reviewed 2020 MRI brain... unenhancing stable lesion compared to 2016.. recommended repeat evaluation for stability.. will plan repeat MRI in next year.     Return in about 6 months (around 11/19/2023) for  follow up  with fasting labs prior.   Kerby Nora, MD

## 2023-05-20 NOTE — Assessment & Plan Note (Addendum)
She  does not eat bananas. No multivitamin or supplement.  Sample was slightly hemplyzed.. recheck today.

## 2023-05-20 NOTE — Assessment & Plan Note (Signed)
Chronic, stable control with diet 

## 2023-05-20 NOTE — Assessment & Plan Note (Signed)
Stable, chronic.  Continue current medication.  well-controlled on atorvastatin 10 mg p.o. daily

## 2023-05-20 NOTE — Assessment & Plan Note (Signed)
Holter monitor 2022.. showed NSR,  occ SVT and PVCs  Encouraged hydration, caffeine and econgestant avoidance.

## 2023-05-20 NOTE — Assessment & Plan Note (Signed)
Evaluated Feb 2016 by Dr. Yates Decamp: Echocardiogram 01/21/2015- Left ventricle cavity is normal in size; normal global wall motion and normal diastolic filling pattern. Calculated EF 67%. Trace mitral regurg/trace tricuspid regurg. No evidence of pulmonary HTN. Trace pulmonic regurg.    02/10/2015- Exercise stress test >> positive for ischemia, 2mm horizontal ST depression persisting for > 2 minutes in recovery. Consider Cardiac CT angio vs nuclear stress testing.... returned normal

## 2023-05-20 NOTE — Assessment & Plan Note (Addendum)
History of parathyroid adenoma Left inferior parathyroidectomy 01/2023 Calcium normal  now.

## 2023-05-20 NOTE — Assessment & Plan Note (Signed)
Stable, chronic.  Continue current medication.  Pantoprazole 20 mg daily  po daily. Can try to wean off.

## 2023-05-20 NOTE — Assessment & Plan Note (Signed)
Chronic, followed by dermatology..  Recently started minoxidil.

## 2023-05-20 NOTE — Assessment & Plan Note (Signed)
Oral and genital.  Using prn.

## 2023-05-25 ENCOUNTER — Telehealth: Payer: Self-pay | Admitting: *Deleted

## 2023-05-25 DIAGNOSIS — E875 Hyperkalemia: Secondary | ICD-10-CM

## 2023-05-25 NOTE — Telephone Encounter (Signed)
-----   Message from Alvina Chou sent at 05/23/2023  9:26 AM EDT ----- Regarding: lab orders for Friday, 6.14.24 Lab orders, thanks

## 2023-05-27 ENCOUNTER — Other Ambulatory Visit: Payer: Self-pay | Admitting: Family Medicine

## 2023-05-27 ENCOUNTER — Telehealth: Payer: Self-pay

## 2023-05-27 ENCOUNTER — Other Ambulatory Visit (INDEPENDENT_AMBULATORY_CARE_PROVIDER_SITE_OTHER): Payer: BC Managed Care – PPO

## 2023-05-27 DIAGNOSIS — E875 Hyperkalemia: Secondary | ICD-10-CM

## 2023-05-27 LAB — BASIC METABOLIC PANEL
BUN: 13 mg/dL (ref 6–23)
CO2: 28 mEq/L (ref 19–32)
Calcium: 9.1 mg/dL (ref 8.4–10.5)
Chloride: 105 mEq/L (ref 96–112)
Creatinine, Ser: 0.88 mg/dL (ref 0.40–1.20)
GFR: 76.58 mL/min (ref >60.00)
Glucose, Bld: 91 mg/dL (ref 70–99)
Potassium: 5.2 mEq/L — ABNORMAL HIGH (ref 3.5–5.1)
Sodium: 138 mEq/L (ref 135–145)

## 2023-05-27 MED ORDER — FUROSEMIDE 20 MG PO TABS: 20.0000 mg | ORAL_TABLET | Freq: Every day | ORAL | 0 refills | Status: DC

## 2023-05-27 NOTE — Telephone Encounter (Signed)
Lab has been ordered for lab appt next week.

## 2023-05-27 NOTE — Telephone Encounter (Signed)
-----   Message from Excell Seltzer, MD sent at 05/27/2023  5:12 PM EDT ----- Call  Potassium improved after Lasix and holding pantoprazole.  Still borderline high so I will send in another 3 days of Lasix to continue to flush potassium from the body.

## 2023-05-30 NOTE — Addendum Note (Signed)
Addended by: Damita Lack on: 05/30/2023 12:25 PM   Modules accepted: Orders

## 2023-06-03 ENCOUNTER — Other Ambulatory Visit: Payer: BC Managed Care – PPO

## 2023-06-06 ENCOUNTER — Other Ambulatory Visit (INDEPENDENT_AMBULATORY_CARE_PROVIDER_SITE_OTHER): Payer: BC Managed Care – PPO

## 2023-06-06 ENCOUNTER — Encounter: Payer: Self-pay | Admitting: Family Medicine

## 2023-06-06 DIAGNOSIS — E875 Hyperkalemia: Secondary | ICD-10-CM | POA: Diagnosis not present

## 2023-06-06 LAB — BASIC METABOLIC PANEL
BUN: 13 mg/dL (ref 6–23)
CO2: 29 mEq/L (ref 19–32)
Calcium: 10 mg/dL (ref 8.4–10.5)
Chloride: 106 mEq/L (ref 96–112)
Creatinine, Ser: 1 mg/dL (ref 0.40–1.20)
GFR: 65.68 mL/min (ref >60.00)
Glucose, Bld: 96 mg/dL (ref 70–99)
Potassium: 5.1 mEq/L (ref 3.5–5.1)
Sodium: 141 mEq/L (ref 135–145)

## 2023-06-17 ENCOUNTER — Encounter: Payer: Self-pay | Admitting: Family Medicine

## 2023-06-17 ENCOUNTER — Ambulatory Visit: Payer: BC Managed Care – PPO | Admitting: Family Medicine

## 2023-06-17 VITALS — BP 100/72 | HR 83 | Temp 97.4°F | Ht 64.0 in | Wt 196.0 lb

## 2023-06-17 DIAGNOSIS — E875 Hyperkalemia: Secondary | ICD-10-CM | POA: Diagnosis not present

## 2023-06-17 DIAGNOSIS — G5601 Carpal tunnel syndrome, right upper limb: Secondary | ICD-10-CM

## 2023-06-17 DIAGNOSIS — K219 Gastro-esophageal reflux disease without esophagitis: Secondary | ICD-10-CM

## 2023-06-17 LAB — BASIC METABOLIC PANEL
BUN: 10 mg/dL (ref 6–23)
CO2: 28 mEq/L (ref 19–32)
Calcium: 10 mg/dL (ref 8.4–10.5)
Chloride: 107 mEq/L (ref 96–112)
Creatinine, Ser: 0.99 mg/dL (ref 0.40–1.20)
GFR: 66.46 mL/min (ref >60.00)
Glucose, Bld: 95 mg/dL (ref 70–99)
Potassium: 5.3 mEq/L — ABNORMAL HIGH (ref 3.5–5.1)
Sodium: 140 mEq/L (ref 135–145)

## 2023-06-17 NOTE — Progress Notes (Signed)
Patient ID: Ana Freeman, female    DOB: [birth year], 52 y.o.   MRN: [id number]  This visit was conducted in person.  BP 100/72 (BP Location: Left Arm, Patient Position: Sitting, Cuff Size: Large)   Pulse 83   Temp (!) 97.4 F (36.3 C) (Temporal)   Ht 5\' 4"  (1.626 m)   Wt 196 lb (88.9 kg)   LMP 11/05/2020   SpO2 98%   BMI 33.64 kg/m    CC:  Chief Complaint  Patient presents with   Hyperkalemia    Subjective:   HPI: Ana Freeman is a 52 y.o. female with hyperparathyroidism, parathyroid adenoma, prediabetes presenting on 06/17/2023 for Hyperkalemia  She has had persistently high potassium on no potassium supplement and with  minimal potassium intake.  Unclear reason for high potassium... she has been on PPI, which could contribute.   At last check  06/06/23.Marland Kitchen potassium back in nml range after several doses of lasix.   No clear cause of increase potassium release from cells like rhabdo, diabetes, etc.  Not on NSAIDs, ACE/ARB etc.   She has been able to stop the pantoprazole... having some heartburn but was not on diet in last 2  week.    She is feeling well overall. Has noted intermittent tingling in fingers.  Notes most often when she wakes up.  She does bend her wrist when she sleeps.  She is on the keyboard a lot during the day.  She describes the tingling as being in her bilateral first 3 digits primarily on the right.  Good water intake 64 oz a day She has started adding Crystal light which does contain some potassium. Relevant past medical, surgical, family and social history reviewed and updated as indicated. Interim medical history since our last visit reviewed. Allergies and medications reviewed and updated. Outpatient Medications Prior to Visit  Medication Sig Dispense Refill   atorvastatin (LIPITOR) 10 MG tablet Take 1 tablet (10 mg total) by mouth daily. 90 tablet 3   clobetasol ointment (TEMOVATE) 0.05 % Apply 1 Application topically 3 (three) times a  week. Applied to scalp     minoxidil (LONITEN) 2.5 MG tablet Take 1.25 mg by mouth daily.     pantoprazole (PROTONIX) 20 MG tablet TAKE 1 TABLET (20 MG TOTAL) BY MOUTH 2 (TWO) TIMES DAILY. NEED OFFICE VISIT FOR FURTHER REFILLS 60 tablet 0   valACYclovir (VALTREX) 500 MG tablet Take 500 mg by mouth daily as needed (BREAKOUTS).     Cholecalciferol (VITAMIN D3) 1.25 MG (50000 UT) CAPS TAKE 1 CAPSULE BY MOUTH ONE TIME PER WEEK 12 capsule 0   furosemide (LASIX) 20 MG tablet Take 1 tablet (20 mg total) by mouth daily. 3 tablet 0   No facility-administered medications prior to visit.     Per HPI unless specifically indicated in ROS section below Review of Systems  Constitutional:  Negative for fatigue and fever.  HENT:  Negative for congestion.   Eyes:  Negative for pain.  Respiratory:  Negative for cough and shortness of breath.   Cardiovascular:  Negative for chest pain, palpitations and leg swelling.  Gastrointestinal:  Negative for abdominal pain.  Genitourinary:  Negative for dysuria and vaginal bleeding.  Musculoskeletal:  Negative for back pain.  Neurological:  Negative for syncope, light-headedness and headaches.  Psychiatric/Behavioral:  Negative for dysphoric mood.    Objective:  BP 100/72 (BP Location: Left Arm, Patient Position: Sitting, Cuff Size: Large)   Pulse 83   Temp (!) 97.4  F (36.3 C) (Temporal)   Ht 5\' 4"  (1.626 m)   Wt 196 lb (88.9 kg)   LMP 11/05/2020   SpO2 98%   BMI 33.64 kg/m   Wt Readings from Last 3 Encounters:  06/17/23 196 lb (88.9 kg)  05/20/23 193 lb 8 oz (87.8 kg)  03/18/23 193 lb 3 oz (87.6 kg)      Physical Exam Constitutional:      General: She is not in acute distress.    Appearance: Normal appearance. She is well-developed. She is not ill-appearing or toxic-appearing.  HENT:     Head: Normocephalic.     Right Ear: Hearing, tympanic membrane, ear canal and external ear normal. Tympanic membrane is not erythematous, retracted or bulging.      Left Ear: Hearing, tympanic membrane, ear canal and external ear normal. Tympanic membrane is not erythematous, retracted or bulging.     Nose: No mucosal edema or rhinorrhea.     Right Sinus: No maxillary sinus tenderness or frontal sinus tenderness.     Left Sinus: No maxillary sinus tenderness or frontal sinus tenderness.     Mouth/Throat:     Pharynx: Uvula midline.  Eyes:     General: Lids are normal. Lids are everted, no foreign bodies appreciated.     Conjunctiva/sclera: Conjunctivae normal.     Pupils: Pupils are equal, round, and reactive to light.  Neck:     Thyroid: No thyroid mass or thyromegaly.     Vascular: No carotid bruit.     Trachea: Trachea normal.  Cardiovascular:     Rate and Rhythm: Normal rate and regular rhythm.     Pulses: Normal pulses.     Heart sounds: Normal heart sounds, S1 normal and S2 normal. No murmur heard.    No friction rub. No gallop.  Pulmonary:     Effort: Pulmonary effort is normal. No tachypnea or respiratory distress.     Breath sounds: Normal breath sounds. No decreased breath sounds, wheezing, rhonchi or rales.  Abdominal:     General: Bowel sounds are normal.     Palpations: Abdomen is soft.     Tenderness: There is no abdominal tenderness.  Musculoskeletal:     Cervical back: Normal range of motion and neck supple.  Skin:    General: Skin is warm and dry.     Findings: No rash.  Neurological:     Mental Status: She is alert.  Psychiatric:        Mood and Affect: Mood is not anxious or depressed.        Speech: Speech normal.        Behavior: Behavior normal. Behavior is cooperative.        Thought Content: Thought content normal.        Judgment: Judgment normal.   Negative Tinel and Phalen, negative ulnar compression.    Results for orders placed or performed in visit on 06/06/23  Basic Metabolic Panel (BMET)  Result Value Ref Range   Sodium 141 135 - 145 mEq/L   Potassium 5.1 3.5 - 5.1 mEq/L   Chloride 106 96 - 112  mEq/L   CO2 29 19 - 32 mEq/L   Glucose, Bld 96 70 - 99 mg/dL   BUN 13 6 - 23 mg/dL   Creatinine, Ser 8.29 0.40 - 1.20 mg/dL   GFR 56.21 >30.86 mL/min   Calcium 10.0 8.4 - 10.5 mg/dL    Assessment and Plan  Hyperkalemia Assessment & Plan: Acute, but persistent despite  stopping all supplements.  The potassium continued to elevate once she stopped doing Lasix to lower it. She has since stopped PPI for the last 2 weeks. No clear evidence of increased potassium release from cells.  Will consider decreased potassium metabolism. We will recheck potassium levels and kidney function today.  Will also do a spot urinalysis for potassium, creatinine and osmolality to consider hypoaldosteronism.   Orders: -     Potassium, urine, random -     Creatinine, urine, random -     Osmolality, urine -     Basic metabolic panel  Gastroesophageal reflux disease, unspecified whether esophagitis present Assessment & Plan: Chronic, given PPI stopped for hyperkalemia, I encouraged her to use Pepcid AC twice daily for reflux symptoms.   Acute carpal tunnel syndrome, right Assessment & Plan: Acute, tingling in fingers most likely related to this. Recommended carpal tunnel brace on right arm at bedtime. Of note B12 normal last labs  2 months ago     No follow-ups on file.   Kerby Nora, MD

## 2023-06-17 NOTE — Assessment & Plan Note (Addendum)
Acute, but persistent despite stopping all supplements.  The potassium continued to elevate once she stopped doing Lasix to lower it. She has since stopped PPI for the last 2 weeks. No clear evidence of increased potassium release from cells.  Will consider decreased potassium metabolism. We will recheck potassium levels and kidney function today.  Will also do a spot urinalysis for potassium, creatinine and osmolality to consider hypoaldosteronism.

## 2023-06-17 NOTE — Assessment & Plan Note (Signed)
Chronic, given PPI stopped for hyperkalemia, I encouraged her to use Pepcid AC twice daily for reflux symptoms.

## 2023-06-17 NOTE — Assessment & Plan Note (Signed)
Acute, tingling in fingers most likely related to this. Recommended carpal tunnel brace on right arm at bedtime. Of note B12 normal last labs  2 months ago

## 2023-06-19 LAB — POTASSIUM, URINE, RANDOM: Potassium Urine: 64 mmol/L (ref 12–129)

## 2023-06-19 LAB — OSMOLALITY, URINE: Osmolality, Ur: 720 mOsm/kg (ref 50–1200)

## 2023-06-19 LAB — CREATININE, URINE, RANDOM: Creatinine, Urine: 169 mg/dL (ref 20–275)

## 2023-07-30 ENCOUNTER — Telehealth: Payer: BC Managed Care – PPO | Admitting: Physician Assistant

## 2023-07-30 DIAGNOSIS — J069 Acute upper respiratory infection, unspecified: Secondary | ICD-10-CM | POA: Diagnosis not present

## 2023-07-30 MED ORDER — FLUTICASONE PROPIONATE 50 MCG/ACT NA SUSP: 2.0000 | Freq: Every day | NASAL | 6 refills | Status: DC

## 2023-07-30 MED ORDER — BENZONATATE 100 MG PO CAPS: 100.0000 mg | ORAL_CAPSULE | Freq: Three times a day (TID) | ORAL | 0 refills | Status: DC | PRN

## 2023-07-30 NOTE — Progress Notes (Signed)
E-Visit for [employer] Virus / COVID Screening  Your current symptoms could be consistent with COVID.  Please complete a Covid test either at home or check with your local pharmacy to see if they provide testing.    There are antiviral medications like Paxlovid.  This is not necessary if you are having mild symptoms.  If you are interested in medications like this please schedule a Video Visit.   Isolation Instructions:   You are to isolate at home until you have been fever free for at least 24 hours without a fever-reducing medication, and symptoms have been steadily improving for 24 hours. At that time,  you can end isolation but need to mask for an additional 5 days.  If you must be around other household members who do not have symptoms, you need to make sure that both you and the family members are masking consistently with a high-quality mask.  If you note any worsening of symptoms despite treatment, please seek an in-person evaluation ASAP. If you note any significant shortness of breath or any chest pain, please seek ER evaluation. Please do not delay care!  Go to the nearest hospital ED for assessment if fever/cough/breathlessness are severe or illness seems like a threat to life.    The following symptoms may appear 2-14 days after exposure: Fever Cough Shortness of breath or difficulty breathing Chills Repeated shaking with chills Muscle pain Headache Sore throat New loss of taste or smell Fatigue Congestion or runny nose Nausea or vomiting Diarrhea  You can use medication such as prescription cough medication called Tessalon Perles 100 mg. You may take 1-2 capsules every 8 hours as needed for cough and prescription for Fluticasone nasal spray 2 sprays in each nostril one time per day  You may also take acetaminophen (Tylenol) as needed for fever.  HOME CARE Only take medications as instructed by your medical team. Drink plenty of fluids and get plenty of rest. A steam or  ultrasonic humidifier can help if you have congestion.  GET HELP RIGHT AWAY IF YOU HAVE EMERGENCY WARNING SIGNS.  Call 911 or proceed to your closest emergency facility if: You develop worsening high fever. Trouble breathing Bluish lips or face Persistent pain or pressure in the chest New confusion Inability to wake or stay awake You cough up blood. Your symptoms become more severe Inability to hold down food or fluids  This list is not all possible symptoms. Contact your medical provider for any symptoms that are severe or concerning to you.   Your e-visit answers were reviewed by a board certified advanced clinical practitioner to complete your personal care plan.  Depending on the condition, your plan could have included both over the counter or prescription medications.  If there is a problem, please reply once you have received a response from your provider.  Your safety is important to [place].  If you have drug allergies check your prescription carefully.    You can use MyChart to ask questions about today's visit, request a non-urgent call back, or ask for a work or school excuse for 24 hours related to this e-Visit. If it has been greater than 24 hours you will need to follow up with your provider or enter a new e-Visit to address those concerns. You will get an e-mail in the next two days asking about your experience.  I hope that your e-visit has been valuable and will speed your recovery. Thank you for using e-visits.  I have spent 5  minutes in review of e-visit questionnaire, review and updating patient chart, medical decision making and response to patient.   Tylene Fantasia Ward, PA-C

## 2023-08-10 ENCOUNTER — Encounter: Payer: Self-pay | Admitting: Family Medicine

## 2023-10-04 DIAGNOSIS — Z23 Encounter for immunization: Secondary | ICD-10-CM | POA: Diagnosis not present

## 2023-10-10 ENCOUNTER — Encounter: Payer: Self-pay | Admitting: Family Medicine

## 2023-10-10 NOTE — Telephone Encounter (Signed)
I spoke with pt; pt said for about 10 months pt has had lt shoulder tightness on and off;movement does not affect the feeling in shoulder.pt said has seen card with testing and test were OK. Pt is always concerned about her heart but pt did not have CP or SOB. Pt has reflux and indigestion on and off. Pt said she has no hx of heart issues. Pt request appt with Dr Ermalene Searing. Pt scheduled appt with Dr Ermalene Searing on 10/11/23 at 8:40 and with UC & ED precautions and pt voiced understanding. Sending note to Dr Ermalene Searing and [place] pool.

## 2023-10-11 ENCOUNTER — Other Ambulatory Visit: Payer: Self-pay | Admitting: Family Medicine

## 2023-10-11 ENCOUNTER — Ambulatory Visit: Payer: BC Managed Care – PPO | Admitting: Family Medicine

## 2023-10-11 ENCOUNTER — Encounter: Payer: Self-pay | Admitting: Family Medicine

## 2023-10-11 VITALS — BP 90/70 | HR 76 | Temp 97.5°F | Ht 64.0 in | Wt 193.4 lb

## 2023-10-11 DIAGNOSIS — R202 Paresthesia of skin: Secondary | ICD-10-CM | POA: Diagnosis not present

## 2023-10-11 DIAGNOSIS — R0789 Other chest pain: Secondary | ICD-10-CM

## 2023-10-11 DIAGNOSIS — E875 Hyperkalemia: Secondary | ICD-10-CM | POA: Diagnosis not present

## 2023-10-11 LAB — COMPREHENSIVE METABOLIC PANEL
ALT: 24 U/L (ref 0–35)
AST: 17 U/L (ref 0–37)
Albumin: 4.2 g/dL (ref 3.5–5.2)
Alkaline Phosphatase: 93 U/L (ref 39–117)
BUN: 11 mg/dL (ref 6–23)
CO2: 29 meq/L (ref 19–32)
Calcium: 10.1 mg/dL (ref 8.4–10.5)
Chloride: 106 meq/L (ref 96–112)
Creatinine, Ser: 0.97 mg/dL (ref 0.40–1.20)
GFR: 67.96 mL/min (ref >60.00)
Glucose, Bld: 93 mg/dL (ref 70–99)
Potassium: 5.3 meq/L — ABNORMAL HIGH (ref 3.5–5.1)
Sodium: 140 meq/L (ref 135–145)
Total Bilirubin: 0.4 mg/dL (ref 0.2–1.2)
Total Protein: 7 g/dL (ref 6.0–8.3)

## 2023-10-11 LAB — TSH: TSH: 1.07 u[IU]/mL (ref 0.35–5.50)

## 2023-10-11 LAB — VITAMIN B12: Vitamin B-12: 346 pg/mL (ref 211–911)

## 2023-10-11 MED ORDER — FUROSEMIDE 20 MG PO TABS: 20.0000 mg | ORAL_TABLET | Freq: Every day | ORAL | 0 refills | Status: DC

## 2023-10-11 NOTE — Patient Instructions (Signed)
Famotidine  ( Pepcid  AC) trwice daily for reflux.

## 2023-10-12 NOTE — Telephone Encounter (Signed)
Looks like this was in regards to labs done recently.

## 2023-10-13 ENCOUNTER — Other Ambulatory Visit: Payer: Self-pay | Admitting: Family Medicine

## 2023-10-13 DIAGNOSIS — E875 Hyperkalemia: Secondary | ICD-10-CM

## 2023-10-14 DIAGNOSIS — R0789 Other chest pain: Secondary | ICD-10-CM | POA: Insufficient documentation

## 2023-10-14 NOTE — Assessment & Plan Note (Signed)
Acute, no changes on EKG.  Will move forward with lab evaluation.  At least in part the pain is due to poorly controlled reflux.  Will have her start Pepcid AC twice daily to avoid use of PPI with potassium

## 2023-10-14 NOTE — Assessment & Plan Note (Signed)
Chronic intermittent. Concern for elevated potassium given patient's symptoms today Unclear as to why patient continues to have elevated potassium levels. Possible adrenal insufficiency although initial testing was normal.  Will consider referral back to endocrinologist to hopefully assist with further recommendations to evaluate this issue versus referral to renal.  Current kidney function within the normal range.

## 2023-10-14 NOTE — Assessment & Plan Note (Signed)
Chronic, intermittent She states that some of the tingling electric shock symptoms she has seem to be associated with when she has elevated potassium.  We will move forward with reevaluation of her electrolytes.  If not improving consider neurology referral for nerve conduction testing.

## 2023-10-28 ENCOUNTER — Telehealth: Payer: Self-pay | Admitting: *Deleted

## 2023-10-28 ENCOUNTER — Encounter: Payer: Self-pay | Admitting: Internal Medicine

## 2023-10-28 ENCOUNTER — Telehealth (INDEPENDENT_AMBULATORY_CARE_PROVIDER_SITE_OTHER): Payer: BC Managed Care – PPO | Admitting: Internal Medicine

## 2023-10-28 VITALS — Ht 64.0 in

## 2023-10-28 DIAGNOSIS — Z862 Personal history of diseases of the blood and blood-forming organs and certain disorders involving the immune mechanism: Secondary | ICD-10-CM

## 2023-10-28 DIAGNOSIS — E7849 Other hyperlipidemia: Secondary | ICD-10-CM

## 2023-10-28 DIAGNOSIS — E042 Nontoxic multinodular goiter: Secondary | ICD-10-CM | POA: Diagnosis not present

## 2023-10-28 DIAGNOSIS — R7303 Prediabetes: Secondary | ICD-10-CM

## 2023-10-28 NOTE — Telephone Encounter (Signed)
See My Chart Message.

## 2023-10-28 NOTE — Telephone Encounter (Signed)
-----   Message from Lovena Neighbours sent at 10/28/2023  1:31 PM EST ----- Regarding: Fasting labs for Monday 12.2.24 Please put lab orders in future. Thank you, Denny Peon
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
## 2023-10-28 NOTE — Telephone Encounter (Signed)
Please contact patient.  I noted she saw endocrinologist today on video visit and they did not feel that the potassium issue is from adrenal insufficiency and therefore recommended renal referral.  Please let me know if she has placed a referral to a kidney specialist or if I can go ahead and do so.  Location preference.

## 2023-10-31 NOTE — Telephone Encounter (Signed)
Pt has already seen the Endocrinologist (last week)   The patient stated in the previous message that she needs a referral to Nephrologist. See messages below  Please advise, thanks.

## 2023-10-31 NOTE — Telephone Encounter (Signed)
Can we check on her endocrinology referral?

## 2023-11-01 ENCOUNTER — Other Ambulatory Visit: Payer: Self-pay | Admitting: Family Medicine

## 2023-11-01 DIAGNOSIS — E875 Hyperkalemia: Secondary | ICD-10-CM

## 2023-11-02 ENCOUNTER — Other Ambulatory Visit: Payer: BC Managed Care – PPO

## 2023-11-03 ENCOUNTER — Inpatient Hospital Stay
Admission: RE | Admit: 2023-11-03 | Discharge: 2023-11-03 | Discharge status: Home / Self Care | Payer: BC Managed Care – PPO | Source: Ambulatory Visit | Attending: Internal Medicine | Admitting: Internal Medicine

## 2023-11-03 DIAGNOSIS — E042 Nontoxic multinodular goiter: Secondary | ICD-10-CM

## 2023-11-03 DIAGNOSIS — H43313 Vitreous membranes and strands, bilateral: Secondary | ICD-10-CM | POA: Diagnosis not present

## 2023-11-03 DIAGNOSIS — H53143 Visual discomfort, bilateral: Secondary | ICD-10-CM | POA: Diagnosis not present

## 2023-11-03 DIAGNOSIS — E041 Nontoxic single thyroid nodule: Secondary | ICD-10-CM | POA: Diagnosis not present

## 2023-11-03 DIAGNOSIS — H04123 Dry eye syndrome of bilateral lacrimal glands: Secondary | ICD-10-CM | POA: Diagnosis not present

## 2023-11-07 ENCOUNTER — Encounter: Payer: Self-pay | Admitting: *Deleted

## 2023-11-07 ENCOUNTER — Telehealth: Payer: Self-pay | Admitting: Family Medicine

## 2023-11-07 NOTE — Telephone Encounter (Signed)
Pt called in and stated that she has not gotten a call back regarding the referral and would like for the Cma to give her a call back soon as possible

## 2023-11-07 NOTE — Telephone Encounter (Signed)
Her referral was faxed and a Mychart message and letter was sent to her with information regarding her referral.  They will review her referral and will call her to schedule. She may also reach out to them.

## 2023-11-09 DIAGNOSIS — E875 Hyperkalemia: Secondary | ICD-10-CM | POA: Diagnosis not present

## 2023-11-14 ENCOUNTER — Other Ambulatory Visit (INDEPENDENT_AMBULATORY_CARE_PROVIDER_SITE_OTHER): Payer: BC Managed Care – PPO

## 2023-11-14 DIAGNOSIS — E7849 Other hyperlipidemia: Secondary | ICD-10-CM

## 2023-11-14 DIAGNOSIS — R7303 Prediabetes: Secondary | ICD-10-CM

## 2023-11-14 LAB — LIPID PANEL
Cholesterol: 178 mg/dL (ref 0–200)
HDL: 58.3 mg/dL (ref >39.00)
LDL Cholesterol: 107 mg/dL — ABNORMAL HIGH (ref 0–99)
NonHDL: 119.35
Total CHOL/HDL Ratio: 3
Triglycerides: 64 mg/dL (ref 0.0–149.0)
VLDL: 12.8 mg/dL (ref 0.0–40.0)

## 2023-11-14 LAB — COMPREHENSIVE METABOLIC PANEL
ALT: 28 U/L (ref 0–35)
AST: 21 U/L (ref 0–37)
Albumin: 4.4 g/dL (ref 3.5–5.2)
Alkaline Phosphatase: 105 U/L (ref 39–117)
BUN: 12 mg/dL (ref 6–23)
CO2: 29 meq/L (ref 19–32)
Calcium: 10.2 mg/dL (ref 8.4–10.5)
Chloride: 105 meq/L (ref 96–112)
Creatinine, Ser: 0.98 mg/dL (ref 0.40–1.20)
GFR: 67.08 mL/min (ref >60.00)
Glucose, Bld: 97 mg/dL (ref 70–99)
Potassium: 5.1 meq/L (ref 3.5–5.1)
Sodium: 140 meq/L (ref 135–145)
Total Bilirubin: 0.6 mg/dL (ref 0.2–1.2)
Total Protein: 7.8 g/dL (ref 6.0–8.3)

## 2023-11-14 LAB — HEMOGLOBIN A1C: Hgb A1c MFr Bld: 5.8 % (ref 4.6–6.5)

## 2023-11-15 NOTE — Progress Notes (Signed)
No critical labs need to be addressed urgently. We will discuss labs in detail at upcoming office visit.   

## 2023-11-18 ENCOUNTER — Encounter: Payer: Self-pay | Admitting: Family Medicine

## 2023-11-18 ENCOUNTER — Ambulatory Visit: Payer: BC Managed Care – PPO | Admitting: Family Medicine

## 2023-11-18 VITALS — BP 100/72 | HR 81 | Temp 98.0°F | Ht 63.62 in | Wt 192.0 lb

## 2023-11-18 DIAGNOSIS — E210 Primary hyperparathyroidism: Secondary | ICD-10-CM | POA: Diagnosis not present

## 2023-11-18 DIAGNOSIS — E875 Hyperkalemia: Secondary | ICD-10-CM

## 2023-11-18 DIAGNOSIS — R7303 Prediabetes: Secondary | ICD-10-CM

## 2023-11-18 DIAGNOSIS — E66811 Obesity, class 1: Secondary | ICD-10-CM | POA: Diagnosis not present

## 2023-11-18 DIAGNOSIS — E7800 Pure hypercholesterolemia, unspecified: Secondary | ICD-10-CM | POA: Diagnosis not present

## 2023-11-18 DIAGNOSIS — Z0000 Encounter for general adult medical examination without abnormal findings: Secondary | ICD-10-CM | POA: Diagnosis not present

## 2023-11-18 DIAGNOSIS — H04123 Dry eye syndrome of bilateral lacrimal glands: Secondary | ICD-10-CM | POA: Insufficient documentation

## 2023-11-18 DIAGNOSIS — R682 Dry mouth, unspecified: Secondary | ICD-10-CM

## 2023-11-18 NOTE — Assessment & Plan Note (Signed)
Ongoing for a year.  No clear medication side effects.  Discussed increasing hydration.  If this continues to be a problem we can look into evaluation for Sjogren's.

## 2023-11-18 NOTE — Patient Instructions (Addendum)
Work on regular exercise

## 2023-11-18 NOTE — Assessment & Plan Note (Signed)
Chronic, well-controlled on atorvastatin 10 mg p.o. daily  0.6% 10-year ASCVD risk score.

## 2023-11-18 NOTE — Assessment & Plan Note (Signed)
Encouraged exercise, weight loss, healthy eating habits. ? ?

## 2023-11-18 NOTE — Progress Notes (Signed)
Patient ID: Ana Freeman, female    DOB: [birth year], 52 y.o.   MRN: [id number]  This visit was conducted in person.  BP 100/72 (BP Location: Right Arm, Patient Position: Sitting, Cuff Size: Large)   Pulse 81   Temp 98 F (36.7 C) (Temporal)   Ht 5' 3.62" (1.616 m)   Wt 192 lb (87.1 kg)   LMP 11/05/2020   SpO2 98%   BMI 33.35 kg/m    CC:  Chief Complaint  Patient presents with   Medical Management of Chronic Issues    Subjective:   HPI: Ana Freeman is a 52 y.o. female presenting on 11/18/2023 for Medical Management of Chronic Issues  The patient presents for complete physical and review of chronic health problems. She also has the following acute concerns today:  Persistent hyperkalemia: Saw endocrinology and felt not due to to endocrine issue.  Referral in process for renal evaluation,  Dr. Cherylann Ratel.. reviewed last OV note 11/09/2023  Has follow up 12/05/2023  Hyperparathyroidism, primary: Followed by endocrinology Multiple thyroid nodules  Elevated Cholesterol:  Atorvastain 10 mg daily. Lab Results  Component Value Date   CHOL 178 11/14/2023   HDL 58.30 11/14/2023   LDLCALC 107 (H) 11/14/2023   TRIG 64.0 11/14/2023   CHOLHDL 3 11/14/2023   The 10-year ASCVD risk score (Arnett DK, et al., 2019) is: 0.6%   Values used to calculate the score:     Age: 19 years     Sex: Female     Is Non-Hispanic African American: Yes     Diabetic: No     Tobacco smoker: No     Systolic Blood Pressure: 100 mmHg     Is BP treated: No     HDL Cholesterol: 58.3 mg/dL     Total Cholesterol: 178 mg/dL Using medications without problems: Muscle aches:  Diet compliance: heart health Exercise: minimal Other complaints:   Prediabetes  Lab Results  Component Value Date   HGBA1C 5.8 11/14/2023        Relevant past medical, surgical, family and social history reviewed and updated as indicated. Interim medical history since our last visit reviewed. Allergies and  medications reviewed and updated. Outpatient Medications Prior to Visit  Medication Sig Dispense Refill   atorvastatin (LIPITOR) 10 MG tablet Take 1 tablet (10 mg total) by mouth daily. 90 tablet 3   clobetasol ointment (TEMOVATE) 0.05 % Apply 1 Application topically 3 (three) times a week. Applied to scalp     minoxidil (LONITEN) 2.5 MG tablet Take 1.25 mg by mouth daily.     valACYclovir (VALTREX) 500 MG tablet Take 500 mg by mouth daily as needed (BREAKOUTS).     furosemide (LASIX) 20 MG tablet Take 1 tablet (20 mg total) by mouth daily. (Patient not taking: Reported on 10/28/2023) 3 tablet 0   No facility-administered medications prior to visit.     Per HPI unless specifically indicated in ROS section below Review of Systems  Objective:  BP 100/72 (BP Location: Right Arm, Patient Position: Sitting, Cuff Size: Large)   Pulse 81   Temp 98 F (36.7 C) (Temporal)   Ht 5' 3.62" (1.616 m)   Wt 192 lb (87.1 kg)   LMP 11/05/2020   SpO2 98%   BMI 33.35 kg/m   Wt Readings from Last 3 Encounters:  11/18/23 192 lb (87.1 kg)  10/11/23 193 lb 6 oz (87.7 kg)  06/17/23 196 lb (88.9 kg)      Physical Exam  Results for orders placed or performed in visit on 11/14/23  Comprehensive metabolic panel  Result Value Ref Range   Sodium 140 135 - 145 mEq/L   Potassium 5.1 3.5 - 5.1 mEq/L   Chloride 105 96 - 112 mEq/L   CO2 29 19 - 32 mEq/L   Glucose, Bld 97 70 - 99 mg/dL   BUN 12 6 - 23 mg/dL   Creatinine, Ser 4.54 0.40 - 1.20 mg/dL   Total Bilirubin 0.6 0.2 - 1.2 mg/dL   Alkaline Phosphatase 105 39 - 117 U/L   AST 21 0 - 37 U/L   ALT 28 0 - 35 U/L   Total Protein 7.8 6.0 - 8.3 g/dL   Albumin 4.4 3.5 - 5.2 g/dL   GFR 09.81 >19.14 mL/min   Calcium 10.2 8.4 - 10.5 mg/dL  Lipid panel  Result Value Ref Range   Cholesterol 178 0 - 200 mg/dL   Triglycerides 78.2 0.0 - 149.0 mg/dL   HDL 95.62 >13.08 mg/dL   VLDL 65.7 0.0 - 84.6 mg/dL   LDL Cholesterol 962 (H) 0 - 99 mg/dL   Total  CHOL/HDL Ratio 3    NonHDL 119.35   Hemoglobin A1c  Result Value Ref Range   Hgb A1c MFr Bld 5.8 4.6 - 6.5 %    Assessment and Plan The patient's preventative maintenance and recommended screening tests for an annual wellness exam were reviewed in full today. Brought up to date unless services declined.  Counselled on the importance of diet, exercise, and its role in overall health and mortality. The patient's FH and SH was reviewed, including their home life, tobacco status, and drug and alcohol status.   Vaccines: Uptodate Td,  Consider Shingrix. Pap/DVE:   2021, followed by OB/GYN. Mammo: 06/2021.. will request Bone Density: lower risk Colon: 2022,  sessile polypDr. Armbruster repeat in 5 years  Smoking Status: none ETOH/ drug XBM:[id number]./KGMW  Hep C:  done  HIV screen:   done    Routine general medical examination at a health care facility  Pure hypercholesterolemia Assessment & Plan: Chronic, well-controlled on atorvastatin 10 mg p.o. daily  0.6% 10-year ASCVD risk score.   Hyperparathyroidism, primary [facility]) Assessment & Plan: History of parathyroid adenoma Left inferior parathyroidectomy 01/2023 Calcium normal  now.     Obesity (BMI 30.0-34.9) Assessment & Plan: Encouraged exercise, weight loss, healthy eating habits.    Prediabetes Assessment & Plan: Borderline Encouraged exercise, weight loss, healthy eating habits.    Hyperkalemia Assessment & Plan: Reviewed office visit with Dr. Lourdes Sledge, renal.  Feels mild hyperkalemia is on worrisome. No further evaluation needed. Working on low potassium diet. She does have follow-up with him in 1 month.   Dry mouth and eyes Assessment & Plan: Ongoing for a year.  No clear medication side effects.  Discussed increasing hydration.  If this continues to be a problem we can look into evaluation for Sjogren's.      Return in about 1 year (around 11/17/2024) for annual physical with fasting labs prior.   Kerby Nora, MD

## 2023-11-18 NOTE — Assessment & Plan Note (Signed)
Borderline Encouraged exercise, weight loss, healthy eating habits.

## 2023-11-18 NOTE — Assessment & Plan Note (Signed)
Reviewed office visit with Dr. Lourdes Sledge, renal.  Feels mild hyperkalemia is on worrisome. No further evaluation needed. Working on low potassium diet. She does have follow-up with him in 1 month.

## 2023-11-18 NOTE — Assessment & Plan Note (Signed)
History of parathyroid adenoma Left inferior parathyroidectomy 01/2023 Calcium normal  now.

## 2023-12-05 DIAGNOSIS — E875 Hyperkalemia: Secondary | ICD-10-CM | POA: Diagnosis not present

## 2023-12-18 ENCOUNTER — Encounter: Payer: Self-pay | Admitting: Family Medicine

## 2023-12-25 ENCOUNTER — Encounter: Payer: Self-pay | Admitting: Family Medicine

## 2023-12-26 DIAGNOSIS — H04123 Dry eye syndrome of bilateral lacrimal glands: Secondary | ICD-10-CM | POA: Diagnosis not present

## 2023-12-26 DIAGNOSIS — M25562 Pain in left knee: Secondary | ICD-10-CM | POA: Diagnosis not present

## 2023-12-26 DIAGNOSIS — M25552 Pain in left hip: Secondary | ICD-10-CM | POA: Diagnosis not present

## 2023-12-28 ENCOUNTER — Encounter: Payer: Self-pay | Admitting: Family Medicine

## 2023-12-29 ENCOUNTER — Other Ambulatory Visit: Payer: Self-pay | Admitting: Student

## 2023-12-29 DIAGNOSIS — M25562 Pain in left knee: Secondary | ICD-10-CM

## 2023-12-29 DIAGNOSIS — S83282A Other tear of lateral meniscus, current injury, left knee, initial encounter: Secondary | ICD-10-CM

## 2024-01-11 ENCOUNTER — Other Ambulatory Visit: Payer: Self-pay | Admitting: Family Medicine

## 2024-01-11 ENCOUNTER — Encounter: Payer: Self-pay | Admitting: Family Medicine

## 2024-01-11 DIAGNOSIS — Z789 Other specified health status: Secondary | ICD-10-CM | POA: Insufficient documentation

## 2024-01-18 ENCOUNTER — Ambulatory Visit
Admission: RE | Admit: 2024-01-18 | Discharge: 2024-01-18 | Disposition: A | Discharge status: Home / Self Care | Payer: BC Managed Care – PPO | Source: Ambulatory Visit | Attending: Student | Admitting: Student

## 2024-01-18 DIAGNOSIS — M25562 Pain in left knee: Secondary | ICD-10-CM

## 2024-01-18 DIAGNOSIS — R937 Abnormal findings on diagnostic imaging of other parts of musculoskeletal system: Secondary | ICD-10-CM | POA: Diagnosis not present

## 2024-01-30 DIAGNOSIS — E875 Hyperkalemia: Secondary | ICD-10-CM | POA: Diagnosis not present

## 2024-02-11 ENCOUNTER — Other Ambulatory Visit: Payer: Self-pay | Admitting: Family Medicine

## 2024-02-13 DIAGNOSIS — E875 Hyperkalemia: Secondary | ICD-10-CM | POA: Diagnosis not present

## 2024-03-09 ENCOUNTER — Encounter: Payer: BC Managed Care – PPO | Admitting: Family Medicine

## 2024-03-14 ENCOUNTER — Other Ambulatory Visit: Payer: Self-pay | Admitting: Family Medicine

## 2024-03-14 DIAGNOSIS — E7849 Other hyperlipidemia: Secondary | ICD-10-CM

## 2024-03-15 ENCOUNTER — Encounter: Payer: Self-pay | Admitting: Family Medicine

## 2024-03-15 ENCOUNTER — Other Ambulatory Visit (INDEPENDENT_AMBULATORY_CARE_PROVIDER_SITE_OTHER)

## 2024-03-15 ENCOUNTER — Encounter: Admitting: Family Medicine

## 2024-03-15 DIAGNOSIS — E7849 Other hyperlipidemia: Secondary | ICD-10-CM

## 2024-03-15 DIAGNOSIS — R41840 Attention and concentration deficit: Secondary | ICD-10-CM

## 2024-03-15 DIAGNOSIS — R278 Other lack of coordination: Secondary | ICD-10-CM

## 2024-03-15 LAB — LIPID PANEL
Cholesterol: 252 mg/dL — ABNORMAL HIGH (ref 0–200)
HDL: 58.5 mg/dL (ref >39.00)
LDL Cholesterol: 183 mg/dL — ABNORMAL HIGH (ref 0–99)
NonHDL: 193.73
Total CHOL/HDL Ratio: 4
Triglycerides: 54 mg/dL (ref 0.0–149.0)
VLDL: 10.8 mg/dL (ref 0.0–40.0)

## 2024-03-15 LAB — COMPREHENSIVE METABOLIC PANEL WITH GFR
ALT: 24 U/L (ref 0–35)
AST: 18 U/L (ref 0–37)
Albumin: 4.4 g/dL (ref 3.5–5.2)
Alkaline Phosphatase: 95 U/L (ref 39–117)
BUN: 15 mg/dL (ref 6–23)
CO2: 30 meq/L (ref 19–32)
Calcium: 9.9 mg/dL (ref 8.4–10.5)
Chloride: 104 meq/L (ref 96–112)
Creatinine, Ser: 1.03 mg/dL (ref 0.40–1.20)
GFR: 63.04 mL/min (ref >60.00)
Glucose, Bld: 108 mg/dL — ABNORMAL HIGH (ref 70–99)
Potassium: 5.4 meq/L — ABNORMAL HIGH (ref 3.5–5.1)
Sodium: 138 meq/L (ref 135–145)
Total Bilirubin: 0.4 mg/dL (ref 0.2–1.2)
Total Protein: 7.7 g/dL (ref 6.0–8.3)

## 2024-03-16 ENCOUNTER — Other Ambulatory Visit: Payer: Self-pay | Admitting: Family Medicine

## 2024-03-16 MED ORDER — EZETIMIBE 10 MG PO TABS: 10.0000 mg | ORAL_TABLET | Freq: Every day | ORAL | 3 refills | Status: DC

## 2024-03-21 NOTE — Addendum Note (Signed)
 Addended byKerby Nora E on: 03/21/2024 01:00 PM   Modules accepted: Orders

## 2024-03-26 ENCOUNTER — Other Ambulatory Visit: Payer: Self-pay | Admitting: Obstetrics and Gynecology

## 2024-03-26 DIAGNOSIS — Z139 Encounter for screening, unspecified: Secondary | ICD-10-CM | POA: Diagnosis not present

## 2024-03-26 DIAGNOSIS — Z6830 Body mass index (BMI) 30.0-30.9, adult: Secondary | ICD-10-CM | POA: Diagnosis not present

## 2024-03-26 DIAGNOSIS — Z01419 Encounter for gynecological examination (general) (routine) without abnormal findings: Secondary | ICD-10-CM | POA: Diagnosis not present

## 2024-03-26 DIAGNOSIS — E049 Nontoxic goiter, unspecified: Secondary | ICD-10-CM

## 2024-03-26 DIAGNOSIS — Z1231 Encounter for screening mammogram for malignant neoplasm of breast: Secondary | ICD-10-CM | POA: Diagnosis not present

## 2024-03-26 LAB — HM MAMMOGRAPHY

## 2024-03-29 DIAGNOSIS — L658 Other specified nonscarring hair loss: Secondary | ICD-10-CM | POA: Diagnosis not present

## 2024-03-30 DIAGNOSIS — L658 Other specified nonscarring hair loss: Secondary | ICD-10-CM | POA: Diagnosis not present

## 2024-04-11 DIAGNOSIS — N951 Menopausal and female climacteric states: Secondary | ICD-10-CM | POA: Diagnosis not present

## 2024-04-17 ENCOUNTER — Encounter: Payer: Self-pay | Admitting: Family Medicine

## 2024-04-17 ENCOUNTER — Ambulatory Visit: Admitting: Family Medicine

## 2024-04-17 VITALS — BP 110/60 | HR 87 | Temp 97.8°F | Ht 63.6 in | Wt 190.0 lb

## 2024-04-17 DIAGNOSIS — N951 Menopausal and female climacteric states: Secondary | ICD-10-CM | POA: Diagnosis not present

## 2024-04-17 MED ORDER — VENLAFAXINE HCL ER 37.5 MG PO CP24: 37.5000 mg | ORAL_CAPSULE | Freq: Every day | ORAL | 3 refills | Status: DC

## 2024-04-17 NOTE — Assessment & Plan Note (Signed)
 Chronic, ongoing since hysterectomy. She has been seeing her gynecologist regarding this for several years.  She is having many symptoms of menopause but the most bothersome to her at this point is her decreased motivation, anhedonia and decreased libido. We discussed likely focusing on treatment of mood would be most helpful.  This is been suggested by her gynecologist as well. I encouraged her to start venlafaxine 37.5 mg daily with slow start, gradual increase. Reviewed time course needed for effect. She will follow-up in 1 month.  In the future she can also consider addi for libido issues or vaginal estrogen for vaginal dryness. If hot flashes continue she can again consider videos as recommended by GYN.

## 2024-04-17 NOTE — Progress Notes (Signed)
 Patient ID: Ana Freeman, female    DOB: Aug 07, 1971, 53 y.o.   MRN: [id number]  This visit was conducted in person.  BP 110/60   Pulse 87   Temp 97.8 F (36.6 C) (Temporal)   Ht 5' 3.6" (1.615 m)   Wt 190 lb (86.2 kg)   LMP 11/05/2020   SpO2 97%   BMI 33.02 kg/m    CC:  Chief Complaint  Patient presents with   Menopause    Subjective:   HPI: Ana Freeman is a 53 y.o. female presenting on 04/17/2024 for Menopause  Reviewed recent office visit with Dr. Adelene Homer GYN on April 11, 2024 LAVH/BSO 11-19-20  She was seen for hot flashes and decreased libido.   She has tried treatment with venlafaxine in 2022 did not start it given concern about possible side effect of decreased libido. Trial of Veozah Has been on estrogen hormone replacement in the past but had stopped this due to side effects... heart racing.  Considering addi for libido issues.  Has hot flashes, decreased motivation, feels like she has no feeling, decreased libido, hair thinning, moody. Vaginal dryness.      Relevant past medical, surgical, family and social history reviewed and updated as indicated. Interim medical history since our last visit reviewed. Allergies and medications reviewed and updated. Outpatient Medications Prior to Visit  Medication Sig Dispense Refill   clobetasol ointment (TEMOVATE) 0.05 % Apply 1 Application topically 2 (two) times daily.     ezetimibe  (ZETIA ) 10 MG tablet Take 1 tablet (10 mg total) by mouth daily. 90 tablet 3   minoxidil (LONITEN) 2.5 MG tablet Take 1.25 mg by mouth daily.     Fezolinetant (VEOZAH) 45 MG TABS Take 1 tablet by mouth daily. (Patient not taking: Reported on 04/17/2024)     atorvastatin  (LIPITOR) 10 MG tablet TAKE 1 TABLET BY MOUTH EVERY DAY 90 tablet 3   No facility-administered medications prior to visit.     Per HPI unless specifically indicated in ROS section below Review of Systems  Constitutional:  Negative for fatigue and fever.  HENT:   Negative for congestion.   Eyes:  Negative for pain.  Respiratory:  Negative for cough and shortness of breath.   Cardiovascular:  Negative for chest pain, palpitations and leg swelling.  Gastrointestinal:  Negative for abdominal pain.  Genitourinary:  Negative for dysuria and vaginal bleeding.  Musculoskeletal:  Negative for back pain.  Neurological:  Negative for syncope, light-headedness and headaches.  Psychiatric/Behavioral:  Positive for dysphoric mood.    Objective:  BP 110/60   Pulse 87   Temp 97.8 F (36.6 C) (Temporal)   Ht 5' 3.6" (1.615 m)   Wt 190 lb (86.2 kg)   LMP 11/05/2020   SpO2 97%   BMI 33.02 kg/m   Wt Readings from Last 3 Encounters:  04/17/24 190 lb (86.2 kg)  11/18/23 192 lb (87.1 kg)  10/11/23 193 lb 6 oz (87.7 kg)      Physical Exam Constitutional:      General: She is not in acute distress.    Appearance: Normal appearance. She is well-developed. She is not ill-appearing or toxic-appearing.  HENT:     Head: Normocephalic.     Right Ear: Hearing, tympanic membrane, ear canal and external ear normal. Tympanic membrane is not erythematous, retracted or bulging.     Left Ear: Hearing, tympanic membrane, ear canal and external ear normal. Tympanic membrane is not erythematous, retracted or bulging.  Nose: No mucosal edema or rhinorrhea.     Right Sinus: No maxillary sinus tenderness or frontal sinus tenderness.     Left Sinus: No maxillary sinus tenderness or frontal sinus tenderness.     Mouth/Throat:     Pharynx: Uvula midline.  Eyes:     General: Lids are normal. Lids are everted, no foreign bodies appreciated.     Conjunctiva/sclera: Conjunctivae normal.     Pupils: Pupils are equal, round, and reactive to light.  Neck:     Thyroid : No thyroid  mass or thyromegaly.     Vascular: No carotid bruit.     Trachea: Trachea normal.  Cardiovascular:     Rate and Rhythm: Normal rate and regular rhythm.     Pulses: Normal pulses.     Heart sounds:  Normal heart sounds, S1 normal and S2 normal. No murmur heard.    No friction rub. No gallop.  Pulmonary:     Effort: Pulmonary effort is normal. No tachypnea or respiratory distress.     Breath sounds: Normal breath sounds. No decreased breath sounds, wheezing, rhonchi or rales.  Abdominal:     General: Bowel sounds are normal.     Palpations: Abdomen is soft.     Tenderness: There is no abdominal tenderness.  Musculoskeletal:     Cervical back: Normal range of motion and neck supple.  Skin:    General: Skin is warm and dry.     Findings: No rash.  Neurological:     Mental Status: She is alert.  Psychiatric:        Mood and Affect: Mood is depressed. Mood is not anxious. Affect is flat.        Speech: Speech normal.        Behavior: Behavior is withdrawn. Behavior is cooperative.        Thought Content: Thought content normal.        Judgment: Judgment normal.       Results for orders placed or performed in visit on 03/15/24  Comprehensive metabolic panel   Collection Time: 03/15/24  8:22 AM  Result Value Ref Range   Sodium 138 135 - 145 mEq/L   Potassium 5.4 (H) 3.5 - 5.1 mEq/L   Chloride 104 96 - 112 mEq/L   CO2 30 19 - 32 mEq/L   Glucose, Bld 108 (H) 70 - 99 mg/dL   BUN 15 6 - 23 mg/dL   Creatinine, Ser 9.32 0.40 - 1.20 mg/dL   Total Bilirubin 0.4 0.2 - 1.2 mg/dL   Alkaline Phosphatase 95 39 - 117 U/L   AST 18 0 - 37 U/L   ALT 24 0 - 35 U/L   Total Protein 7.7 6.0 - 8.3 g/dL   Albumin 4.4 3.5 - 5.2 g/dL   GFR 35.57 >32.20 mL/min   Calcium  9.9 8.4 - 10.5 mg/dL  Lipid panel   Collection Time: 03/15/24  8:22 AM  Result Value Ref Range   Cholesterol 252 (H) 0 - 200 mg/dL   Triglycerides 25.4 0.0 - 149.0 mg/dL   HDL 27.06 >23.76 mg/dL   VLDL 28.3 0.0 - 15.1 mg/dL   LDL Cholesterol 761 (H) 0 - 99 mg/dL   Total CHOL/HDL Ratio 4    NonHDL 193.73     Assessment and Plan  Menopausal syndrome Assessment & Plan: Chronic, ongoing since hysterectomy. She has been  seeing her gynecologist regarding this for several years.  She is having many symptoms of menopause but the most bothersome to her  at this point is her decreased motivation, anhedonia and decreased libido. We discussed likely focusing on treatment of mood would be most helpful.  This is been suggested by her gynecologist as well. I encouraged her to start venlafaxine 37.5 mg daily with slow start, gradual increase. Reviewed time course needed for effect. She will follow-up in 1 month.  In the future she can also consider addi for libido issues or vaginal estrogen for vaginal dryness. If hot flashes continue she can again consider videos as recommended by GYN.   Other orders -     Venlafaxine HCl ER; Take 1 capsule (37.5 mg total) by mouth daily with breakfast.  Dispense: 30 capsule; Refill: 3    Return in about 4 weeks (around 05/15/2024) for  virtual visit, follow up mood/menopause.   Herby Lolling, MD

## 2024-04-24 ENCOUNTER — Encounter: Payer: Self-pay | Admitting: Family Medicine

## 2024-05-10 ENCOUNTER — Other Ambulatory Visit: Payer: Self-pay | Admitting: Family Medicine

## 2024-05-13 ENCOUNTER — Encounter: Payer: Self-pay | Admitting: Family Medicine

## 2024-05-13 DIAGNOSIS — R251 Tremor, unspecified: Secondary | ICD-10-CM

## 2024-05-28 ENCOUNTER — Other Ambulatory Visit (INDEPENDENT_AMBULATORY_CARE_PROVIDER_SITE_OTHER)

## 2024-05-28 DIAGNOSIS — R251 Tremor, unspecified: Secondary | ICD-10-CM | POA: Diagnosis not present

## 2024-05-28 NOTE — Addendum Note (Signed)
 Addended by: Bernadene Brewer on: 05/28/2024 08:51 AM   Modules accepted: Orders

## 2024-05-29 LAB — CBC WITH DIFFERENTIAL/PLATELET
Absolute Lymphocytes: 1242 {cells}/uL (ref 850–3900)
Absolute Monocytes: 314 {cells}/uL (ref 200–950)
Basophils Absolute: 29 {cells}/uL (ref 0–200)
Basophils Relative: 0.9 %
Eosinophils Absolute: 70 {cells}/uL (ref 15–500)
Eosinophils Relative: 2.2 %
HCT: 41.4 % (ref 35.0–45.0)
Hemoglobin: 13.3 g/dL (ref 11.7–15.5)
MCH: 28.1 pg (ref 27.0–33.0)
MCHC: 32.1 g/dL (ref 32.0–36.0)
MCV: 87.5 fL (ref 80.0–100.0)
MPV: 9.7 fL (ref 7.5–12.5)
Monocytes Relative: 9.8 %
Neutro Abs: 1546 {cells}/uL (ref 1500–7800)
Neutrophils Relative %: 48.3 %
Platelets: 247 10*3/uL (ref 140–400)
RBC: 4.73 10*6/uL (ref 3.80–5.10)
RDW: 13.3 % (ref 11.0–15.0)
Total Lymphocyte: 38.8 %
WBC: 3.2 10*3/uL — ABNORMAL LOW (ref 3.8–10.8)

## 2024-05-29 LAB — TSH: TSH: 1.42 m[IU]/L

## 2024-05-29 LAB — BASIC METABOLIC PANEL WITH GFR
BUN: 10 mg/dL (ref 7–25)
CO2: 27 mmol/L (ref 20–32)
Calcium: 9.7 mg/dL (ref 8.6–10.4)
Chloride: 108 mmol/L (ref 98–110)
Creat: 0.92 mg/dL (ref 0.50–1.03)
Glucose, Bld: 99 mg/dL (ref 65–99)
Potassium: 5.1 mmol/L (ref 3.5–5.3)
Sodium: 142 mmol/L (ref 135–146)
eGFR: 75 mL/min/{1.73_m2} (ref >=60)

## 2024-05-31 ENCOUNTER — Ambulatory Visit: Payer: Self-pay | Admitting: Family Medicine

## 2024-06-14 ENCOUNTER — Telehealth: Admitting: Physician Assistant

## 2024-06-14 DIAGNOSIS — R4189 Other symptoms and signs involving cognitive functions and awareness: Secondary | ICD-10-CM | POA: Diagnosis not present

## 2024-06-14 DIAGNOSIS — F419 Anxiety disorder, unspecified: Secondary | ICD-10-CM | POA: Diagnosis not present

## 2024-06-14 DIAGNOSIS — Z780 Asymptomatic menopausal state: Secondary | ICD-10-CM | POA: Diagnosis not present

## 2024-06-14 MED ORDER — HYDROXYZINE HCL 10 MG PO TABS
10.0000 mg | ORAL_TABLET | Freq: Three times a day (TID) | ORAL | 0 refills | Status: AC | PRN
Start: 2024-06-14 — End: ?

## 2024-06-14 MED ORDER — PAROXETINE HCL 10 MG PO TABS
10.0000 mg | ORAL_TABLET | Freq: Every day | ORAL | 0 refills | Status: DC
Start: 2024-06-14 — End: 2024-07-10

## 2024-06-14 NOTE — Progress Notes (Signed)
 Virtual Visit Consent   Ana Freeman, you are scheduled for a virtual visit with a [facility] provider today. Just as with appointments in the office, your consent must be obtained to participate. Your consent will be active for this visit and any virtual visit you may have with one of our providers in the next 365 days. If you have a MyChart account, a copy of this consent can be sent to you electronically.  As this is a virtual visit, video technology does not allow for your provider to perform a traditional examination. This may limit your provider's ability to fully assess your condition. If your provider identifies any concerns that need to be evaluated in person or the need to arrange testing (such as labs, EKG, etc.), we will make arrangements to do so. Although advances in technology are sophisticated, we cannot ensure that it will always work on either your end or our end. If the connection with a video visit is poor, the visit may have to be switched to a telephone visit. With either a video or telephone visit, we are not always able to ensure that we have a secure connection.  By engaging in this virtual visit, you consent to the provision of healthcare and authorize for your insurance to be billed (if applicable) for the services provided during this visit. Depending on your insurance coverage, you may receive a charge related to this service.  I need to obtain your verbal consent now. Are you willing to proceed with your visit today? [name] has provided verbal consent on 06/14/2024 for a virtual visit (video or telephone). [name], PA-C  Date: 06/14/2024 1:24 PM   Virtual Visit via Video Note   IDelon CHRISTELLA Dickinson, connected with  CONLEY PAWLING  ([id number], 29-Feb-1971) on 06/14/24 at 11:30 AM EDT by a video-enabled telemedicine application and verified that I am speaking with the correct person using two identifiers.  Location: Patient: Virtual Visit  Location Patient: Home Provider: Virtual Visit Location Provider: Home Office   I discussed the limitations of evaluation and management by telemedicine and the availability of in person appointments. The patient expressed understanding and agreed to proceed.    History of Present Illness: Ana Freeman is a 53 y.o. who identifies as a female who was assigned female at birth, and is being seen today for possible menopausal symptoms. Having increased anxiety, emotional lability, brain fog, worry. States she feels like she is losing her mind some days. Today she was in a meeting and they were calling on her, but she felt like she had no clue what they were talking about at the time. She also just feels on edge and like crying all the time for no reason. She reports having an important interview next week and was practicing questions with AI and could not even answer those questions.   This has been ongoing and just worsened acutely today with feeling like she is not even in her own body. She was recently placed on Venlafaxine  37.5mg , but did not tolerate as it caused her to have palpitations. She has had similar side effects with Wellbutrin in the past as well.   She did undergo total hysterectomy in 2021. This put her into surgical menopause. She has been followed by GYN and did attempt HRT, but was unable to tolerate that as well due to side effects. Of recent, she has been followed with her PCP for further management.    Problems:  Patient Active Problem List   Diagnosis Date Noted   Menopausal syndrome 04/17/2024   Statin intolerance 01/11/2024   Dry mouth and eyes 11/18/2023   Chest tightness 10/14/2023   Acute carpal tunnel syndrome, right 06/17/2023   Palpitations 05/20/2023   Left frontal lobe lesion 05/20/2023   Prediabetes 03/18/2023   Memory changes 02/18/2023   Hyperparathyroidism, primary (HCC) 01/07/2023   Multiple thyroid  nodules 01/07/2023   Obesity (BMI 30.0-34.9)  05/07/2022   Parathyroid  adenoma 02/22/2022   Hyperkalemia 02/22/2022   Herpes 10/28/2020   Alopecia 10/28/2020   Tingling of left upper extremity 10/28/2020   Hyperlipidemia 07/19/2018   GERD (gastroesophageal reflux disease) 10/28/2015   Nonspecific abnormal electrocardiogram (ECG) (EKG) 02/11/2015   History of iron  deficiency anemia 12/20/2014    Allergies:  Allergies  Allergen Reactions   Bupropion Palpitations   Chlorhexidine  Gluconate Itching   Medications:  Current Outpatient Medications:    hydrOXYzine  (ATARAX ) 10 MG tablet, Take 1 tablet (10 mg total) by mouth 3 (three) times daily as needed., Disp: 30 tablet, Rfl: 0   PARoxetine (PAXIL) 10 MG tablet, Take 1 tablet (10 mg total) by mouth daily., Disp: 30 tablet, Rfl: 0   clobetasol ointment (TEMOVATE) 0.05 %, Apply 1 Application topically 2 (two) times daily., Disp: , Rfl:    ezetimibe  (ZETIA ) 10 MG tablet, Take 1 tablet (10 mg total) by mouth daily., Disp: 90 tablet, Rfl: 3   Fezolinetant (VEOZAH) 45 MG TABS, Take 1 tablet by mouth daily. (Patient not taking: Reported on 04/17/2024), Disp: , Rfl:    minoxidil (LONITEN) 2.5 MG tablet, Take 1.25 mg by mouth daily., Disp: , Rfl:   Observations/Objective: Patient is well-developed, well-nourished in no acute distress.  Resting comfortably at home.  Head is normocephalic, atraumatic.  No labored breathing.  Speech is clear and coherent with logical content.  Patient is alert and oriented at baseline.    Assessment and Plan: 1. Menopause (Primary) - PARoxetine (PAXIL) 10 MG tablet; Take 1 tablet (10 mg total) by mouth daily.  Dispense: 30 tablet; Refill: 0 - hydrOXYzine  (ATARAX ) 10 MG tablet; Take 1 tablet (10 mg total) by mouth 3 (three) times daily as needed.  Dispense: 30 tablet; Refill: 0  2. Anxiety - PARoxetine (PAXIL) 10 MG tablet; Take 1 tablet (10 mg total) by mouth daily.  Dispense: 30 tablet; Refill: 0 - hydrOXYzine  (ATARAX ) 10 MG tablet; Take 1 tablet (10 mg  total) by mouth 3 (three) times daily as needed.  Dispense: 30 tablet; Refill: 0  3. Brain fog - PARoxetine (PAXIL) 10 MG tablet; Take 1 tablet (10 mg total) by mouth daily.  Dispense: 30 tablet; Refill: 0 - hydrOXYzine  (ATARAX ) 10 MG tablet; Take 1 tablet (10 mg total) by mouth 3 (three) times daily as needed.  Dispense: 30 tablet; Refill: 0  - Worsening anxiety and depression symptoms with brain fog most likely from menopause, could be a mixed component of both  - Discussed options and patient agreeable to try Paroxetine - She is aware if palpitations arise she can stop  - Hydroxyzine  added for as needed anxiety attacks or heightened emotions - Recommend to follow up with PCP in 4-6 weeks, or sooner if not tolerating medications   Follow Up Instructions: I discussed the assessment and treatment plan with the patient. The patient was provided an opportunity to ask questions and all were answered. The patient agreed with the plan and demonstrated an understanding of the instructions.  A copy of instructions were sent to  the patient via MyChart unless otherwise noted below.    The patient was advised to call back or seek an in-person evaluation if the symptoms worsen or if the condition fails to improve as anticipated.    [name], PA-C

## 2024-06-14 NOTE — Patient Instructions (Signed)
 Ana Freeman, thank you for joining [name], PA-C for today's virtual visit.  While this provider is not your primary care provider (PCP), if your PCP is located in our provider database this encounter information will be shared with them immediately following your visit.   A [facility] MyChart account gives you access to today's visit and all your visits, tests, and labs performed at [facility]  click here if you don't have a [facility] MyChart account or go to [email or web address]  Consent: (Patient) Ana Freeman provided verbal consent for this virtual visit at the beginning of the encounter.  Current Medications:  Current Outpatient Medications:    hydrOXYzine  (ATARAX ) 10 MG tablet, Take 1 tablet (10 mg total) by mouth 3 (three) times daily as needed., Disp: 30 tablet, Rfl: 0   PARoxetine (PAXIL) 10 MG tablet, Take 1 tablet (10 mg total) by mouth daily., Disp: 30 tablet, Rfl: 0   clobetasol ointment (TEMOVATE) 0.05 %, Apply 1 Application topically 2 (two) times daily., Disp: , Rfl:    ezetimibe  (ZETIA ) 10 MG tablet, Take 1 tablet (10 mg total) by mouth daily., Disp: 90 tablet, Rfl: 3   Fezolinetant (VEOZAH) 45 MG TABS, Take 1 tablet by mouth daily. (Patient not taking: Reported on 04/17/2024), Disp: , Rfl:    minoxidil (LONITEN) 2.5 MG tablet, Take 1.25 mg by mouth daily., Disp: , Rfl:    Medications ordered in this encounter:  Meds ordered this encounter  Medications   PARoxetine (PAXIL) 10 MG tablet    Sig: Take 1 tablet (10 mg total) by mouth daily.    Dispense:  30 tablet    Refill:  0    Supervising Provider:   LAMPTEY, PHILIP O [[id number]]   hydrOXYzine  (ATARAX ) 10 MG tablet    Sig: Take 1 tablet (10 mg total) by mouth 3 (three) times daily as needed.    Dispense:  30 tablet    Refill:  0    Supervising Provider:   LAMPTEY, PHILIP O [[id number]]     *If you need refills on other medications prior to your next appointment, please contact  your pharmacy*  Follow-Up: Call back or seek an in-person evaluation if the symptoms worsen or if the condition fails to improve as anticipated.  [facility] [phone]  Other Instructions  Menopause: What to Know Menopause is the time in your life when your menstrual periods stop. It marks the end of your ability to get pregnant. It can be defined as not having a period for 12 months without another medical cause. The time when you start to move into menopause is called perimenopause. It often happens between ages 67-55. It can last for many years. During perimenopause, hormone levels change in your body. This can cause symptoms and affect your health. Menopause may make you more likely to have: Bones that are weak and break more easily. Depression. This is when you feel sad or hopeless. Arteries that harden and get narrow. These can cause heart attacks and strokes. What are the causes? In most cases, menopause is a natural change to your body and hormone levels that happens as you get older. But in some cases, it may be caused by changes that aren't natural. These include: Surgery to take out both ovaries. Side effects from some medicines. What increases the risk? You're more likely to go through menopause early if: You have an abnormal growth (tumor) of the pituitary gland in your brain. You have  a disease that affects your ovaries. You've had certain treatments for cancer. These include: Chemotherapy. Hormone therapy. Radiation therapy on the area between your hips (pelvis). You smoke a lot or drink a lot of alcohol. Other people in your family have gone through menopause early. You're very thin. What are the signs or symptoms? You may have: Hot flashes. Irregular periods. Night sweats. Changes in how you feel about sex. You may: Have less of a sex drive. Feel more discomfort around your sexuality. Vaginal dryness and thinning of the vaginal walls. This  may make it hurt to have sex. Skin changes, such as: Dry skin. New wrinkles. Headaches. Other symptoms may include: Trouble sleeping. Mood swings. Memory problems. Weight gain. Hair growth on your face and chest. Bladder infections or trouble peeing. How is this diagnosed? You may be diagnosed based on: Your medical history. An exam. Your age. Your history of menstrual periods. Your symptoms. Hormone tests. How is this treated? In some cases, no treatment is needed. Talk with your health care provider about if you should get treated. Treatments may include: Menopausal hormone therapy (MHT). Medicines to treat certain symptoms. Acupuncture. Vitamin or herbal supplements. Before you start treatment, let your provider know if you or anyone in your family has or has had: Heart disease. Breast cancer. Blood clots. Diabetes. Osteoporosis. Follow these instructions at home: Eating and drinking  Eat a balanced diet. It should include: Fresh fruits and vegetables. Whole grains. Lean protein. Low-fat dairy. Eat lots of foods that have calcium  and vitamin D  in them. These can help keep your bones healthy. Foods and drinks that are rich in calcium  include: Yogurt and low-fat milk. Beans. Almonds. Sardines. Broccoli and kale. To help prevent hot flashes, stay away from: Alcohol. Drinks with caffeine in them. Spicy foods. Lifestyle Do not smoke, vape, or use nicotine or tobacco. Get 7-8 hours of sleep each night. If you have hot flashes, you may want to: Dress in layers. Avoid things that may trigger hot flashes, like warm places or stress. Take slow, deep breaths when a hot flash starts. Keep a fan in your home and office. Find ways to manage stress. You may want to try: Deep breathing. Meditation. Writing in a journal. Ask your provider about going to group therapy. Therapy can help you get support from others who are going through menopause. General  instructions  Talk with your provider before you take any herbal supplements. Keep track of your symptoms. Track: When they start. How often you have them. How long they last. Use vaginal lubricants or moisturizers. These can help with: Vaginal dryness. Comfort during sex. Contact a health care provider if: You're older than 55 and still get periods. You have pain during sex. You haven't had a period for 12 months and then start to bleed from your vagina. It hurts to pee. You get very bad headaches. Get help right away if: You're very depressed. You have a lot of bleeding from your vagina. Your heart is beating too fast. You have very bad belly pain or indigestion that doesn't go away with medicines. This information is not intended to replace advice given to you by your health care provider. Make sure you discuss any questions you have with your health care provider. Document Revised: 08/04/2023 Document Reviewed: 08/04/2023 Elsevier Patient Education  2024 Elsevier Inc.   If you have been instructed to have an in-person evaluation today at a local Urgent Care facility, please use the link below. It will take  you to a list of all of our available [facility], including address, phone number and hours of operation. Please do not delay care.  [facility]  If you or a family member do not have a primary care provider, use the link below to schedule a visit and establish care. When you choose a [facility] primary care physician or advanced practice provider, you gain a long-term partner in health. Find a Primary Care Provider  Learn more about [facility] in-office and virtual care options:  - Get Care Now

## 2024-06-21 ENCOUNTER — Ambulatory Visit (INDEPENDENT_AMBULATORY_CARE_PROVIDER_SITE_OTHER): Admitting: Family Medicine

## 2024-06-21 ENCOUNTER — Encounter: Payer: Self-pay | Admitting: Family Medicine

## 2024-06-21 VITALS — BP 100/70 | HR 87 | Temp 97.3°F | Ht 63.6 in | Wt 193.0 lb

## 2024-06-21 DIAGNOSIS — R251 Tremor, unspecified: Secondary | ICD-10-CM

## 2024-06-21 NOTE — Progress Notes (Signed)
 SABRA

## 2024-06-21 NOTE — Progress Notes (Signed)
 Patient ID: Ana Freeman, female    DOB: 21-Dec-1970, 53 y.o.   MRN: [id number]  This visit was conducted in person.  BP 100/70   Pulse 87   Temp (!) 97.3 F (36.3 C) (Temporal)   Ht 5' 3.6 (1.615 m)   Wt 193 lb (87.5 kg)   LMP 11/05/2020   SpO2 99%   BMI 33.55 kg/m    CC:  Chief Complaint  Patient presents with   Tremors    Subjective:   HPI: Ana Freeman is a 53 y.o. female presenting on 06/21/2024 for Tremors   She reports  internal tremor ongoing x  1 months.  Started with legs feeling restless. Feeling vibration in legs.. now has spread to arms and now face.  No visible tremor.  Only notes when resting... notes most when lying in bed to sleep.  No balance issue.  Feels slightly lightheaded.   Has SE to venlafaxine / wellbutrin HAs been off in [place].    Reviewed OV for menopausal symptoms. Virtual visit 1 week ago  She is feeling exteremly anxious. New start of paxil  10 mg daily ( only  taking 5 mg )  in last week for menopausal symptoms.  Has tried hydroxyzine  for anxiety...felt calmer... did not seem to help the tremors.  TAH 2021    Nml CMET, TSH No new meds/supplements  No decongestants, no weight loss supplements.  ETOH once a month, no drugs    No family history o of neuro issues.  Relevant past medical, surgical, family and social history reviewed and updated as indicated. Interim medical history since our last visit reviewed. Allergies and medications reviewed and updated. Outpatient Medications Prior to Visit  Medication Sig Dispense Refill   clobetasol ointment (TEMOVATE) 0.05 % Apply 1 Application topically 2 (two) times daily.     ezetimibe  (ZETIA ) 10 MG tablet Take 1 tablet (10 mg total) by mouth daily. 90 tablet 3   hydrOXYzine  (ATARAX ) 10 MG tablet Take 1 tablet (10 mg total) by mouth 3 (three) times daily as needed. 30 tablet 0   minoxidil (LONITEN) 2.5 MG tablet Take 1.25 mg by mouth daily.     PARoxetine  (PAXIL ) 10 MG tablet Take  1 tablet (10 mg total) by mouth daily. 30 tablet 0   Fezolinetant (VEOZAH) 45 MG TABS Take 1 tablet by mouth daily. (Patient not taking: Reported on 04/17/2024)     No facility-administered medications prior to visit.     Per HPI unless specifically indicated in ROS section below Review of Systems  Constitutional:  Negative for fatigue and fever.  HENT:  Negative for congestion.   Eyes:  Negative for pain.  Respiratory:  Negative for cough and shortness of breath.   Cardiovascular:  Negative for chest pain, palpitations and leg swelling.  Gastrointestinal:  Negative for abdominal pain.  Genitourinary:  Negative for dysuria and vaginal bleeding.  Musculoskeletal:  Negative for back pain.  Neurological:  Negative for syncope, light-headedness and headaches.  Psychiatric/Behavioral:  Positive for agitation. Negative for dysphoric mood.    Objective:  BP 100/70   Pulse 87   Temp (!) 97.3 F (36.3 C) (Temporal)   Ht 5' 3.6 (1.615 m)   Wt 193 lb (87.5 kg)   LMP 11/05/2020   SpO2 99%   BMI 33.55 kg/m   Wt Readings from Last 3 Encounters:  06/21/24 193 lb (87.5 kg)  04/17/24 190 lb (86.2 kg)  11/18/23 192 lb (87.1 kg)  Physical Exam Constitutional:      General: She is not in acute distress.    Appearance: Normal appearance. She is well-developed. She is not ill-appearing or toxic-appearing.  HENT:     Head: Normocephalic.     Right Ear: Hearing, tympanic membrane, ear canal and external ear normal. Tympanic membrane is not erythematous, retracted or bulging.     Left Ear: Hearing, tympanic membrane, ear canal and external ear normal. Tympanic membrane is not erythematous, retracted or bulging.     Nose: No mucosal edema or rhinorrhea.     Right Sinus: No maxillary sinus tenderness or frontal sinus tenderness.     Left Sinus: No maxillary sinus tenderness or frontal sinus tenderness.     Mouth/Throat:     Pharynx: Uvula midline.  Eyes:     General: Lids are normal. Lids  are everted, no foreign bodies appreciated.     Conjunctiva/sclera: Conjunctivae normal.     Pupils: Pupils are equal, round, and reactive to light.  Neck:     Thyroid : No thyroid  mass or thyromegaly.     Vascular: No carotid bruit.     Trachea: Trachea normal.  Cardiovascular:     Rate and Rhythm: Normal rate and regular rhythm.     Pulses: Normal pulses.     Heart sounds: Normal heart sounds, S1 normal and S2 normal. No murmur heard.    No friction rub. No gallop.  Pulmonary:     Effort: Pulmonary effort is normal. No tachypnea or respiratory distress.     Breath sounds: Normal breath sounds. No decreased breath sounds, wheezing, rhonchi or rales.  Abdominal:     General: Bowel sounds are normal.     Palpations: Abdomen is soft.     Tenderness: There is no abdominal tenderness.  Musculoskeletal:     Cervical back: Normal range of motion and neck supple.  Skin:    General: Skin is warm and dry.     Findings: No rash.  Neurological:     Mental Status: She is alert and oriented to person, place, and time.     GCS: GCS eye subscore is 4. GCS verbal subscore is 5. GCS motor subscore is 6.     Cranial Nerves: No cranial nerve deficit.     Sensory: No sensory deficit.     Motor: No abnormal muscle tone.     Coordination: Coordination normal.     Gait: Gait normal.     Deep Tendon Reflexes: Reflexes are normal and symmetric.     Comments: Nml cerebellar exam   No papilledema  Psychiatric:        Mood and Affect: Mood is not anxious or depressed.        Speech: Speech normal.        Behavior: Behavior normal. Behavior is cooperative.        Thought Content: Thought content normal.        Cognition and Memory: Memory is not impaired. She does not exhibit impaired recent memory or impaired remote memory.        Judgment: Judgment normal.       Results for orders placed or performed in visit on 05/28/24  CBC with Differential/Platelet   Collection Time: 05/28/24  8:51 AM   Result Value Ref Range   WBC 3.2 (L) 3.8 - 10.8 Thousand/uL   RBC 4.73 3.80 - 5.10 Million/uL   Hemoglobin 13.3 11.7 - 15.5 g/dL   HCT 58.5 64.9 - 54.9 %  MCV 87.5 80.0 - 100.0 fL   MCH 28.1 27.0 - 33.0 pg   MCHC 32.1 32.0 - 36.0 g/dL   RDW 86.6 88.9 - 84.9 %   Platelets 247 140 - 400 Thousand/uL   MPV 9.7 7.5 - 12.5 fL   Neutro Abs 1,546 1,500 - 7,800 cells/uL   Absolute Lymphocytes 1,242 850 - 3,900 cells/uL   Absolute Monocytes 314 200 - 950 cells/uL   Eosinophils Absolute 70 15 - 500 cells/uL   Basophils Absolute 29 0 - 200 cells/uL   Neutrophils Relative % 48.3 %   Total Lymphocyte 38.8 %   Monocytes Relative 9.8 %   Eosinophils Relative 2.2 %   Basophils Relative 0.9 %  TSH   Collection Time: 05/28/24  8:51 AM  Result Value Ref Range   TSH 1.42 mIU/L  Basic Metabolic Panel   Collection Time: 05/28/24  8:51 AM  Result Value Ref Range   Glucose, Bld 99 65 - 99 mg/dL   BUN 10 7 - 25 mg/dL   Creat 9.07 9.49 - 8.96 mg/dL   eGFR 75 > OR = 60 fO/fpw/8.26f7   BUN/Creatinine Ratio SEE NOTE: 6 - 22 (calc)   Sodium 142 135 - 146 mmol/L   Potassium 5.1 3.5 - 5.3 mmol/L   Chloride 108 98 - 110 mmol/L   CO2 27 20 - 32 mmol/L   Calcium  9.7 8.6 - 10.4 mg/dL    Assessment and Plan  Shakiness Assessment & Plan: Acute, patient has no visible tremors, normal neurologic exam.  No family history or personal history of movement issues/disorders. At this point I feel her anterior shakiness is most likely secondary to menopausal syndrome, hormone imbalance and associated mood changes. Lab evaluation reviewed and unremarkable. She has recently started Paxil  for menopausal syndrome and I have encouraged her to increase this to 10 mg daily.  Of note side effects to venlafaxine  and Wellbutrin. Can use hydroxyzine  as needed for anxiety. If symptoms or not improving we can always consider referral to neurology but she is agreeable to holding off at this time.     Return in about 4  weeks (around 07/19/2024) for follow up mood.   Greig Ring, MD

## 2024-06-27 DIAGNOSIS — R251 Tremor, unspecified: Secondary | ICD-10-CM | POA: Insufficient documentation

## 2024-06-27 NOTE — Assessment & Plan Note (Signed)
 Acute, patient has no visible tremors, normal neurologic exam.  No family history or personal history of movement issues/disorders. At this point I feel her anterior shakiness is most likely secondary to menopausal syndrome, hormone imbalance and associated mood changes. Lab evaluation reviewed and unremarkable. She has recently started Paxil  for menopausal syndrome and I have encouraged her to increase this to 10 mg daily.  Of note side effects to venlafaxine  and Wellbutrin. Can use hydroxyzine  as needed for anxiety. If symptoms or not improving we can always consider referral to neurology but she is agreeable to holding off at this time.

## 2024-07-09 ENCOUNTER — Other Ambulatory Visit: Payer: Self-pay | Admitting: Family Medicine

## 2024-07-09 DIAGNOSIS — Z780 Asymptomatic menopausal state: Secondary | ICD-10-CM

## 2024-07-09 DIAGNOSIS — F419 Anxiety disorder, unspecified: Secondary | ICD-10-CM

## 2024-07-09 DIAGNOSIS — R4189 Other symptoms and signs involving cognitive functions and awareness: Secondary | ICD-10-CM

## 2024-07-26 ENCOUNTER — Encounter: Payer: Self-pay | Admitting: Family Medicine

## 2024-07-26 DIAGNOSIS — R251 Tremor, unspecified: Secondary | ICD-10-CM

## 2024-07-30 DIAGNOSIS — E875 Hyperkalemia: Secondary | ICD-10-CM | POA: Diagnosis not present

## 2024-08-01 ENCOUNTER — Other Ambulatory Visit: Payer: Self-pay | Admitting: Family Medicine

## 2024-08-01 DIAGNOSIS — R4189 Other symptoms and signs involving cognitive functions and awareness: Secondary | ICD-10-CM

## 2024-08-01 DIAGNOSIS — F419 Anxiety disorder, unspecified: Secondary | ICD-10-CM

## 2024-08-01 DIAGNOSIS — Z780 Asymptomatic menopausal state: Secondary | ICD-10-CM

## 2024-08-24 MED ORDER — PAROXETINE HCL 20 MG PO TABS: 20.0000 mg | ORAL_TABLET | Freq: Every day | ORAL | 5 refills | Status: DC

## 2024-08-24 MED ORDER — ESTRADIOL 0.1 MG/GM VA CREA: TOPICAL_CREAM | VAGINAL | 12 refills | Status: AC

## 2024-08-24 NOTE — Addendum Note (Signed)
 Addended by: AVELINA NO E on: 08/24/2024 05:55 PM   Modules accepted: Orders

## 2024-09-16 ENCOUNTER — Other Ambulatory Visit: Payer: Self-pay | Admitting: Family Medicine

## 2024-10-11 ENCOUNTER — Encounter: Payer: Self-pay | Admitting: Family Medicine

## 2024-11-27 ENCOUNTER — Ambulatory Visit: Admitting: Family Medicine

## 2024-11-30 ENCOUNTER — Encounter: Payer: Self-pay | Admitting: Family Medicine

## 2024-11-30 ENCOUNTER — Ambulatory Visit: Admitting: Family Medicine

## 2024-11-30 ENCOUNTER — Ambulatory Visit: Payer: Self-pay | Admitting: Family Medicine

## 2024-11-30 VITALS — BP 90/70 | HR 83 | Temp 98.3°F | Ht 63.6 in | Wt 194.0 lb

## 2024-11-30 DIAGNOSIS — N951 Menopausal and female climacteric states: Secondary | ICD-10-CM

## 2024-11-30 DIAGNOSIS — R450 Nervousness: Secondary | ICD-10-CM | POA: Diagnosis not present

## 2024-11-30 DIAGNOSIS — E66811 Obesity, class 1: Secondary | ICD-10-CM | POA: Diagnosis not present

## 2024-11-30 DIAGNOSIS — R7303 Prediabetes: Secondary | ICD-10-CM

## 2024-11-30 LAB — IBC + FERRITIN
Ferritin: 44 ng/mL (ref 10.0–291.0)
Iron: 98 ug/dL (ref 42–145)
Saturation Ratios: 24.1 % (ref 20.0–50.0)
TIBC: 407.4 ug/dL (ref 250.0–450.0)
Transferrin: 291 mg/dL (ref 212.0–360.0)

## 2024-11-30 LAB — MAGNESIUM: Magnesium: 2.1 mg/dL (ref 1.5–2.5)

## 2024-11-30 NOTE — Assessment & Plan Note (Signed)
 Chronic, she is currently working on regular exercise and intermittent fasting. We discussed in detail weight loss medication options and she would be a good candidate for a GLP-1 medication.  She will look into whether employer covers weight loss medication.  She is unable to do the self-pay cost. If weight loss medications are not covered she can consider referral to healthy weight and wellness bariatric clinic.

## 2024-11-30 NOTE — Progress Notes (Signed)
 "   Patient ID: Ana Freeman, female    DOB: [birth year], 53 y.o.   MRN: [id number]  This visit was conducted in person.  BP 90/70   Pulse 83   Temp 98.3 F (36.8 C) (Temporal)   Ht 5' 3.6 (1.615 m)   Wt 194 lb (88 kg)   LMP 11/05/2020   SpO2 99%   BMI 33.72 kg/m    CC:  Chief Complaint  Patient presents with   Weight Loss   Tremors    Subjective:   HPI: Ana Freeman is a 53 y.o. female presenting on 11/30/2024 for Weight Loss and Tremors   Today she  notes she feels internal tremor only when at rest, sitting or lying at night.. notes mostly in legs and arms. Last until getting up and starting to move.  Cc waking with numbness in fingers, no  weakness, slurred speech.  No cramping, no creepy crawly feeling. Paxil  has not helped the tremor...  It has resolved menopausal symptoms. She has less brain fog,  energy is better, mood issues are resolved.   Estradiol  cream has helped with libido and vaginal dryness.  No caffeine intake.   She reports  internal tremor ongoing since 05/2024  Started with legs feeling restless. Feeling vibration in legs.. now has spread to arms and now face.  No visible tremor.  No balance issue.    Has SE to venlafaxine / wellbutrin.    Reviewed OV for menopausal symptoms. Reviewed visits in  06/2024  was exteremly anxious. Trial of paxil  20 mg daily for   menopausal symptoms. Using estradiol  for vaginal postmenopausal symptoms.  Has tried hydroxyzine  for anxiety...felt calmer... did not seem to help the tremors.  TAH 2021    Nml CMET, TSH No new meds/supplements  No decongestants, no weight loss supplements.  ETOH once a month, no drugs    No family history of neuro issues.   BMI33 Wt Readings from Last 3 Encounters:  11/30/24 194 lb (88 kg)  06/21/24 193 lb (87.5 kg)  04/17/24 190 lb (86.2 kg)    Relevant past medical, surgical, family and social history reviewed and updated as indicated. Interim medical history since our  last visit reviewed. Allergies and medications reviewed and updated. Outpatient Medications Prior to Visit  Medication Sig Dispense Refill   clobetasol ointment (TEMOVATE) 0.05 % Apply 1 Application topically 2 (two) times daily.     estradiol  (ESTRACE ) 0.1 MG/GM vaginal cream 0.5 g of cream intravaginally administered daily for 2 weeks, then reduce to twice weekly 42.5 g 12   ezetimibe  (ZETIA ) 10 MG tablet Take 1 tablet (10 mg total) by mouth daily. 90 tablet 3   hydrOXYzine  (ATARAX ) 10 MG tablet Take 1 tablet (10 mg total) by mouth 3 (three) times daily as needed. 30 tablet 0   minoxidil (LONITEN) 2.5 MG tablet Take 1.25 mg by mouth daily.     PARoxetine  (PAXIL ) 20 MG tablet TAKE 1 TABLET BY MOUTH EVERY DAY 90 tablet 1   No facility-administered medications prior to visit.     Per HPI unless specifically indicated in ROS section below Review of Systems  Constitutional:  Negative for fatigue and fever.  HENT:  Negative for congestion.   Eyes:  Negative for pain.  Respiratory:  Negative for cough and shortness of breath.   Cardiovascular:  Negative for chest pain, palpitations and leg swelling.  Gastrointestinal:  Negative for abdominal pain.  Genitourinary:  Negative for dysuria and vaginal bleeding.  Musculoskeletal:  Negative for back pain.  Neurological:  Negative for syncope, light-headedness and headaches.  Psychiatric/Behavioral:  Positive for agitation. Negative for dysphoric mood.    Objective:  BP 90/70   Pulse 83   Temp 98.3 F (36.8 C) (Temporal)   Ht 5' 3.6 (1.615 m)   Wt 194 lb (88 kg)   LMP 11/05/2020   SpO2 99%   BMI 33.72 kg/m   Wt Readings from Last 3 Encounters:  11/30/24 194 lb (88 kg)  06/21/24 193 lb (87.5 kg)  04/17/24 190 lb (86.2 kg)      Physical Exam Constitutional:      General: She is not in acute distress.    Appearance: Normal appearance. She is well-developed. She is not ill-appearing or toxic-appearing.  HENT:     Head: Normocephalic.      Right Ear: Hearing, tympanic membrane, ear canal and external ear normal. Tympanic membrane is not erythematous, retracted or bulging.     Left Ear: Hearing, tympanic membrane, ear canal and external ear normal. Tympanic membrane is not erythematous, retracted or bulging.     Nose: No mucosal edema or rhinorrhea.     Right Sinus: No maxillary sinus tenderness or frontal sinus tenderness.     Left Sinus: No maxillary sinus tenderness or frontal sinus tenderness.     Mouth/Throat:     Pharynx: Uvula midline.  Eyes:     General: Lids are normal. Lids are everted, no foreign bodies appreciated.     Conjunctiva/sclera: Conjunctivae normal.     Pupils: Pupils are equal, round, and reactive to light.  Neck:     Thyroid : No thyroid  mass or thyromegaly.     Vascular: No carotid bruit.     Trachea: Trachea normal.  Cardiovascular:     Rate and Rhythm: Normal rate and regular rhythm.     Pulses: Normal pulses.     Heart sounds: Normal heart sounds, S1 normal and S2 normal. No murmur heard.    No friction rub. No gallop.  Pulmonary:     Effort: Pulmonary effort is normal. No tachypnea or respiratory distress.     Breath sounds: Normal breath sounds. No decreased breath sounds, wheezing, rhonchi or rales.  Abdominal:     General: Bowel sounds are normal.     Palpations: Abdomen is soft.     Tenderness: There is no abdominal tenderness.  Musculoskeletal:     Cervical back: Normal range of motion and neck supple.  Skin:    General: Skin is warm and dry.     Findings: No rash.  Neurological:     Mental Status: She is alert and oriented to person, place, and time.     GCS: GCS eye subscore is 4. GCS verbal subscore is 5. GCS motor subscore is 6.     Cranial Nerves: No cranial nerve deficit.     Sensory: No sensory deficit.     Motor: No abnormal muscle tone.     Coordination: Coordination normal.     Gait: Gait normal.     Deep Tendon Reflexes: Reflexes are normal and symmetric.      Comments: Nml cerebellar exam   No papilledema  Psychiatric:        Mood and Affect: Mood is not anxious or depressed.        Speech: Speech normal.        Behavior: Behavior normal. Behavior is cooperative.        Thought Content: Thought content normal.  Cognition and Memory: Memory is not impaired. She does not exhibit impaired recent memory or impaired remote memory.        Judgment: Judgment normal.       Results for orders placed or performed in visit on 05/28/24  CBC with Differential/Platelet   Collection Time: 05/28/24  8:51 AM  Result Value Ref Range   WBC 3.2 (L) 3.8 - 10.8 Thousand/uL   RBC 4.73 3.80 - 5.10 Million/uL   Hemoglobin 13.3 11.7 - 15.5 g/dL   HCT 58.5 64.9 - 54.9 %   MCV 87.5 80.0 - 100.0 fL   MCH 28.1 27.0 - 33.0 pg   MCHC 32.1 32.0 - 36.0 g/dL   RDW 86.6 88.9 - 84.9 %   Platelets 247 140 - 400 Thousand/uL   MPV 9.7 7.5 - 12.5 fL   Neutro Abs 1,546 1,500 - 7,800 cells/uL   Absolute Lymphocytes 1,242 850 - 3,900 cells/uL   Absolute Monocytes 314 200 - 950 cells/uL   Eosinophils Absolute 70 15 - 500 cells/uL   Basophils Absolute 29 0 - 200 cells/uL   Neutrophils Relative % 48.3 %   Total Lymphocyte 38.8 %   Monocytes Relative 9.8 %   Eosinophils Relative 2.2 %   Basophils Relative 0.9 %  TSH   Collection Time: 05/28/24  8:51 AM  Result Value Ref Range   TSH 1.42 mIU/L  Basic Metabolic Panel   Collection Time: 05/28/24  8:51 AM  Result Value Ref Range   Glucose, Bld 99 65 - 99 mg/dL   BUN 10 7 - 25 mg/dL   Creat 9.07 9.49 - 8.96 mg/dL   eGFR 75 > OR = 60 fO/fpw/8.26f7   BUN/Creatinine Ratio SEE NOTE: 6 - 22 (calc)   Sodium 142 135 - 146 mmol/L   Potassium 5.1 3.5 - 5.3 mmol/L   Chloride 108 98 - 110 mmol/L   CO2 27 20 - 32 mmol/L   Calcium  9.7 8.6 - 10.4 mg/dL    Assessment and Plan  Jittery Assessment & Plan:  Chronic, Unfortunately no improvement with improvement of menopausal symptoms on Paxil  20 mg daily. Her symptoms sound  almost like restless leg but in multiple areas.  She notes when she is quiet and resting.  Offered trial of Requip but she is not interested in additional medication. I will check ferritin and magnesium  for possible additional cause No visible tremor.  No clear medication or supplement triggers.  No family history of movement disorder. Normal neurologic exam.  Numbness in fingertips most consistent with carpal tunnel.   Orders: -     Magnesium  -     IBC + Ferritin  Obesity (BMI 30.0-34.9) Assessment & Plan: Chronic, she is currently working on regular exercise and intermittent fasting. We discussed in detail weight loss medication options and she would be a good candidate for a GLP-1 medication.  She will look into whether employer covers weight loss medication.  She is unable to do the self-pay cost. If weight loss medications are not covered she can consider referral to healthy weight and wellness bariatric clinic.     Menopausal syndrome Assessment & Plan: Chronic, significant improvement in menopausal symptoms with Paxil  20 mg daily and estradiol  vaginal cream.      No follow-ups on file.   Greig Ring, MD   "

## 2024-11-30 NOTE — Patient Instructions (Addendum)
"   Ask Employee Human Resources about coverage of weight loss medication like Wegovy  and Zepbound.  Can try magnesium  [phone] mg later in day. "

## 2024-11-30 NOTE — Assessment & Plan Note (Signed)
 Chronic, significant improvement in menopausal symptoms with Paxil  20 mg daily and estradiol  vaginal cream.

## 2024-11-30 NOTE — Assessment & Plan Note (Signed)
"   Chronic, Unfortunately no improvement with improvement of menopausal symptoms on Paxil  20 mg daily. Her symptoms sound almost like restless leg but in multiple areas.  She notes when she is quiet and resting.  Offered trial of Requip but she is not interested in additional medication. I will check ferritin and magnesium  for possible additional cause No visible tremor.  No clear medication or supplement triggers.  No family history of movement disorder. Normal neurologic exam.  Numbness in fingertips most consistent with carpal tunnel.  "

## 2024-12-04 MED ORDER — WEGOVY 0.25 MG/0.5ML ~~LOC~~ SOAJ: 0.2500 mg | SUBCUTANEOUS | 0 refills | Status: DC

## 2024-12-04 NOTE — Telephone Encounter (Deleted)
 Dr. Avelina is out of the office this week so it may not get sent in until next week.    Ana Freeman

## 2024-12-05 ENCOUNTER — Other Ambulatory Visit (HOSPITAL_COMMUNITY): Payer: Self-pay

## 2024-12-05 ENCOUNTER — Telehealth: Payer: Self-pay | Admitting: Pharmacy Technician

## 2024-12-05 DIAGNOSIS — R7303 Prediabetes: Secondary | ICD-10-CM

## 2024-12-05 DIAGNOSIS — E66811 Obesity, class 1: Secondary | ICD-10-CM

## 2024-12-05 NOTE — Telephone Encounter (Signed)
 Pharmacy Patient Advocate Encounter   Received notification from Onbase that prior authorization for Wegovy  0.25MG /0.5ML auto-injectors is required/requested.   Insurance verification completed.   The patient is insured through [facility].   Per test claim: PA required; PA submitted to above mentioned insurance via Latent Key/confirmation #/EOC [id number] Status is pending

## 2024-12-05 NOTE — Telephone Encounter (Signed)
 Pharmacy Patient Advocate Encounter  Received notification from [employer] that Prior Authorization for Wegovy  0.25MG /0.5ML auto-injectors has been APPROVED from 12/05/2024 to 12/05/2025. Ran test claim, Copay is $24.99. This test claim was processed through [facility]- copay amounts may vary at other pharmacies due to pharmacy/plan contracts, or as the patient moves through the different stages of their insurance plan.   PA #/Case ID/Reference #: [id number]

## 2024-12-10 MED ORDER — WEGOVY 0.25 MG/0.5ML ~~LOC~~ SOAJ: 0.2500 mg | SUBCUTANEOUS | 0 refills | Status: DC

## 2024-12-10 MED ORDER — WEGOVY 0.5 MG/0.5ML ~~LOC~~ SOAJ: 0.5000 mg | SUBCUTANEOUS | 0 refills | Status: DC

## 2024-12-10 NOTE — Addendum Note (Signed)
 Addended by: WENDELL ARLAND RAMAN on: 12/10/2024 03:26 PM   Modules accepted: Orders

## 2024-12-10 NOTE — Addendum Note (Signed)
 Addended by: WENDELL ARLAND RAMAN on: 12/10/2024 10:39 AM   Modules accepted: Orders

## 2024-12-12 ENCOUNTER — Other Ambulatory Visit: Payer: Self-pay

## 2024-12-12 DIAGNOSIS — Z5321 Procedure and treatment not carried out due to patient leaving prior to being seen by health care provider: Secondary | ICD-10-CM | POA: Diagnosis not present

## 2024-12-12 DIAGNOSIS — R202 Paresthesia of skin: Secondary | ICD-10-CM | POA: Insufficient documentation

## 2024-12-12 DIAGNOSIS — R000 Tachycardia, unspecified: Secondary | ICD-10-CM | POA: Diagnosis not present

## 2024-12-12 DIAGNOSIS — R55 Syncope and collapse: Secondary | ICD-10-CM | POA: Diagnosis not present

## 2024-12-12 NOTE — ED Triage Notes (Signed)
 Pt arrives via EMS from home; took 2 bites of a THC gummie at 8pm; began to feel restless and tingling

## 2024-12-13 ENCOUNTER — Emergency Department
Admission: EM | Admit: 2024-12-13 | Discharge: 2024-12-13 | Disposition: A | Discharge status: Home / Self Care | Attending: Emergency Medicine | Admitting: Emergency Medicine

## 2024-12-13 NOTE — ED Provider Notes (Signed)
----------------------------------------- °  12:32 AM on 12/13/2024 -----------------------------------------  The patient was brought to a room at the same time as 3 other patients, whom I saw in order of acuity.  Unfortunately this patient decided to leave without being seen by me within about 20 minutes of her arrival, before I could get to her.  She was visualized walking out with a family member in no apparent distress.   Gordan Huxley, MD 12/13/24 [phone]

## 2024-12-26 ENCOUNTER — Encounter: Payer: Self-pay | Admitting: Family Medicine

## 2024-12-26 DIAGNOSIS — R7303 Prediabetes: Secondary | ICD-10-CM

## 2024-12-26 DIAGNOSIS — E66811 Obesity, class 1: Secondary | ICD-10-CM

## 2024-12-27 MED ORDER — TRAZODONE HCL 50 MG PO TABS: 25.0000 mg | ORAL_TABLET | Freq: Every evening | ORAL | 3 refills | Status: AC | PRN

## 2025-01-01 ENCOUNTER — Other Ambulatory Visit: Payer: Self-pay | Admitting: Family Medicine

## 2025-01-01 DIAGNOSIS — E7849 Other hyperlipidemia: Secondary | ICD-10-CM

## 2025-01-01 DIAGNOSIS — R7303 Prediabetes: Secondary | ICD-10-CM

## 2025-01-01 MED ORDER — EZETIMIBE 10 MG PO TABS: 10.0000 mg | ORAL_TABLET | Freq: Every day | ORAL | 0 refills | Status: AC

## 2025-01-01 NOTE — Telephone Encounter (Signed)
 Please call and schedule CPE with fasting labs prior with Dr. Ermalene Searing.

## 2025-01-03 MED ORDER — WEGOVY 0.5 MG/0.5ML ~~LOC~~ SOAJ: 0.5000 mg | SUBCUTANEOUS | 0 refills | Status: DC

## 2025-01-03 NOTE — Addendum Note (Signed)
 Addended by: WENDELL ARLAND RAMAN on: 01/03/2025 08:49 AM   Modules accepted: Orders

## 2025-01-03 NOTE — Telephone Encounter (Signed)
 Okay to send in 3 month supply?

## 2025-01-10 NOTE — Telephone Encounter (Signed)
 lvm for pt to call office to schedule appt.

## 2025-01-11 ENCOUNTER — Ambulatory Visit: Admitting: Family Medicine

## 2025-01-11 ENCOUNTER — Encounter: Payer: Self-pay | Admitting: Family Medicine

## 2025-01-11 VITALS — BP 100/70 | HR 83 | Temp 97.6°F | Ht 63.5 in | Wt 187.1 lb

## 2025-01-11 DIAGNOSIS — Z0000 Encounter for general adult medical examination without abnormal findings: Secondary | ICD-10-CM

## 2025-01-11 DIAGNOSIS — E7849 Other hyperlipidemia: Secondary | ICD-10-CM | POA: Diagnosis not present

## 2025-01-11 DIAGNOSIS — E210 Primary hyperparathyroidism: Secondary | ICD-10-CM

## 2025-01-11 DIAGNOSIS — R7303 Prediabetes: Secondary | ICD-10-CM | POA: Diagnosis not present

## 2025-01-11 DIAGNOSIS — E7800 Pure hypercholesterolemia, unspecified: Secondary | ICD-10-CM

## 2025-01-11 DIAGNOSIS — E66811 Obesity, class 1: Secondary | ICD-10-CM | POA: Diagnosis not present

## 2025-01-11 DIAGNOSIS — Z862 Personal history of diseases of the blood and blood-forming organs and certain disorders involving the immune mechanism: Secondary | ICD-10-CM | POA: Diagnosis not present

## 2025-01-11 MED ORDER — WEGOVY 0.5 MG/0.5ML ~~LOC~~ SOAJ: 0.5000 mg | SUBCUTANEOUS | 0 refills | Status: AC

## 2025-01-11 NOTE — Assessment & Plan Note (Signed)
 Due for re-eval.

## 2025-01-11 NOTE — Assessment & Plan Note (Addendum)
"   Due for re-eval.  Off statin as it cause SE. "

## 2025-01-11 NOTE — Assessment & Plan Note (Signed)
 Chronic, she is currently working on regular exercise and intermittent fasting.  Now on wegovy  0.5 mg weekly

## 2025-01-11 NOTE — Assessment & Plan Note (Signed)
Iron panel normal.

## 2025-01-11 NOTE — Assessment & Plan Note (Signed)
Followed by ENDO 

## 2025-01-12 LAB — COMPREHENSIVE METABOLIC PANEL WITH GFR
AG Ratio: 1.7 (calc) (ref 1.0–2.5)
ALT: 22 U/L (ref 6–29)
AST: 18 U/L (ref 10–35)
Albumin: 4.7 g/dL (ref 3.6–5.1)
Alkaline phosphatase (APISO): 79 U/L (ref 37–153)
BUN: 10 mg/dL (ref 7–25)
CO2: 28 mmol/L (ref 20–32)
Calcium: 9.7 mg/dL (ref 8.6–10.4)
Chloride: 104 mmol/L (ref 98–110)
Creat: 0.94 mg/dL (ref 0.50–1.03)
Globulin: 2.8 g/dL (ref 1.9–3.7)
Glucose, Bld: 83 mg/dL (ref 65–99)
Potassium: 5 mmol/L (ref 3.5–5.3)
Sodium: 139 mmol/L (ref 135–146)
Total Bilirubin: 0.5 mg/dL (ref 0.2–1.2)
Total Protein: 7.5 g/dL (ref 6.1–8.1)
eGFR: 73 mL/min/{1.73_m2}

## 2025-01-12 LAB — LIPID PANEL
Cholesterol: 211 mg/dL — ABNORMAL HIGH
HDL: 58 mg/dL
LDL Cholesterol (Calc): 139 mg/dL — ABNORMAL HIGH
Non-HDL Cholesterol (Calc): 153 mg/dL — ABNORMAL HIGH
Total CHOL/HDL Ratio: 3.6 (calc)
Triglycerides: 52 mg/dL

## 2025-01-12 LAB — HEMOGLOBIN A1C
Hgb A1c MFr Bld: 5.3 %
Mean Plasma Glucose: 105 mg/dL
eAG (mmol/L): 5.8 mmol/L

## 2025-01-16 ENCOUNTER — Ambulatory Visit: Payer: Self-pay | Admitting: Family Medicine
